# Patient Record
Sex: Male | Born: 1945
Health system: Southern US, Community
[De-identification: ages and names within clinical notes are randomized; demographics above are authoritative.]

## PROBLEM LIST (undated history)

## (undated) DIAGNOSIS — N529 Male erectile dysfunction, unspecified: Secondary | ICD-10-CM

## (undated) DIAGNOSIS — T8859XA Other complications of anesthesia, initial encounter: Secondary | ICD-10-CM

## (undated) DIAGNOSIS — E119 Type 2 diabetes mellitus without complications: Secondary | ICD-10-CM

## (undated) DIAGNOSIS — H548 Legal blindness, as defined in USA: Secondary | ICD-10-CM

## (undated) DIAGNOSIS — F32A Depression, unspecified: Secondary | ICD-10-CM

## (undated) DIAGNOSIS — K589 Irritable bowel syndrome without diarrhea: Secondary | ICD-10-CM

## (undated) DIAGNOSIS — T4145XA Adverse effect of unspecified anesthetic, initial encounter: Secondary | ICD-10-CM

## (undated) DIAGNOSIS — I10 Essential (primary) hypertension: Secondary | ICD-10-CM

## (undated) DIAGNOSIS — R3912 Poor urinary stream: Secondary | ICD-10-CM

## (undated) DIAGNOSIS — Z8549 Personal history of malignant neoplasm of other male genital organs: Secondary | ICD-10-CM

## (undated) DIAGNOSIS — N4289 Other specified disorders of prostate: Secondary | ICD-10-CM

## (undated) DIAGNOSIS — F329 Major depressive disorder, single episode, unspecified: Secondary | ICD-10-CM

## (undated) DIAGNOSIS — F319 Bipolar disorder, unspecified: Secondary | ICD-10-CM

## (undated) DIAGNOSIS — F419 Anxiety disorder, unspecified: Secondary | ICD-10-CM

## (undated) DIAGNOSIS — H9190 Unspecified hearing loss, unspecified ear: Secondary | ICD-10-CM

## (undated) DIAGNOSIS — N401 Enlarged prostate with lower urinary tract symptoms: Secondary | ICD-10-CM

## (undated) HISTORY — PX: TOTAL KNEE ARTHROPLASTY: SHX125

## (undated) HISTORY — PX: NASAL SEPTUM SURGERY: SHX37

## (undated) HISTORY — PX: CHOLECYSTECTOMY: SHX55

## (undated) HISTORY — PX: SHOULDER ARTHROSCOPY WITH ROTATOR CUFF REPAIR: SHX5685

## (undated) HISTORY — PX: CERVICAL FUSION: SHX112

---

## 2005-02-26 HISTORY — PX: CATARACT EXTRACTION W/ INTRAOCULAR LENS  IMPLANT, BILATERAL: SHX1307

## 2011-02-27 HISTORY — PX: OTHER SURGICAL HISTORY: SHX169

## 2014-08-10 ENCOUNTER — Encounter (HOSPITAL_COMMUNITY): Payer: Self-pay | Admitting: *Deleted

## 2014-08-10 ENCOUNTER — Emergency Department (HOSPITAL_COMMUNITY): Payer: Medicare PPO

## 2014-08-10 ENCOUNTER — Inpatient Hospital Stay (HOSPITAL_COMMUNITY)
Admission: EM | Admit: 2014-08-10 | Discharge: 2014-08-18 | DRG: 853 | Disposition: A | Discharge status: Home / Self Care | Payer: Medicare PPO | Attending: Surgery | Admitting: Surgery

## 2014-08-10 DIAGNOSIS — D62 Acute posthemorrhagic anemia: Secondary | ICD-10-CM | POA: Diagnosis not present

## 2014-08-10 DIAGNOSIS — F319 Bipolar disorder, unspecified: Secondary | ICD-10-CM | POA: Diagnosis present

## 2014-08-10 DIAGNOSIS — I471 Supraventricular tachycardia: Secondary | ICD-10-CM | POA: Diagnosis not present

## 2014-08-10 DIAGNOSIS — J869 Pyothorax without fistula: Secondary | ICD-10-CM | POA: Diagnosis present

## 2014-08-10 DIAGNOSIS — J90 Pleural effusion, not elsewhere classified: Secondary | ICD-10-CM | POA: Diagnosis present

## 2014-08-10 DIAGNOSIS — J189 Pneumonia, unspecified organism: Secondary | ICD-10-CM | POA: Diagnosis present

## 2014-08-10 DIAGNOSIS — Z91041 Radiographic dye allergy status: Secondary | ICD-10-CM

## 2014-08-10 DIAGNOSIS — G8929 Other chronic pain: Secondary | ICD-10-CM | POA: Diagnosis present

## 2014-08-10 DIAGNOSIS — Z9889 Other specified postprocedural states: Secondary | ICD-10-CM

## 2014-08-10 DIAGNOSIS — I10 Essential (primary) hypertension: Secondary | ICD-10-CM | POA: Diagnosis present

## 2014-08-10 DIAGNOSIS — E222 Syndrome of inappropriate secretion of antidiuretic hormone: Secondary | ICD-10-CM | POA: Diagnosis present

## 2014-08-10 DIAGNOSIS — Z888 Allergy status to other drugs, medicaments and biological substances status: Secondary | ICD-10-CM | POA: Diagnosis not present

## 2014-08-10 DIAGNOSIS — G9340 Encephalopathy, unspecified: Secondary | ICD-10-CM | POA: Diagnosis present

## 2014-08-10 DIAGNOSIS — A419 Sepsis, unspecified organism: Secondary | ICD-10-CM | POA: Diagnosis present

## 2014-08-10 DIAGNOSIS — E11649 Type 2 diabetes mellitus with hypoglycemia without coma: Secondary | ICD-10-CM | POA: Diagnosis not present

## 2014-08-10 DIAGNOSIS — R109 Unspecified abdominal pain: Secondary | ICD-10-CM

## 2014-08-10 DIAGNOSIS — M25512 Pain in left shoulder: Secondary | ICD-10-CM | POA: Diagnosis not present

## 2014-08-10 DIAGNOSIS — J948 Other specified pleural conditions: Secondary | ICD-10-CM | POA: Diagnosis not present

## 2014-08-10 DIAGNOSIS — Z87891 Personal history of nicotine dependence: Secondary | ICD-10-CM | POA: Diagnosis not present

## 2014-08-10 DIAGNOSIS — G629 Polyneuropathy, unspecified: Secondary | ICD-10-CM | POA: Diagnosis present

## 2014-08-10 DIAGNOSIS — E119 Type 2 diabetes mellitus without complications: Secondary | ICD-10-CM | POA: Diagnosis not present

## 2014-08-10 DIAGNOSIS — Z9689 Presence of other specified functional implants: Secondary | ICD-10-CM

## 2014-08-10 DIAGNOSIS — Z79899 Other long term (current) drug therapy: Secondary | ICD-10-CM | POA: Diagnosis not present

## 2014-08-10 DIAGNOSIS — E1165 Type 2 diabetes mellitus with hyperglycemia: Secondary | ICD-10-CM | POA: Diagnosis present

## 2014-08-10 DIAGNOSIS — I4891 Unspecified atrial fibrillation: Secondary | ICD-10-CM | POA: Diagnosis present

## 2014-08-10 HISTORY — DX: Essential (primary) hypertension: I10

## 2014-08-10 HISTORY — DX: Bipolar disorder, unspecified: F31.9

## 2014-08-10 LAB — HEPATIC FUNCTION PANEL
ALBUMIN: 2.8 g/dL — AB (ref 3.5–5.0)
ALT: 20 U/L (ref 17–63)
AST: 16 U/L (ref 15–41)
Alkaline Phosphatase: 109 U/L (ref 38–126)
BILIRUBIN TOTAL: 0.5 mg/dL (ref 0.3–1.2)
Bilirubin, Direct: 0.3 mg/dL (ref 0.1–0.5)
Indirect Bilirubin: 0.2 mg/dL — ABNORMAL LOW (ref 0.3–0.9)
TOTAL PROTEIN: 6.6 g/dL (ref 6.5–8.1)

## 2014-08-10 LAB — BASIC METABOLIC PANEL
Anion gap: 10 (ref 5–15)
BUN: 22 mg/dL — AB (ref 6–20)
CHLORIDE: 96 mmol/L — AB (ref 101–111)
CO2: 20 mmol/L — ABNORMAL LOW (ref 22–32)
Calcium: 9 mg/dL (ref 8.9–10.3)
Creatinine, Ser: 1.11 mg/dL (ref 0.61–1.24)
GFR calc non Af Amer: >60 mL/min (ref >60)
Glucose, Bld: 408 mg/dL — ABNORMAL HIGH (ref 65–99)
POTASSIUM: 4.6 mmol/L (ref 3.5–5.1)
SODIUM: 126 mmol/L — AB (ref 135–145)

## 2014-08-10 LAB — I-STAT TROPONIN, ED: Troponin i, poc: 0.01 ng/mL (ref 0.00–0.08)

## 2014-08-10 LAB — CBC
HCT: 30.7 % — ABNORMAL LOW (ref 39.0–52.0)
Hemoglobin: 10.7 g/dL — ABNORMAL LOW (ref 13.0–17.0)
MCH: 28.6 pg (ref 26.0–34.0)
MCHC: 34.9 g/dL (ref 30.0–36.0)
MCV: 82.1 fL (ref 78.0–100.0)
PLATELETS: 300 10*3/uL (ref 150–400)
RBC: 3.74 MIL/uL — ABNORMAL LOW (ref 4.22–5.81)
RDW: 12.6 % (ref 11.5–15.5)
WBC: 27.4 10*3/uL — AB (ref 4.0–10.5)

## 2014-08-10 LAB — STREP PNEUMONIAE URINARY ANTIGEN: Strep Pneumo Urinary Antigen: NEGATIVE

## 2014-08-10 LAB — LIPASE, BLOOD: Lipase: 14 U/L — ABNORMAL LOW (ref 22–51)

## 2014-08-10 LAB — BRAIN NATRIURETIC PEPTIDE: B Natriuretic Peptide: 198.1 pg/mL — ABNORMAL HIGH (ref 0.0–100.0)

## 2014-08-10 LAB — CBG MONITORING, ED
GLUCOSE-CAPILLARY: 350 mg/dL — AB (ref 65–99)
GLUCOSE-CAPILLARY: 384 mg/dL — AB (ref 65–99)

## 2014-08-10 LAB — LACTIC ACID, PLASMA: Lactic Acid, Venous: 1.2 mmol/L (ref 0.5–2.0)

## 2014-08-10 LAB — GLUCOSE, CAPILLARY: Glucose-Capillary: 375 mg/dL — ABNORMAL HIGH (ref 65–99)

## 2014-08-10 MED ORDER — ACETAMINOPHEN 325 MG PO TABS
650.0000 mg | ORAL_TABLET | Freq: Four times a day (QID) | ORAL | Status: DC | PRN
  Administered 2014-08-11 (×2): 650 mg via ORAL
  Filled 2014-08-10 (×2): qty 2

## 2014-08-10 MED ORDER — DEXTROSE 5 % IV SOLN
500.0000 mg | Freq: Once | INTRAVENOUS | Status: AC
  Administered 2014-08-10: 500 mg via INTRAVENOUS
  Filled 2014-08-10: qty 500

## 2014-08-10 MED ORDER — DEXTROSE 5 % IV SOLN
1.0000 g | Freq: Once | INTRAVENOUS | Status: AC
  Administered 2014-08-10: 1 g via INTRAVENOUS
  Filled 2014-08-10: qty 10

## 2014-08-10 MED ORDER — ONDANSETRON HCL 4 MG/2ML IJ SOLN: 4.0000 mg | Freq: Four times a day (QID) | INTRAMUSCULAR | Status: DC | PRN

## 2014-08-10 MED ORDER — GABAPENTIN 300 MG PO CAPS: 300.0000 mg | ORAL_CAPSULE | Freq: Two times a day (BID) | ORAL | Status: DC

## 2014-08-10 MED ORDER — ONDANSETRON HCL 4 MG PO TABS: 4.0000 mg | ORAL_TABLET | Freq: Four times a day (QID) | ORAL | Status: DC | PRN

## 2014-08-10 MED ORDER — ALBUTEROL SULFATE (2.5 MG/3ML) 0.083% IN NEBU
2.5000 mg | INHALATION_SOLUTION | Freq: Four times a day (QID) | RESPIRATORY_TRACT | Status: DC | PRN
  Administered 2014-08-12: 2.5 mg via RESPIRATORY_TRACT
  Filled 2014-08-10: qty 3

## 2014-08-10 MED ORDER — HYDROMORPHONE HCL 1 MG/ML IJ SOLN
1.0000 mg | Freq: Once | INTRAMUSCULAR | Status: AC
  Administered 2014-08-10: 1 mg via INTRAVENOUS
  Filled 2014-08-10: qty 1

## 2014-08-10 MED ORDER — TIZANIDINE HCL 2 MG PO TABS
2.0000 mg | ORAL_TABLET | Freq: Three times a day (TID) | ORAL | Status: DC | PRN
  Administered 2014-08-15: 2 mg via ORAL
  Filled 2014-08-10 (×4): qty 2

## 2014-08-10 MED ORDER — GABAPENTIN 300 MG PO CAPS
300.0000 mg | ORAL_CAPSULE | Freq: Every day | ORAL | Status: DC
  Administered 2014-08-11 – 2014-08-18 (×7): 300 mg via ORAL
  Filled 2014-08-10 (×7): qty 1

## 2014-08-10 MED ORDER — DEXTROSE 5 % IV SOLN: 500.0000 mg | INTRAVENOUS | Status: DC

## 2014-08-10 MED ORDER — INSULIN ASPART 100 UNIT/ML ~~LOC~~ SOLN
0.0000 [IU] | Freq: Three times a day (TID) | SUBCUTANEOUS | Status: DC
  Administered 2014-08-11 (×2): 5 [IU] via SUBCUTANEOUS

## 2014-08-10 MED ORDER — GABAPENTIN 300 MG PO CAPS
600.0000 mg | ORAL_CAPSULE | Freq: Every day | ORAL | Status: DC
  Administered 2014-08-10 – 2014-08-17 (×7): 600 mg via ORAL
  Filled 2014-08-10 (×9): qty 2

## 2014-08-10 MED ORDER — SODIUM CHLORIDE 0.9 % IV BOLUS (SEPSIS)
1000.0000 mL | Freq: Once | INTRAVENOUS | Status: AC
  Administered 2014-08-10: 1000 mL via INTRAVENOUS

## 2014-08-10 MED ORDER — MORPHINE SULFATE 2 MG/ML IJ SOLN: 2.0000 mg | INTRAMUSCULAR | Status: DC | PRN

## 2014-08-10 MED ORDER — ACETAMINOPHEN 650 MG RE SUPP: 650.0000 mg | Freq: Four times a day (QID) | RECTAL | Status: DC | PRN

## 2014-08-10 MED ORDER — VENLAFAXINE HCL ER 150 MG PO CP24
300.0000 mg | ORAL_CAPSULE | Freq: Every day | ORAL | Status: DC
  Administered 2014-08-10 – 2014-08-17 (×7): 300 mg via ORAL
  Filled 2014-08-10 (×10): qty 2

## 2014-08-10 MED ORDER — DEXTROSE 5 % IV SOLN: 1.0000 g | INTRAVENOUS | Status: DC

## 2014-08-10 MED ORDER — OXYCODONE HCL ER 20 MG PO T12A
60.0000 mg | EXTENDED_RELEASE_TABLET | Freq: Two times a day (BID) | ORAL | Status: DC
Start: 2014-08-10 — End: 2014-08-12
  Administered 2014-08-10 – 2014-08-11 (×3): 60 mg via ORAL
  Filled 2014-08-10 (×3): qty 6

## 2014-08-10 MED ORDER — ALBUTEROL SULFATE HFA 108 (90 BASE) MCG/ACT IN AERS: 2.0000 | INHALATION_SPRAY | Freq: Four times a day (QID) | RESPIRATORY_TRACT | Status: DC | PRN

## 2014-08-10 MED ORDER — ACETAMINOPHEN 325 MG PO TABS
650.0000 mg | ORAL_TABLET | Freq: Once | ORAL | Status: AC
  Administered 2014-08-10: 650 mg via ORAL
  Filled 2014-08-10: qty 2

## 2014-08-10 MED ORDER — LOSARTAN POTASSIUM 25 MG PO TABS
25.0000 mg | ORAL_TABLET | Freq: Every day | ORAL | Status: DC
  Administered 2014-08-10 – 2014-08-11 (×2): 25 mg via ORAL
  Filled 2014-08-10 (×4): qty 1

## 2014-08-10 MED ORDER — INSULIN ASPART 100 UNIT/ML ~~LOC~~ SOLN
0.0000 [IU] | Freq: Every day | SUBCUTANEOUS | Status: DC
  Administered 2014-08-10: 5 [IU] via SUBCUTANEOUS
  Administered 2014-08-11: 2 [IU] via SUBCUTANEOUS

## 2014-08-10 MED ORDER — LAMOTRIGINE 200 MG PO TABS
200.0000 mg | ORAL_TABLET | Freq: Every day | ORAL | Status: DC
  Administered 2014-08-10 – 2014-08-17 (×7): 200 mg via ORAL
  Filled 2014-08-10 (×8): qty 1
  Filled 2014-08-10 (×2): qty 2

## 2014-08-10 MED ORDER — DEXTROSE 5 % IV SOLN
500.0000 mg | INTRAVENOUS | Status: DC
  Filled 2014-08-10: qty 500

## 2014-08-10 MED ORDER — METOPROLOL TARTRATE 25 MG PO TABS
25.0000 mg | ORAL_TABLET | Freq: Every day | ORAL | Status: DC
  Administered 2014-08-10 – 2014-08-11 (×2): 25 mg via ORAL
  Filled 2014-08-10 (×3): qty 1

## 2014-08-10 MED ORDER — DEXTROSE 5 % IV SOLN
1.0000 g | INTRAVENOUS | Status: DC
  Filled 2014-08-10: qty 10

## 2014-08-10 NOTE — ED Notes (Signed)
Patient in xray I will collect labs when he returns.

## 2014-08-10 NOTE — ED Notes (Signed)
Per EMS pt coming from home with c/o chest discomfort and cough. Pt sts it's been going on for about 2 weeks, and it hurts to cough. Pt was given 4 aspirin en route, pt's cbg was 403, per EMS pt unsure if he is taking his medications as prescribed.

## 2014-08-10 NOTE — ED Notes (Signed)
Bed: YY48 Expected date:  Expected time:  Means of arrival:  Comments: EMS- chest discomfort

## 2014-08-10 NOTE — Progress Notes (Signed)
Pt confirmed with cm he has [employer] coverage and Dr Abigail Miyamoto is his pcp

## 2014-08-10 NOTE — H&P (Addendum)
[facility] History and Physical  Dwayne Vargas EAV:[id number] DOB: Nov 23, 1945 DOA: 08/10/2014  Referring physician: Emergency Department PCP: Irving Copas, MD  Specialists:   Chief Complaint: SOB  HPI: Dwayne Vargas is a 69 y.o. male  With a hx of DM, bipolar, HTN, who presents to ED with complaints of sob, L sided CP as well as fevers over the past week. In the ED, the patient was found to have evidence of L sided pneumonia with large effusion on imaging. The patient was started on empiric rocephin and azithromycin and hospitalist consulted for consideration for admission.  Review of Systems:   Review of Systems  Constitutional: Positive for fever. Negative for malaise/fatigue and diaphoresis.  HENT: Negative for ear pain and nosebleeds.   Eyes: Negative for double vision and discharge.  Respiratory: Positive for cough and shortness of breath.   Cardiovascular: Positive for chest pain. Negative for palpitations and claudication.  Gastrointestinal: Positive for abdominal pain. Negative for nausea and blood in stool.  Genitourinary: Negative for frequency and flank pain.  Musculoskeletal: Negative for falls and neck pain.  Neurological: Negative for tremors, seizures, loss of consciousness and headaches.  Psychiatric/Behavioral: Negative for hallucinations. The patient is not nervous/anxious.      Past Medical History  Diagnosis Date  . Diabetes mellitus without complication   . Bipolar 1 disorder   . Hypertension    Past Surgical History  Procedure Laterality Date  . Total knee arthroplasty    . Shoulder surgery    . Cholecystectomy    . Fracture surgery    . Joint replacement     Social History:  reports that he quit smoking about 3 months ago. His smoking use included Cigarettes. He has never used smokeless tobacco. He reports that he does not drink alcohol or use illicit drugs.  where does patient live--home, ALF, SNF? and with whom if at home?  Can patient  participate in ADLs?  Allergies  Allergen Reactions  . Iodine Hives and Swelling    History reviewed. No pertinent family history. Father with renal failure   Prior to Admission medications   Medication Sig Start Date End Date Taking? Authorizing Provider  gabapentin (NEURONTIN) 300 MG capsule Take 300-600 mg by mouth 2 (two) times daily. 300 mg every morning and 600 mg every night 07/28/14  Yes Historical Provider, MD  lamoTRIgine (LAMICTAL) 200 MG tablet Take 200 mg by mouth at bedtime. 07/29/14  Yes Historical Provider, MD  losartan (COZAAR) 25 MG tablet Take 25 mg by mouth daily. 08/10/14  Yes Historical Provider, MD  lovastatin (MEVACOR) 10 MG tablet Take 10 mg by mouth daily. 08/10/14  Yes Historical Provider, MD  metFORMIN (GLUCOPHAGE) 500 MG tablet Take 500 mg by mouth 2 (two) times daily. 08/10/14  Yes Historical Provider, MD  metoprolol tartrate (LOPRESSOR) 25 MG tablet Take 25 mg by mouth daily. 08/10/14  Yes Historical Provider, MD  OXYCONTIN 30 MG T12A Take 60 mg by mouth every 12 (twelve) hours as needed (pain).  07/20/14  Yes Historical Provider, MD  PROAIR HFA 108 (90 BASE) MCG/ACT inhaler Inhale 2 puffs into the lungs 4 (four) times daily as needed for wheezing or shortness of breath.  08/08/14  Yes Historical Provider, MD  tiZANidine (ZANAFLEX) 2 MG tablet Take 2-4 mg by mouth 3 (three) times daily as needed for muscle spasms.  07/28/14  Yes Historical Provider, MD  venlafaxine XR (EFFEXOR-XR) 150 MG 24 hr capsule Take 300 mg by mouth daily. 07/29/14  Yes Historical  Provider, MD   Physical Exam: Filed Vitals:   08/10/14 1330 08/10/14 1430 08/10/14 1432 08/10/14 1500  BP: 108/48 111/51  125/60  Pulse: 98 98  94  Temp:   99.9 F (37.7 C)   TempSrc:   Oral   Resp: SpO2: 92% 92%  93%     General:  Awake, in nad  Eyes: PERRL B  ENT: membranes moist, dentition fair  Neck: trachea midline, neck supple  Cardiovascular: regular, s1, s2  Respiratory: normal resp  effort, no wheezing  Abdomen: soft,nondistended  Skin: normal skin turgor, no abnormal skin lesions seen  Musculoskeletal: perfused, no clubbing  Psychiatric: mood/affect normal // no auditory/visual hallucinations   Neurologic: cn2-12 grossly intact, strength/sensation intact  Labs on Admission:  Basic Metabolic Panel:  Recent Labs Lab 08/10/14 1328  NA 126*  Vargas 4.6  CL 96*  CO2 20*  GLUCOSE 408*  BUN 22*  CREATININE 1.11  CALCIUM 9.0   Liver Function Tests:  Recent Labs Lab 08/10/14 1435  AST 16  ALT 20  ALKPHOS 109  BILITOT 0.5  PROT 6.6  ALBUMIN 2.8*    Recent Labs Lab 08/10/14 1435  LIPASE 14*   No results for input(s): AMMONIA in the last 168 hours. CBC:  Recent Labs Lab 08/10/14 1328  WBC 27.4*  HGB 10.7*  HCT 30.7*  MCV 82.1  PLT 300   Cardiac Enzymes: No results for input(s): CKTOTAL, CKMB, CKMBINDEX, TROPONINI in the last 168 hours.  BNP (last 3 results)  Recent Labs  08/10/14 1320  BNP 198.1*    ProBNP (last 3 results) No results for input(s): PROBNP in the last 8760 hours.  CBG: No results for input(s): GLUCAP in the last 168 hours.  Radiological Exams on Admission: Ct Abdomen Pelvis Wo Contrast  08/10/2014   CLINICAL DATA:  69 year old male with chest pain cough and left upper quadrant pain. Initial encounter.  EXAM: CT ABDOMEN AND PELVIS WITHOUT CONTRAST  TECHNIQUE: Multidetector CT imaging of the abdomen and pelvis was performed following the standard protocol without IV contrast.  COMPARISON:  Chest radiographs today reported separately.  FINDINGS: Partially visible left lower lobe and lingula consolidation with air bronchograms and superimposed pleural effusion. The effusion extends laterally around the compressed lung parenchyma. No pericardial effusion. No definite cavitary changes.  Mild curvilinear opacity in the right medial lung base, azygoesophageal recess. No right pleural effusion.  No acute or suspicious osseous  lesion identified.  No pelvic free fluid. Mildly distended bladder. Negative distal colon.  Redundant sigmoid colon mostly containing gas. Retained stool in the left colon and transverse colon. Retained stool at the hepatic flexure and ascending colon. Negative appendix and terminal ileum. No dilated small bowel. Oral contrast has not yet reached the distal small bowel. Negative stomach and duodenum.  Surgically absent gallbladder. Non contrast liver, spleen, pancreas and adrenal glands are within normal limits. No abdominal free fluid. No hydronephrosis or hydroureter. Punctate left lower pole renal calculus. Mild nonspecific perinephric stranding. Right lower pole exophytic cyst with simple fluid densitometry. Aortoiliac calcified atherosclerosis noted. No lymphadenopathy in the abdomen or pelvis.  IMPRESSION: 1. Abnormal visualized left lower lobe and lingula with a combination of multilobar consolidation, and also compression of the left lung by a complex or loculated pleural effusion. This might all be related to pneumonia, but obstructing left hilar lung mass is also a consideration. Chest CT with IV contrast is recommended when possible to further characterize. 2. No acute or  inflammatory findings in the abdomen or pelvis.   Electronically Signed   By: Odessa Fleming M.D.   On: 08/10/2014 16:07   Dg Chest 2 View  08/10/2014   CLINICAL DATA:  Chest discomfort and cough for approximately 2 weeks. Elevated white blood cell count.  EXAM: CHEST  2 VIEW  COMPARISON:  None.  FINDINGS: The patient has a moderate to moderately large left pleural effusion with associated left basilar airspace disease. The right lung appears clear. No pneumothorax is identified. Heart size is normal.  IMPRESSION: Moderate to moderately large left pleural effusion with left basilar airspace disease worrisome for pneumonia given elevated white blood cell count. Follow-up to clearing is recommended.   Electronically Signed   By: Drusilla Kanner M.D.   On: 08/10/2014 13:51   Assessment/Plan Principal Problem:   Community acquired pneumonia Active Problems:   Diabetes mellitus without complication   Pleural effusion   HTN (hypertension)  1. CAP with largepleural effusion with sepsos 1. Elevated WBC of 27k with fevers to 101F in the setting of pneumonia 2. Lactate normal 3. Cont on empiric azithromycin and rocephin 4. Cont O2 as needed 5. F/u CT chest official results, however per my own read, L sided large effusion seen 6. Will request IR guided diagnostic and therapeutic thoracentesis 7. Will consult Pulmonary on arrival to [facility] 8. Admit to stepdown at [facility] 2. Large L sided pleural effusion 1. Pt with long hx of tobacco abuse, recently quit 3 mos ago 2. Denies unintentional wt loss 3. Presenting sx more suggestive of CAP 3. DM 1. Cont on SSI coverage 2. Poorly controlled with random glucose of 400 on presentation 3. Will check a1c 4. On metformin prior to admit 5. If no improvement in glucose, consider starting basal insulin 4. HTN 1. BP stable 2. Cont to monitor 5. Hx tobacco abuse 1. Pt quit tobacco 30mos ago, previously smoked "all my life" 2. Denies nicotine patch 6. Ane 7. DVT prophylaxis 1. SCD's  Code Status: Full  Family Communication: Pt in room  Disposition Plan: Admit to stepdown, [facility] Dwayne Vargas [facility] Pager [phone]  If 7PM-7AM, please contact night-coverage [email or web address] Password [facility] 08/10/2014, 4:36 PM

## 2014-08-10 NOTE — ED Provider Notes (Signed)
CSN: [id number]     Arrival date & time 08/10/14  1224 History   First MD Initiated Contact with Patient 08/10/14 1236     Chief Complaint  Patient presents with  . chest pain   . Cough  . Hyperglycemia      HPI Patient presents emergency department complaining of chest discomfort and cough as well as some left-sided abdominal pain over the past 7-10 days.  He reports some discomfort and pain when he coughs.  No fevers or chills but was found to have fever 101.4 on the emergency department arrival.  Reports nausea without vomiting.  Denies diarrhea.  Denies back pain.   Past Medical History  Diagnosis Date  . Diabetes mellitus without complication   . Bipolar 1 disorder   . Hypertension    Past Surgical History  Procedure Laterality Date  . Total knee arthroplasty    . Shoulder surgery    . Cholecystectomy    . Fracture surgery    . Joint replacement     No family history on file. History  Substance Use Topics  . Smoking status: Former Smoker    Types: Cigarettes    Quit date: 04/27/2014  . Smokeless tobacco: Not on file  . Alcohol Use: No    Review of Systems  All other systems reviewed and are negative.     Allergies  Iodine  Home Medications   Prior to Admission medications   Medication Sig Start Date End Date Taking? Authorizing Provider  gabapentin (NEURONTIN) 300 MG capsule Take 300-600 mg by mouth 2 (two) times daily. 300 mg every morning and 600 mg every night 07/28/14  Yes Historical Provider, MD  lamoTRIgine (LAMICTAL) 200 MG tablet Take 200 mg by mouth at bedtime. 07/29/14  Yes Historical Provider, MD  losartan (COZAAR) 25 MG tablet Take 25 mg by mouth daily. 08/10/14  Yes Historical Provider, MD  lovastatin (MEVACOR) 10 MG tablet Take 10 mg by mouth daily. 08/10/14  Yes Historical Provider, MD  metFORMIN (GLUCOPHAGE) 500 MG tablet Take 500 mg by mouth 2 (two) times daily. 08/10/14  Yes Historical Provider, MD  metoprolol tartrate (LOPRESSOR) 25 MG tablet  Take 25 mg by mouth daily. 08/10/14  Yes Historical Provider, MD  OXYCONTIN 30 MG T12A Take 60 mg by mouth every 12 (twelve) hours as needed (pain).  07/20/14  Yes Historical Provider, MD  PROAIR HFA 108 (90 BASE) MCG/ACT inhaler Inhale 2 puffs into the lungs 4 (four) times daily as needed for wheezing or shortness of breath.  08/08/14  Yes Historical Provider, MD  tiZANidine (ZANAFLEX) 2 MG tablet Take 2-4 mg by mouth 3 (three) times daily as needed for muscle spasms.  07/28/14  Yes Historical Provider, MD  venlafaxine XR (EFFEXOR-XR) 150 MG 24 hr capsule Take 300 mg by mouth daily. 07/29/14  Yes Historical Provider, MD   BP 92/58 mmHg  Pulse 97  Temp(Src) 101.4 F (38.6 C) (Oral)  Resp 19  SpO2 93% Physical Exam  Constitutional: He is oriented to person, place, and time. He appears well-developed and well-nourished.  HENT:  Head: Normocephalic and atraumatic.  Eyes: EOM are normal.  Neck: Normal range of motion.  Cardiovascular: Normal rate, regular rhythm, normal heart sounds and intact distal pulses.   Pulmonary/Chest: Effort normal and breath sounds normal. No respiratory distress.  Abdominal: Soft. He exhibits no distension.  Left-sided abdominal tenderness without guarding or rebound.  Musculoskeletal: Normal range of motion.  Neurological: He is alert and oriented to person,  place, and time.  Skin: Skin is warm and dry.  Psychiatric: He has a normal mood and affect. Judgment normal.  Nursing note and vitals reviewed.   ED Course  Procedures (including critical care time) Labs Review Labs Reviewed  CBC - Abnormal; Notable for the following:    WBC 27.4 (*)    RBC 3.74 (*)    Hemoglobin 10.7 (*)    HCT 30.7 (*)    All other components within normal limits  BASIC METABOLIC PANEL - Abnormal; Notable for the following:    Sodium 126 (*)    Chloride 96 (*)    CO2 20 (*)    Glucose, Bld 408 (*)    BUN 22 (*)    All other components within normal limits  BRAIN NATRIURETIC  PEPTIDE - Abnormal; Notable for the following:    B Natriuretic Peptide 198.1 (*)    All other components within normal limits  CULTURE, BLOOD (ROUTINE X 2)  CULTURE, BLOOD (ROUTINE X 2)  LACTIC ACID, PLASMA  LIPASE, BLOOD  HEPATIC FUNCTION PANEL  I-STAT TROPOININ, ED    Imaging Review Ct Abdomen Pelvis Wo Contrast  08/10/2014   CLINICAL DATA:  69 year old male with chest pain cough and left upper quadrant pain. Initial encounter.  EXAM: CT ABDOMEN AND PELVIS WITHOUT CONTRAST  TECHNIQUE: Multidetector CT imaging of the abdomen and pelvis was performed following the standard protocol without IV contrast.  COMPARISON:  Chest radiographs today reported separately.  FINDINGS: Partially visible left lower lobe and lingula consolidation with air bronchograms and superimposed pleural effusion. The effusion extends laterally around the compressed lung parenchyma. No pericardial effusion. No definite cavitary changes.  Mild curvilinear opacity in the right medial lung base, azygoesophageal recess. No right pleural effusion.  No acute or suspicious osseous lesion identified.  No pelvic free fluid. Mildly distended bladder. Negative distal colon.  Redundant sigmoid colon mostly containing gas. Retained stool in the left colon and transverse colon. Retained stool at the hepatic flexure and ascending colon. Negative appendix and terminal ileum. No dilated small bowel. Oral contrast has not yet reached the distal small bowel. Negative stomach and duodenum.  Surgically absent gallbladder. Non contrast liver, spleen, pancreas and adrenal glands are within normal limits. No abdominal free fluid. No hydronephrosis or hydroureter. Punctate left lower pole renal calculus. Mild nonspecific perinephric stranding. Right lower pole exophytic cyst with simple fluid densitometry. Aortoiliac calcified atherosclerosis noted. No lymphadenopathy in the abdomen or pelvis.  IMPRESSION: 1. Abnormal visualized left lower lobe and  lingula with a combination of multilobar consolidation, and also compression of the left lung by a complex or loculated pleural effusion. This might all be related to pneumonia, but obstructing left hilar lung mass is also a consideration. Chest CT with IV contrast is recommended when possible to further characterize. 2. No acute or inflammatory findings in the abdomen or pelvis.   Electronically Signed   By: Odessa Fleming M.D.   On: 08/10/2014 16:07   Dg Chest 2 View  08/10/2014   CLINICAL DATA:  Chest discomfort and cough for approximately 2 weeks. Elevated white blood cell count.  EXAM: CHEST  2 VIEW  COMPARISON:  None.  FINDINGS: The patient has a moderate to moderately large left pleural effusion with associated left basilar airspace disease. The right lung appears clear. No pneumothorax is identified. Heart size is normal.  IMPRESSION: Moderate to moderately large left pleural effusion with left basilar airspace disease worrisome for pneumonia given elevated white blood cell count.  Follow-up to clearing is recommended.   Electronically Signed   By: Drusilla Kanner M.D.   On: 08/10/2014 13:51  I personally reviewed the imaging tests through PACS system I reviewed available ER/hospitalization records through the EMR    EKG Interpretation None      MDM   Final diagnoses:  CAP (community acquired pneumonia)    Patient with large effusion and suspected pneumonia of the left lung.  Given his fever the patient be admitted the hospital for IV antibiotics.  CT scan chest is pending at this time.  CT scan of his abdomen pelvis is without abdominal pathology.  He has an IV dye allergy therefore no IV dye will be given.    Azalia Bilis, MD 08/10/14 1620

## 2014-08-11 ENCOUNTER — Inpatient Hospital Stay (HOSPITAL_COMMUNITY): Payer: Medicare PPO

## 2014-08-11 ENCOUNTER — Other Ambulatory Visit: Payer: Self-pay

## 2014-08-11 ENCOUNTER — Encounter (HOSPITAL_COMMUNITY): Payer: Self-pay | Admitting: Pulmonary Disease

## 2014-08-11 DIAGNOSIS — J869 Pyothorax without fistula: Secondary | ICD-10-CM

## 2014-08-11 DIAGNOSIS — E1165 Type 2 diabetes mellitus with hyperglycemia: Secondary | ICD-10-CM

## 2014-08-11 DIAGNOSIS — J948 Other specified pleural conditions: Secondary | ICD-10-CM

## 2014-08-11 LAB — PROTEIN, BODY FLUID: TOTAL PROTEIN, FLUID: 4.4 g/dL

## 2014-08-11 LAB — LEGIONELLA ANTIGEN, URINE

## 2014-08-11 LAB — LACTATE DEHYDROGENASE, PLEURAL OR PERITONEAL FLUID: LD, Fluid: 946 U/L — ABNORMAL HIGH (ref 3–23)

## 2014-08-11 LAB — GRAM STAIN

## 2014-08-11 LAB — URINALYSIS, ROUTINE W REFLEX MICROSCOPIC
Glucose, UA: 250 mg/dL — AB
KETONES UR: NEGATIVE mg/dL
Leukocytes, UA: NEGATIVE
NITRITE: NEGATIVE
PH: 5.5 (ref 5.0–8.0)
PROTEIN: 30 mg/dL — AB
Specific Gravity, Urine: 1.019 (ref 1.005–1.030)
Urobilinogen, UA: 1 mg/dL (ref 0.0–1.0)

## 2014-08-11 LAB — CBC
HCT: 31.6 % — ABNORMAL LOW (ref 39.0–52.0)
HEMOGLOBIN: 10.8 g/dL — AB (ref 13.0–17.0)
MCH: 28.5 pg (ref 26.0–34.0)
MCHC: 34.2 g/dL (ref 30.0–36.0)
MCV: 83.4 fL (ref 78.0–100.0)
Platelets: 328 10*3/uL (ref 150–400)
RBC: 3.79 MIL/uL — AB (ref 4.22–5.81)
RDW: 13 % (ref 11.5–15.5)
WBC: 36.4 10*3/uL — AB (ref 4.0–10.5)

## 2014-08-11 LAB — PROCALCITONIN: PROCALCITONIN: 3.55 ng/mL

## 2014-08-11 LAB — BASIC METABOLIC PANEL
ANION GAP: 8 (ref 5–15)
BUN: 14 mg/dL (ref 6–20)
CHLORIDE: 98 mmol/L — AB (ref 101–111)
CO2: 23 mmol/L (ref 22–32)
Calcium: 9.1 mg/dL (ref 8.9–10.3)
Creatinine, Ser: 1.03 mg/dL (ref 0.61–1.24)
GFR calc Af Amer: >60 mL/min (ref >60)
GFR calc non Af Amer: >60 mL/min (ref >60)
Glucose, Bld: 274 mg/dL — ABNORMAL HIGH (ref 65–99)
POTASSIUM: 4.5 mmol/L (ref 3.5–5.1)
Sodium: 129 mmol/L — ABNORMAL LOW (ref 135–145)

## 2014-08-11 LAB — BODY FLUID CELL COUNT WITH DIFFERENTIAL
Lymphs, Fluid: 7 %
Monocyte-Macrophage-Serous Fluid: 2 % — ABNORMAL LOW (ref 50–90)
Neutrophil Count, Fluid: 91 % — ABNORMAL HIGH (ref 0–25)
Total Nucleated Cell Count, Fluid: 13805 cu mm — ABNORMAL HIGH (ref 0–1000)

## 2014-08-11 LAB — GLUCOSE, CAPILLARY
GLUCOSE-CAPILLARY: 248 mg/dL — AB (ref 65–99)
Glucose-Capillary: 230 mg/dL — ABNORMAL HIGH (ref 65–99)
Glucose-Capillary: 227 mg/dL — ABNORMAL HIGH (ref 65–99)
Glucose-Capillary: 227 mg/dL — ABNORMAL HIGH (ref 65–99)

## 2014-08-11 LAB — URINE MICROSCOPIC-ADD ON

## 2014-08-11 LAB — LACTATE DEHYDROGENASE: LDH: 105 U/L (ref 98–192)

## 2014-08-11 LAB — MRSA PCR SCREENING: MRSA by PCR: NEGATIVE

## 2014-08-11 LAB — HIV ANTIBODY (ROUTINE TESTING W REFLEX): HIV Screen 4th Generation wRfx: NONREACTIVE

## 2014-08-11 MED ORDER — INSULIN ASPART 100 UNIT/ML ~~LOC~~ SOLN: 0.0000 [IU] | Freq: Every day | SUBCUTANEOUS | Status: DC

## 2014-08-11 MED ORDER — VANCOMYCIN HCL IN DEXTROSE 750-5 MG/150ML-% IV SOLN
750.0000 mg | Freq: Two times a day (BID) | INTRAVENOUS | Status: DC
  Administered 2014-08-12 – 2014-08-15 (×6): 750 mg via INTRAVENOUS
  Filled 2014-08-11 (×8): qty 150

## 2014-08-11 MED ORDER — INSULIN GLARGINE 100 UNIT/ML ~~LOC~~ SOLN
10.0000 [IU] | Freq: Every day | SUBCUTANEOUS | Status: DC
  Administered 2014-08-11: 10 [IU] via SUBCUTANEOUS
  Filled 2014-08-11 (×2): qty 0.1

## 2014-08-11 MED ORDER — INSULIN ASPART 100 UNIT/ML ~~LOC~~ SOLN
0.0000 [IU] | Freq: Three times a day (TID) | SUBCUTANEOUS | Status: DC
  Administered 2014-08-11: 5 [IU] via SUBCUTANEOUS

## 2014-08-11 MED ORDER — LIDOCAINE HCL (PF) 1 % IJ SOLN
INTRAMUSCULAR | Status: AC
  Filled 2014-08-11: qty 10

## 2014-08-11 MED ORDER — SODIUM CHLORIDE 0.9 % IV SOLN
2000.0000 mg | Freq: Once | INTRAVENOUS | Status: AC
  Administered 2014-08-11: 2000 mg via INTRAVENOUS
  Filled 2014-08-11 (×2): qty 2000

## 2014-08-11 MED ORDER — PIPERACILLIN-TAZOBACTAM 3.375 G IVPB
3.3750 g | Freq: Three times a day (TID) | INTRAVENOUS | Status: DC
  Administered 2014-08-11 – 2014-08-17 (×18): 3.375 g via INTRAVENOUS
  Filled 2014-08-11 (×24): qty 50

## 2014-08-11 NOTE — Progress Notes (Signed)
Inpatient Diabetes Program Recommendations  AACE/ADA: New Consensus Statement on Inpatient Glycemic Control (2013)  Target Ranges:  Prepandial:   less than 140 mg/dL      Peak postprandial:   less than 180 mg/dL (1-2 hours)      Critically ill patients:  140 - 180 mg/dL    Results for JORON, CARLOUGH (MRN [id number]) as of 08/11/2014 11:21  Ref. Range 08/10/2014 18:57 08/10/2014 21:10 08/10/2014 22:01 08/11/2014 07:23  Glucose-Capillary Latest Ref Range: 65-99 mg/dL 662 (H) 947 (H) 654 (H) 230 (H)   Reason for Admission: Left sided PNA  Diabetes history: DM 2 Outpatient Diabetes medications: Metformin 500 mg BID Current orders for Inpatient glycemic control: Novolog Moderate and HS scale  Inpatient Diabetes Program Recommendations Insulin - Basal: Fasting glucose 230 mg/dl. Please consider Lantus 12 units Q24hrs (this is 0.1 units/kg). HgbA1C: A1c not pending. Please consider ordering an A1c to evaluate glucose trends over the past 2-3 months.  Thanks,  Christena Deem RN, MSN, [facility] Inpatient Diabetes Coordinator Team Pager [phone]

## 2014-08-11 NOTE — Progress Notes (Signed)
ANTIBIOTIC CONSULT NOTE - INITIAL  Pharmacy Consult for Zosyn, Vancomycin  Indication: pneumonia  Allergies  Allergen Reactions  . Iodine Hives and Swelling  . Morphine And Related     Tremors     Patient Measurements: Height: 5\' 11"  (180.3 cm) Weight: 235 lb 12.8 oz (106.958 kg) IBW/kg (Calculated) : 75.3  Vital Signs: Temp: 98.4 F (36.9 C) (06/15 0824) Temp Source: Oral (06/15 0824) BP: 103/55 mmHg (06/15 1144) Pulse Rate: 97 (06/15 0824) Intake/Output from previous day: 06/14 0701 - 06/15 0700 In: -  Out: 600 [Urine:600] Intake/Output from this shift: Total I/O In: 240 [P.O.:240] Out: 400 [Stool:400]  Labs:  Recent Labs  08/10/14 1328  WBC 27.4*  HGB 10.7*  PLT 300  CREATININE 1.11   Estimated Creatinine Clearance: 78.2 mL/min (by C-G formula based on Cr of 1.11). No results for input(s): VANCOTROUGH, VANCOPEAK, VANCORANDOM, GENTTROUGH, GENTPEAK, GENTRANDOM, TOBRATROUGH, TOBRAPEAK, TOBRARND, AMIKACINPEAK, AMIKACINTROU, AMIKACIN in the last 72 hours.   Microbiology: Recent Results (from the past 720 hour(s))  Blood culture (routine x 2)     Status: None (Preliminary result)   Collection Time: 08/10/14  2:04 PM  Result Value Ref Range Status   Specimen Description BLOOD RIGHT HAND  Final   Special Requests BOTTLES DRAWN AEROBIC AND ANAEROBIC  5 CC EA  Final   Culture   Final           BLOOD CULTURE RECEIVED NO GROWTH TO DATE CULTURE WILL BE HELD FOR 5 DAYS BEFORE ISSUING A FINAL NEGATIVE REPORT Performed at [facility]    Report Status PENDING  Incomplete  Blood culture (routine x 2)     Status: None (Preliminary result)   Collection Time: 08/10/14  2:05 PM  Result Value Ref Range Status   Specimen Description BLOOD LEFT ANTECUBITAL  Final   Special Requests BOTTLES DRAWN AEROBIC AND ANAEROBIC 5 CC EA  Final   Culture   Final           BLOOD CULTURE RECEIVED NO GROWTH TO DATE CULTURE WILL BE HELD FOR 5 DAYS BEFORE ISSUING A FINAL NEGATIVE  REPORT Performed at [facility]    Report Status PENDING  Incomplete  MRSA PCR Screening     Status: None   Collection Time: 08/10/14 10:09 PM  Result Value Ref Range Status   MRSA by PCR NEGATIVE NEGATIVE Final    Comment:        The GeneXpert MRSA Assay (FDA approved for NASAL specimens only), is one component of a comprehensive MRSA colonization surveillance program. It is not intended to diagnose MRSA infection nor to guide or monitor treatment for MRSA infections.   Body fluid culture     Status: None (Preliminary result)   Collection Time: 08/11/14 11:25 AM  Result Value Ref Range Status   Specimen Description FLUID LEFT PLEURAL  Final   Special Requests US THORACENTESIS LEFT PLEURAL EFFUSION  Final   Gram Stain PENDING  Incomplete   Culture PENDING  Incomplete   Report Status PENDING  Incomplete    Medical History: Past Medical History  Diagnosis Date  . Diabetes mellitus without complication   . Bipolar 1 disorder   . Hypertension     Medications:  Prescriptions prior to admission  Medication Sig Dispense Refill Last Dose  . gabapentin (NEURONTIN) 300 MG capsule Take 300-600 mg by mouth 2 (two) times daily. 300 mg every morning and 600 mg every night  5 Past Week at Unknown time  .  lamoTRIgine (LAMICTAL) 200 MG tablet Take 200 mg by mouth at bedtime.  3 Past Week at Unknown time  . losartan (COZAAR) 25 MG tablet Take 25 mg by mouth daily.   Past Week at Unknown time  . lovastatin (MEVACOR) 10 MG tablet Take 10 mg by mouth daily.   Past Week at Unknown time  . metFORMIN (GLUCOPHAGE) 500 MG tablet Take 500 mg by mouth 2 (two) times daily.   Past Week at Unknown time  . metoprolol tartrate (LOPRESSOR) 25 MG tablet Take 25 mg by mouth daily.   Past Week at Unknown time  . OXYCONTIN 30 MG T12A Take 60 mg by mouth every 12 (twelve) hours as needed (pain).   0 Past Week at Unknown time  . PROAIR HFA 108 (90 BASE) MCG/ACT inhaler Inhale 2 puffs into the lungs 4  (four) times daily as needed for wheezing or shortness of breath.   0 Past Week at Unknown time  . tiZANidine (ZANAFLEX) 2 MG tablet Take 2-4 mg by mouth 3 (three) times daily as needed for muscle spasms.   1 Past Week at Unknown time  . venlafaxine XR (EFFEXOR-XR) 150 MG 24 hr capsule Take 300 mg by mouth daily.  3 Past Week at Unknown time   Assessment: 30 YOM who presented to the ED with complains of SOB, L sided CP as well as fevers over the past week. Noted L sided pneumonia with effusion on x-ray. Switched from azith/ceftriaxone to Zosyn/Vanc for HCAP due to recent hospitalization. WBC elevated at 27.4, Tmax 101.35F   6/15 Body fluid>> 6/15 BCx x2>>   Goal of Therapy:  Vancomycin trough level 15-20 mcg/ml  Plan:  -Vancomycin 2 gm IV once followed by 750 mg IV Q 12 hours -Zosyn 3.375 gm IV Q 8 hours -Monitor CBC, renal fx, cultures and clinical progress -VT at [facility]   Vinnie Level, PharmD., BCPS Clinical Pharmacist Pager [phone]

## 2014-08-11 NOTE — Consult Note (Signed)
[address]       [place] [address]             [phone]      Cardiothoracic Surgery Consultation   Reason for Consult: left empyema Referring Physician: Dr. Roselie Awkward  Dwayne Vargas is an 69 y.o. male.  HPI:  The patient is a 69 year old diabetic with a long history of smoking until a few months ago who had neck surgery at [facility] in May 2016. He was at home doing well until last Thursday when he began having left sided chest pain, cough, shortness of breath and fever. He felt bad over the weekend and came to the ER yesterday where he had a temp of 101.4 and WBC ct of 27K. CT scan of the chest showed a large loculated left pleural effusion and left lung pneumonia. He had a thoracentesis by IR and only 100 cc of turbid fluid could be removed. WBC ct in the pleural fluid was 13805, 91% neutrophils, with no organisms seen on gram stain. His peripheral WBC ct today was further elevated to 36.4.  Past Medical History  Diagnosis Date  . Diabetes mellitus without complication   . Bipolar 1 disorder   . Hypertension     Past Surgical History  Procedure Laterality Date  . Total knee arthroplasty    . Shoulder surgery    . Cholecystectomy    . Fracture surgery    . Joint replacement      History reviewed. No pertinent family history.  Social History:  reports that he quit smoking about 3 months ago. His smoking use included Cigarettes. He has a 53 pack-year smoking history. He has never used smokeless tobacco. He reports that he does not drink alcohol or use illicit drugs.  Allergies:  Allergies  Allergen Reactions  . Iodine Hives and Swelling  . Morphine And Related     Tremors     Medications:  I have reviewed the patient's current medications. Prior to Admission:  Prescriptions prior to admission  Medication Sig Dispense Refill Last Dose  . gabapentin (NEURONTIN) 300 MG capsule Take 300-600 mg by mouth 2 (two) times daily. 300 mg every morning  and 600 mg every night  5 Past Week at Unknown time  . lamoTRIgine (LAMICTAL) 200 MG tablet Take 200 mg by mouth at bedtime.  3 Past Week at Unknown time  . losartan (COZAAR) 25 MG tablet Take 25 mg by mouth daily.   Past Week at Unknown time  . lovastatin (MEVACOR) 10 MG tablet Take 10 mg by mouth daily.   Past Week at Unknown time  . metFORMIN (GLUCOPHAGE) 500 MG tablet Take 500 mg by mouth 2 (two) times daily.   Past Week at Unknown time  . metoprolol tartrate (LOPRESSOR) 25 MG tablet Take 25 mg by mouth daily.   Past Week at Unknown time  . OXYCONTIN 30 MG T12A Take 60 mg by mouth every 12 (twelve) hours as needed (pain).   0 Past Week at Unknown time  . PROAIR HFA 108 (90 BASE) MCG/ACT inhaler Inhale 2 puffs into the lungs 4 (four) times daily as needed for wheezing or shortness of breath.   0 Past Week at Unknown time  . tiZANidine (ZANAFLEX) 2 MG tablet Take 2-4 mg by mouth 3 (three) times daily as needed for muscle spasms.   1 Past Week at Unknown time  . venlafaxine XR (EFFEXOR-XR) 150 MG 24 hr capsule  Take 300 mg by mouth daily.  3 Past Week at Unknown time   Scheduled: . gabapentin  300 mg Oral Daily  . gabapentin  600 mg Oral QHS  . insulin aspart  0-15 Units Subcutaneous TID WC  . insulin aspart  0-5 Units Subcutaneous QHS  . insulin aspart  0-5 Units Subcutaneous QHS  . insulin glargine  10 Units Subcutaneous QHS  . lamoTRIgine  200 mg Oral QHS  . lidocaine (PF)      . losartan  25 mg Oral Daily  . metoprolol tartrate  25 mg Oral Daily  . OxyCODONE  60 mg Oral Q12H  . piperacillin-tazobactam (ZOSYN)  IV  3.375 g Intravenous Q8H  . [START ON 08/12/2014] vancomycin  750 mg Intravenous Q12H  . venlafaxine XR  300 mg Oral Daily   Continuous:  HTD:[id number] **OR** acetaminophen, albuterol, ondansetron **OR** ondansetron (ZOFRAN) IV, tiZANidine Anti-infectives    Start     Dose/Rate Route Frequency Ordered Stop   08/12/14 0100  vancomycin (VANCOCIN) IVPB 750 mg/150 ml premix      750 mg 150 mL/hr over 60 Minutes Intravenous Every 12 hours 08/11/14 1219     08/11/14 1800  cefTRIAXone (ROCEPHIN) 1 g in dextrose 5 % 50 mL IVPB  Status:  Discontinued     1 g 100 mL/hr over 30 Minutes Intravenous Every 24 hours 08/10/14 1718 08/11/14 1143   08/11/14 1700  azithromycin (ZITHROMAX) 500 mg in dextrose 5 % 250 mL IVPB  Status:  Discontinued     500 mg 250 mL/hr over 60 Minutes Intravenous Every 24 hours 08/10/14 1717 08/11/14 1143   08/11/14 1300  piperacillin-tazobactam (ZOSYN) IVPB 3.375 g     3.375 g 12.5 mL/hr over 240 Minutes Intravenous Every 8 hours 08/11/14 1219     08/11/14 1230  vancomycin (VANCOCIN) 2,000 mg in sodium chloride 0.9 % 500 mL IVPB     2,000 mg 250 mL/hr over 120 Minutes Intravenous  Once 08/11/14 1219 08/11/14 1539   08/10/14 1715  cefTRIAXone (ROCEPHIN) 1 g in dextrose 5 % 50 mL IVPB  Status:  Discontinued     1 g 100 mL/hr over 30 Minutes Intravenous Every 24 hours 08/10/14 1708 08/10/14 1718   08/10/14 1715  azithromycin (ZITHROMAX) 500 mg in dextrose 5 % 250 mL IVPB  Status:  Discontinued     500 mg 250 mL/hr over 60 Minutes Intravenous Every 24 hours 08/10/14 1708 08/10/14 1717   08/10/14 1615  cefTRIAXone (ROCEPHIN) 1 g in dextrose 5 % 50 mL IVPB     1 g 100 mL/hr over 30 Minutes Intravenous  Once 08/10/14 1613 08/10/14 1957   08/10/14 1615  azithromycin (ZITHROMAX) 500 mg in dextrose 5 % 250 mL IVPB     500 mg 250 mL/hr over 60 Minutes Intravenous  Once 08/10/14 1613 08/10/14 1957      Results for orders placed or performed during the hospital encounter of 08/10/14 (from the past 48 hour(s))  BNP (order ONLY if patient complains of dyspnea/SOB AND you have documented it for THIS visit)     Status: Abnormal   Collection Time: 08/10/14  1:20 PM  Result Value Ref Range   B Natriuretic Peptide 198.1 (H) 0.0 - 100.0 pg/mL  CBC     Status: Abnormal   Collection Time: 08/10/14  1:28 PM  Result Value Ref Range   WBC 27.4 (H) 4.0 - 10.5  K/uL   RBC 3.74 (L) 4.22 - 5.81 MIL/uL   Hemoglobin  10.7 (L) 13.0 - 17.0 g/dL   HCT 30.7 (L) 39.0 - 52.0 %   MCV 82.1 78.0 - 100.0 fL   MCH 28.6 26.0 - 34.0 pg   MCHC 34.9 30.0 - 36.0 g/dL   RDW 12.6 11.5 - 15.5 %   Platelets 300 150 - 400 K/uL  Basic metabolic panel     Status: Abnormal   Collection Time: 08/10/14  1:28 PM  Result Value Ref Range   Sodium 126 (L) 135 - 145 mmol/L   Potassium 4.6 3.5 - 5.1 mmol/L   Chloride 96 (L) 101 - 111 mmol/L   CO2 20 (L) 22 - 32 mmol/L   Glucose, Bld 408 (H) 65 - 99 mg/dL   BUN 22 (H) 6 - 20 mg/dL   Creatinine, Ser 1.11 0.61 - 1.24 mg/dL   Calcium 9.0 8.9 - 10.3 mg/dL   GFR calc non Af Amer >60 >60 mL/min   GFR calc Af Amer >60 >60 mL/min    Comment: (NOTE) The eGFR has been calculated using the CKD EPI equation. This calculation has not been validated in all clinical situations. eGFR's persistently <60 mL/min signify possible Chronic Kidney Disease.    Anion gap 10 5 - 15  I-stat troponin, ED  (not at [facility], [facility])     Status: None   Collection Time: 08/10/14  1:32 PM  Result Value Ref Range   Troponin i, poc 0.01 0.00 - 0.08 ng/mL   Comment 3            Comment: Due to the release kinetics of cTnI, a negative result within the first hours of the onset of symptoms does not rule out myocardial infarction with certainty. If myocardial infarction is still suspected, repeat the test at appropriate intervals.   Lactic acid, plasma     Status: None   Collection Time: 08/10/14  2:04 PM  Result Value Ref Range   Lactic Acid, Venous 1.2 0.5 - 2.0 mmol/L  Blood culture (routine x 2)     Status: None (Preliminary result)   Collection Time: 08/10/14  2:04 PM  Result Value Ref Range   Specimen Description BLOOD RIGHT HAND    Special Requests BOTTLES DRAWN AEROBIC AND ANAEROBIC  5 CC EA    Culture             BLOOD CULTURE RECEIVED NO GROWTH TO DATE CULTURE WILL BE HELD FOR 5 DAYS BEFORE ISSUING A FINAL NEGATIVE REPORT Performed at [facility]    Report Status PENDING   Blood culture (routine x 2)     Status: None (Preliminary result)   Collection Time: 08/10/14  2:05 PM  Result Value Ref Range   Specimen Description BLOOD LEFT ANTECUBITAL    Special Requests BOTTLES DRAWN AEROBIC AND ANAEROBIC 5 CC EA    Culture             BLOOD CULTURE RECEIVED NO GROWTH TO DATE CULTURE WILL BE HELD FOR 5 DAYS BEFORE ISSUING A FINAL NEGATIVE REPORT Performed at [facility]    Report Status PENDING   Lipase, blood     Status: Abnormal   Collection Time: 08/10/14  2:35 PM  Result Value Ref Range   Lipase 14 (L) 22 - 51 U/L  Hepatic function panel     Status: Abnormal   Collection Time: 08/10/14  2:35 PM  Result Value Ref Range   Total Protein 6.6 6.5 - 8.1 g/dL   Albumin 2.8 (L) 3.5 -  5.0 g/dL   AST 16 15 - 41 U/L   ALT 20 17 - 63 U/L   Alkaline Phosphatase 109 38 - 126 U/L   Total Bilirubin 0.5 0.3 - 1.2 mg/dL   Bilirubin, Direct 0.3 0.1 - 0.5 mg/dL   Indirect Bilirubin 0.2 (L) 0.3 - 0.9 mg/dL  Legionella antigen, urine     Status: None   Collection Time: 08/10/14  5:09 PM  Result Value Ref Range   Specimen Description URINE, CLEAN CATCH    Special Requests NONE    Legionella Antigen, Urine      Negative for Legionella pneumophila serogroup 1                                                              Legionella pneumophila serogroup 1 antigen can be detected in urine within 2 to 3 days of infection and may persist even after treatment. This  assay does not detect other Legionella species or serogroups. Performed at [facility]    Report Status 08/11/2014 FINAL   Strep pneumoniae urinary antigen     Status: None   Collection Time: 08/10/14  5:09 PM  Result Value Ref Range   Strep Pneumo Urinary Antigen NEGATIVE NEGATIVE    Comment: Performed at [facility]        Infection due to S. pneumoniae cannot be absolutely ruled out since the antigen present may be below the detection  limit of the test. Performed at [facility]   HIV antibody     Status: None   Collection Time: 08/10/14  5:28 PM  Result Value Ref Range   HIV Screen 4th Generation wRfx Non Reactive Non Reactive    Comment: (NOTE) Performed At: [facility] [place], [place] [id number] Lindon Romp MD VX:[id number]   CBG monitoring, ED     Status: Abnormal   Collection Time: 08/10/14  6:57 PM  Result Value Ref Range   Glucose-Capillary 350 (H) 65 - 99 mg/dL  CBG monitoring, ED     Status: Abnormal   Collection Time: 08/10/14  9:10 PM  Result Value Ref Range   Glucose-Capillary 384 (H) 65 - 99 mg/dL  Glucose, capillary     Status: Abnormal   Collection Time: 08/10/14 10:01 PM  Result Value Ref Range   Glucose-Capillary 375 (H) 65 - 99 mg/dL  MRSA PCR Screening     Status: None   Collection Time: 08/10/14 10:09 PM  Result Value Ref Range   MRSA by PCR NEGATIVE NEGATIVE    Comment:        The GeneXpert MRSA Assay (FDA approved for NASAL specimens only), is one component of a comprehensive MRSA colonization surveillance program. It is not intended to diagnose MRSA infection nor to guide or monitor treatment for MRSA infections.   Glucose, capillary     Status: Abnormal   Collection Time: 08/11/14  7:23 AM  Result Value Ref Range   Glucose-Capillary 230 (H) 65 - 99 mg/dL  Lactate dehydrogenase     Status: None   Collection Time: 08/11/14  8:42 AM  Result Value Ref Range   LDH 105 98 - 192 U/L  Procalcitonin - Baseline     Status: None   Collection Time: 08/11/14  8:42 AM  Result Value Ref Range   Procalcitonin 3.55 ng/mL    Comment:        Interpretation: PCT > 2 ng/mL: Systemic infection (sepsis) is likely, unless other causes are known. (NOTE)         ICU PCT Algorithm               Non ICU PCT Algorithm    ----------------------------     ------------------------------         PCT < 0.25 ng/mL                 PCT < 0.1 ng/mL     Stopping of  antibiotics            Stopping of antibiotics       strongly encouraged.               strongly encouraged.    ----------------------------     ------------------------------       PCT level decrease by               PCT < 0.25 ng/mL       >= 80% from peak PCT       OR PCT 0.25 - 0.5 ng/mL          Stopping of antibiotics                                             encouraged.     Stopping of antibiotics           encouraged.    ----------------------------     ------------------------------       PCT level decrease by              PCT >= 0.25 ng/mL       < 80% from peak PCT        AND PCT >= 0.5 ng/mL            Continuing antibiotics                                               encouraged.       Continuing antibiotics            encouraged.    ----------------------------     ------------------------------     PCT level increase compared          PCT > 0.5 ng/mL         with peak PCT AND          PCT >= 0.5 ng/mL             Escalation of antibiotics                                          strongly encouraged.      Escalation of antibiotics        strongly encouraged.   Lactate dehydrogenase (CSF, pleural or peritoneal fluid)     Status: Abnormal   Collection Time: 08/11/14 11:25 AM  Result Value Ref Range   LD, Fluid 946 (H) 3 - 23 U/L    Comment: (NOTE) Results should  be evaluated in conjunction with serum values    Fluid Type-FLDH Pleural, L   Protein, pleural or peritoneal fluid     Status: None   Collection Time: 08/11/14 11:25 AM  Result Value Ref Range   Total protein, fluid 4.4 g/dL    Comment: (NOTE) No normal range established for this test Results should be evaluated in conjunction with serum values    Fluid Type-FTP Pleural, L   Body fluid cell count with differential     Status: Abnormal   Collection Time: 08/11/14 11:25 AM  Result Value Ref Range   Fluid Type-FCT Pleural, L    Color, Fluid AMBER (A) YELLOW   Appearance, Fluid CLOUDY (A) CLEAR   WBC,  Fluid 13805 (H) 0 - 1000 cu mm   Neutrophil Count, Fluid 91 (H) 0 - 25 %   Lymphs, Fluid 7 %   Monocyte-Macrophage-Serous Fluid 2 (L) 50 - 90 %  Gram stain     Status: None   Collection Time: 08/11/14 11:25 AM  Result Value Ref Range   Specimen Description FLUID LEFT PLEURAL    Special Requests NONE    Gram Stain      ABUNDANT WBC PRESENT, PREDOMINANTLY PMN NO ORGANISMS SEEN    Report Status 08/11/2014 FINAL   Urinalysis, Routine w reflex microscopic (not at [facility])     Status: Abnormal   Collection Time: 08/11/14 12:16 PM  Result Value Ref Range   Color, Urine AMBER (A) YELLOW    Comment: BIOCHEMICALS MAY BE AFFECTED BY COLOR   APPearance CLEAR CLEAR   Specific Gravity, Urine 1.019 1.005 - 1.030   pH 5.5 5.0 - 8.0   Glucose, UA 250 (A) NEGATIVE mg/dL   Hgb urine dipstick TRACE (A) NEGATIVE   Bilirubin Urine SMALL (A) NEGATIVE   Ketones, ur NEGATIVE NEGATIVE mg/dL   Protein, ur 30 (A) NEGATIVE mg/dL   Urobilinogen, UA 1.0 0.0 - 1.0 mg/dL   Nitrite NEGATIVE NEGATIVE   Leukocytes, UA NEGATIVE NEGATIVE  Urine microscopic-add on     Status: Abnormal   Collection Time: 08/11/14 12:16 PM  Result Value Ref Range   Squamous Epithelial / LPF RARE RARE   WBC, UA 0-2 <3 WBC/hpf   RBC / HPF 3-6 <3 RBC/hpf   Bacteria, UA RARE RARE   Casts GRANULAR CAST (A) NEGATIVE  Glucose, capillary     Status: Abnormal   Collection Time: 08/11/14 12:32 PM  Result Value Ref Range   Glucose-Capillary 227 (H) 65 - 99 mg/dL  CBC     Status: Abnormal   Collection Time: 08/11/14  3:05 PM  Result Value Ref Range   WBC 36.4 (H) 4.0 - 10.5 K/uL    Comment: REPEATED TO VERIFY   RBC 3.79 (L) 4.22 - 5.81 MIL/uL   Hemoglobin 10.8 (L) 13.0 - 17.0 g/dL    Comment: REPEATED TO VERIFY   HCT 31.6 (L) 39.0 - 52.0 %   MCV 83.4 78.0 - 100.0 fL   MCH 28.5 26.0 - 34.0 pg   MCHC 34.2 30.0 - 36.0 g/dL   RDW 13.0 11.5 - 15.5 %   Platelets 328 150 - 400 K/uL  Basic metabolic panel     Status: Abnormal   Collection  Time: 08/11/14  3:05 PM  Result Value Ref Range   Sodium 129 (L) 135 - 145 mmol/L   Potassium 4.5 3.5 - 5.1 mmol/L   Chloride 98 (L) 101 - 111 mmol/L   CO2 23 22 - 32 mmol/L  Glucose, Bld 274 (H) 65 - 99 mg/dL   BUN 14 6 - 20 mg/dL   Creatinine, Ser 1.03 0.61 - 1.24 mg/dL   Calcium 9.1 8.9 - 10.3 mg/dL   GFR calc non Af Amer >60 >60 mL/min   GFR calc Af Amer >60 >60 mL/min    Comment: (NOTE) The eGFR has been calculated using the CKD EPI equation. This calculation has not been validated in all clinical situations. eGFR's persistently <60 mL/min signify possible Chronic Kidney Disease.    Anion gap 8 5 - 15  Glucose, capillary     Status: Abnormal   Collection Time: 08/11/14  4:43 PM  Result Value Ref Range   Glucose-Capillary 248 (H) 65 - 99 mg/dL    Ct Abdomen Pelvis Wo Contrast  08/10/2014   CLINICAL DATA:  69 year old male with chest pain cough and left upper quadrant pain. Initial encounter.  EXAM: CT ABDOMEN AND PELVIS WITHOUT CONTRAST  TECHNIQUE: Multidetector CT imaging of the abdomen and pelvis was performed following the standard protocol without IV contrast.  COMPARISON:  Chest radiographs today reported separately.  FINDINGS: Partially visible left lower lobe and lingula consolidation with air bronchograms and superimposed pleural effusion. The effusion extends laterally around the compressed lung parenchyma. No pericardial effusion. No definite cavitary changes.  Mild curvilinear opacity in the right medial lung base, azygoesophageal recess. No right pleural effusion.  No acute or suspicious osseous lesion identified.  No pelvic free fluid. Mildly distended bladder. Negative distal colon.  Redundant sigmoid colon mostly containing gas. Retained stool in the left colon and transverse colon. Retained stool at the hepatic flexure and ascending colon. Negative appendix and terminal ileum. No dilated small bowel. Oral contrast has not yet reached the distal small bowel. Negative  stomach and duodenum.  Surgically absent gallbladder. Non contrast liver, spleen, pancreas and adrenal glands are within normal limits. No abdominal free fluid. No hydronephrosis or hydroureter. Punctate left lower pole renal calculus. Mild nonspecific perinephric stranding. Right lower pole exophytic cyst with simple fluid densitometry. Aortoiliac calcified atherosclerosis noted. No lymphadenopathy in the abdomen or pelvis.  IMPRESSION: 1. Abnormal visualized left lower lobe and lingula with a combination of multilobar consolidation, and also compression of the left lung by a complex or loculated pleural effusion. This might all be related to pneumonia, but obstructing left hilar lung mass is also a consideration. Chest CT with IV contrast is recommended when possible to further characterize. 2. No acute or inflammatory findings in the abdomen or pelvis.   Electronically Signed   By: Genevie Ann M.D.   On: 08/10/2014 16:07   Dg Chest 1 View  08/11/2014   CLINICAL DATA:  Community acquired pneumonia. Left pleural effusion. Status post thoracentesis.  EXAM: CHEST  1 VIEW  COMPARISON:  None.  FINDINGS: A large left pleural effusion is seen with atelectasis or consolidation in the left mid and lower lung field. No pneumothorax visualized.  Right lung remains grossly clear. Question tiny right basilar pleural effusion versus pleural thickening. Heart size remains within normal limits.  IMPRESSION: Large left pleural effusion with left mid and lower lung atelectasis versus consolidation. No pneumothorax visualized.   Electronically Signed   By: Earle Gell M.D.   On: 08/11/2014 12:04   Dg Chest 2 View  08/10/2014   CLINICAL DATA:  Chest discomfort and cough for approximately 2 weeks. Elevated white blood cell count.  EXAM: CHEST  2 VIEW  COMPARISON:  None.  FINDINGS: The patient has a moderate  to moderately large left pleural effusion with associated left basilar airspace disease. The right lung appears clear. No  pneumothorax is identified. Heart size is normal.  IMPRESSION: Moderate to moderately large left pleural effusion with left basilar airspace disease worrisome for pneumonia given elevated white blood cell count. Follow-up to clearing is recommended.   Electronically Signed   By: Inge Rise M.D.   On: 08/10/2014 13:51   Ct Chest Wo Contrast  08/10/2014   CLINICAL DATA:  Cough for 2 weeks. Abnormal chest x-ray findings. Leukocytosis  EXAM: CT CHEST WITHOUT CONTRAST  TECHNIQUE: Multidetector CT imaging of the chest was performed following the standard protocol without IV contrast.  COMPARISON:  08/10/2014 abdominal CT  FINDINGS: THORACIC INLET/BODY WALL:  No acute abnormality.  MEDIASTINUM:  Normal heart size. No pericardial effusion. Atherosclerosis, including the coronary arteries. Mild enlargement of mediastinal lymph nodes, presumably reactive. No acute vascular findings.  LUNG WINDOWS:  There is extensive opacification and volume loss in the left lower lobe and lingula. Large left pleural effusion with scalloped margins. Although nodular in appearance, thickening along the upper mediastinum pleural surface appears low-density and is likely loculated fluid. Interlobular septal thickening in the left upper lobe which has a smooth appearance and is likely from compression. Patent central airways.  Subpleural density along the medial right lower lobe is along osteophytes and most consistent with scarring. Calcified granuloma in the right lower lobe.  UPPER ABDOMEN:  No acute findings.  OSSEOUS:  No acute fracture.  No suspicious lytic or blastic lesions.  IMPRESSION: Left chest disease is consistent with pneumonia and large complex effusion/empyema. An underlying lung lesion could easily be obscured on this noncontrast examination, and pleural fluid sampling and imaging follow-up is recommended.   Electronically Signed   By: Monte Fantasia M.D.   On: 08/10/2014 16:47   US Thoracentesis Asp Pleural Space  W/img Guide  08/11/2014   INDICATION: Symptomatic L sided pleural effusion  EXAM: US THORACENTESIS ASP PLEURAL SPACE W/IMG GUIDE  COMPARISON:  None.  MEDICATIONS: 10 cc 1% lidocaine  COMPLICATIONS: None immediate  TECHNIQUE: Informed written consent was obtained from the patient after a discussion of the risks, benefits and alternatives to treatment. A timeout was performed prior to the initiation of the procedure.  Initial ultrasound scanning demonstrates a left pleural effusion. The lower chest was prepped and draped in the usual sterile fashion. 1% lidocaine was used for local anesthesia.  Under direct ultrasound guidance, a 19 gauge, 7-cm, Yueh catheter was introduced. An ultrasound image was saved for documentation purposes. The thoracentesis was performed. The catheter was removed and a dressing was applied. The patient tolerated the procedure well without immediate post procedural complication. The patient was escorted to have an upright chest radiograph.  FINDINGS: A total of approximately 100 cc of brown, cloudy fluid was removed. Requested samples were sent to the laboratory.  IMPRESSION: Successful ultrasound-guided L sided thoracentesis yielding 100 cc of pleural fluid.  Read by:  Lavonia Drafts [facility]   Electronically Signed   By: Lucrezia Europe M.D.   On: 08/11/2014 11:53    Review of Systems  Constitutional: Positive for fever and malaise/fatigue. Negative for chills and weight loss.  HENT: Negative.   Eyes: Negative.   Respiratory: Positive for cough and shortness of breath. Negative for hemoptysis and sputum production.        Left chest pain  Cardiovascular: Negative.   Gastrointestinal: Negative.   Genitourinary: Negative.   Musculoskeletal: Negative.   Skin:  Negative.   Neurological: Negative.   Endo/Heme/Allergies: Negative.   Psychiatric/Behavioral:       Bipolar   Blood pressure 140/61, pulse 107, temperature 100.4 F (38 C), temperature source Oral, resp. rate 20, height _0   (1.803 m), weight 106.958 kg (235 lb 12.8 oz), SpO2 92 %. Physical Exam  Constitutional: He is oriented to person, place, and time. He appears well-developed and well-nourished. No distress.  HENT:  Head: Normocephalic and atraumatic.  Mouth/Throat: Oropharynx is clear and moist.  Eyes: EOM are normal. Pupils are equal, round, and reactive to light.  Neck: Neck supple. No JVD present. No thyromegaly present.  Decreased range of motion from recent spine surgery  Cardiovascular: Normal rate, regular rhythm, normal heart sounds and intact distal pulses.   No murmur heard. Respiratory: Effort normal. No respiratory distress. He has no wheezes. He has no rales. He exhibits no tenderness.  Decreased breath sounds on the left  GI: Soft. Bowel sounds are normal. He exhibits no distension and no mass. There is no tenderness.  Musculoskeletal: Normal range of motion. He exhibits no edema.  Lymphadenopathy:    He has no cervical adenopathy.  Neurological: He is alert and oriented to person, place, and time. He has normal strength. No cranial nerve deficit or sensory deficit.  Skin: Skin is warm and dry.  Psychiatric: He has a normal mood and affect.   CLINICAL DATA: Cough for 2 weeks. Abnormal chest x-ray findings. Leukocytosis  EXAM: CT CHEST WITHOUT CONTRAST  TECHNIQUE: Multidetector CT imaging of the chest was performed following the standard protocol without IV contrast.  COMPARISON: 08/10/2014 abdominal CT  FINDINGS: THORACIC INLET/BODY WALL:  No acute abnormality.  MEDIASTINUM:  Normal heart size. No pericardial effusion. Atherosclerosis, including the coronary arteries. Mild enlargement of mediastinal lymph nodes, presumably reactive. No acute vascular findings.  LUNG WINDOWS:  There is extensive opacification and volume loss in the left lower lobe and lingula. Large left pleural effusion with scalloped margins. Although nodular in appearance, thickening along  the upper mediastinum pleural surface appears low-density and is likely loculated fluid. Interlobular septal thickening in the left upper lobe which has a smooth appearance and is likely from compression. Patent central airways.  Subpleural density along the medial right lower lobe is along osteophytes and most consistent with scarring. Calcified granuloma in the right lower lobe.  UPPER ABDOMEN:  No acute findings.  OSSEOUS:  No acute fracture. No suspicious lytic or blastic lesions.  IMPRESSION: Left chest disease is consistent with pneumonia and large complex effusion/empyema. An underlying lung lesion could easily be obscured on this noncontrast examination, and pleural fluid sampling and imaging follow-up is recommended.   Electronically Signed  By: Monte Fantasia M.D.  On: 08/10/2014 16:47   Assessment/Plan:  He has a large loculated left pleural effusion and left lung consolidation with fever and leukocytosis consistent with pneumonia and empyema. This will require left thoracoscopy and probably thoracotomy for complete drainage and decortication of the left lung. I discussed the surgery with the patient and his daughter including alternatives, benefits and risks including but not limited to bleeding, infection, prolonged air leak, and respiratory failure. He understands and agrees to proceed. I will do tomorrow morning.  Gaye Pollack 08/11/2014, 5:28 PM

## 2014-08-11 NOTE — Procedures (Signed)
  US guided L thora 100 cc murky fluid Sent for labs per MD  Pt tolerated well  cxr pending

## 2014-08-11 NOTE — Progress Notes (Signed)
pts sats dropped to low 80s.  O2 turned up to 4L. Sating in low 90s Pt mentation returning to baseline. Will continue to monitor. MD made aware

## 2014-08-11 NOTE — Progress Notes (Signed)
[facility] PROGRESS NOTE  Dwayne Vargas ZOX:[id number] DOB: 10/25/1945 DOA: 08/10/2014 PCP: Irving Copas, MD  Brief narrative 69 year old male with history of diabetes mellitus, bipolar disorder, hypertension presented with shortness of breath with left-sided chest discomfort and fever for the past 1 week. Patient found to have left-sided pneumonia with large parapneumonic effusion/ complex empyema.   Assessment/Plan: Sepsis secondary to Community acquired pneumonia with parapneumonic effusion Started on empiric Rocephin and azithromycin. Patient still febrile and switched antibiotics to cover broadly for healthcare associated pneumonia. (Vancomycin and Zosyn) Patient underwent ultrasound-guided thoracentesis with only about 100 cc turbid fluid drained.. Labs suggestive exudate. Follow Gram stain, cultures and cytology. Appreciate the CCM evaluation and recommend VATS drainage.  consulted CTVS. Supportive care with Tylenol and pain medications.   Type 2 diabetes mellitus, uncontrolled Monitor on sliding scale insulin. Continue Neurontin for peripheral neuropathy. Check A1c  Bipolar disorder Continue Lamictal  Essential hypertension Continue metoprolol and losartan.  Hyponatremia Possibly SIADH secondary to pneumonia. Repeat BMET  Diet: Diabetic DVT prophylaxis: SCDs  CODE STATUS: Full code   Family Communication: None at bedside  Disposition Plan: Continue inpatient monitoring. Home once stable   Consultants:  Pulmonary  Cardiothoracic surgery  Procedures:  CT chest with contrast  Ultrasound-guided thoracentesis  Antibiotics:  IV Rocephin and azithromycin 6/14-6/15  IV vancomycin and Zosyn: 6/15--  HPI/Subjective: Patient seen and examined this morning after returning from thoracentesis. Reports his breathing to be better and denies chest pain today. Still having temperature spikes. Discussed possibility of VATS  Objective: Filed Vitals:    08/11/14 1144  BP: 103/55  Pulse:   Temp:   Resp:     Intake/Output Summary (Last 24 hours) at 08/11/14 1336 Last data filed at 08/11/14 0824  Gross per 24 hour  Intake    240 ml  Output   1000 ml  Net   -760 ml   Filed Weights   08/10/14 2158  Weight: 106.958 kg (235 lb 12.8 oz)    Exam:   General:  Elderly male in no acute distress  HEENT: No pallor, moist oral mucosa  Chest: Diminished left sided breath sounds, no crackles or rhonchi  CVS: Normal S1 and S2, no murmurs rub or gallop is on GI: Soft, nondistended, nontender, bowel sounds present  Musculoskeletal: Warm, no edema  CNS: Alert and oriented     Data Reviewed: Basic Metabolic Panel:  Recent Labs Lab 08/10/14 1328  NA 126*  K 4.6  CL 96*  CO2 20*  GLUCOSE 408*  BUN 22*  CREATININE 1.11  CALCIUM 9.0   Liver Function Tests:  Recent Labs Lab 08/10/14 1435  AST 16  ALT 20  ALKPHOS 109  BILITOT 0.5  PROT 6.6  ALBUMIN 2.8*    Recent Labs Lab 08/10/14 1435  LIPASE 14*   No results for input(s): AMMONIA in the last 168 hours. CBC:  Recent Labs Lab 08/10/14 1328  WBC 27.4*  HGB 10.7*  HCT 30.7*  MCV 82.1  PLT 300   Cardiac Enzymes: No results for input(s): CKTOTAL, CKMB, CKMBINDEX, TROPONINI in the last 168 hours. BNP (last 3 results)  Recent Labs  08/10/14 1320  BNP 198.1*    ProBNP (last 3 results) No results for input(s): PROBNP in the last 8760 hours.  CBG:  Recent Labs Lab 08/10/14 1857 08/10/14 2110 08/10/14 2201 08/11/14 0723 08/11/14 1232  GLUCAP 350* 384* 375* 230* 227*    Recent Results (from the past 240 hour(s))  Blood culture (routine x 2)  Status: None (Preliminary result)   Collection Time: 08/10/14  2:04 PM  Result Value Ref Range Status   Specimen Description BLOOD RIGHT HAND  Final   Special Requests BOTTLES DRAWN AEROBIC AND ANAEROBIC  5 CC EA  Final   Culture   Final           BLOOD CULTURE RECEIVED NO GROWTH TO DATE CULTURE WILL  BE HELD FOR 5 DAYS BEFORE ISSUING A FINAL NEGATIVE REPORT Performed at [facility]    Report Status PENDING  Incomplete  Blood culture (routine x 2)     Status: None (Preliminary result)   Collection Time: 08/10/14  2:05 PM  Result Value Ref Range Status   Specimen Description BLOOD LEFT ANTECUBITAL  Final   Special Requests BOTTLES DRAWN AEROBIC AND ANAEROBIC 5 CC EA  Final   Culture   Final           BLOOD CULTURE RECEIVED NO GROWTH TO DATE CULTURE WILL BE HELD FOR 5 DAYS BEFORE ISSUING A FINAL NEGATIVE REPORT Performed at [facility]    Report Status PENDING  Incomplete  MRSA PCR Screening     Status: None   Collection Time: 08/10/14 10:09 PM  Result Value Ref Range Status   MRSA by PCR NEGATIVE NEGATIVE Final    Comment:        The GeneXpert MRSA Assay (FDA approved for NASAL specimens only), is one component of a comprehensive MRSA colonization surveillance program. It is not intended to diagnose MRSA infection nor to guide or monitor treatment for MRSA infections.      Studies: Ct Abdomen Pelvis Wo Contrast  08/10/2014   CLINICAL DATA:  69 year old male with chest pain cough and left upper quadrant pain. Initial encounter.  EXAM: CT ABDOMEN AND PELVIS WITHOUT CONTRAST  TECHNIQUE: Multidetector CT imaging of the abdomen and pelvis was performed following the standard protocol without IV contrast.  COMPARISON:  Chest radiographs today reported separately.  FINDINGS: Partially visible left lower lobe and lingula consolidation with air bronchograms and superimposed pleural effusion. The effusion extends laterally around the compressed lung parenchyma. No pericardial effusion. No definite cavitary changes.  Mild curvilinear opacity in the right medial lung base, azygoesophageal recess. No right pleural effusion.  No acute or suspicious osseous lesion identified.  No pelvic free fluid. Mildly distended bladder. Negative distal colon.  Redundant sigmoid colon mostly  containing gas. Retained stool in the left colon and transverse colon. Retained stool at the hepatic flexure and ascending colon. Negative appendix and terminal ileum. No dilated small bowel. Oral contrast has not yet reached the distal small bowel. Negative stomach and duodenum.  Surgically absent gallbladder. Non contrast liver, spleen, pancreas and adrenal glands are within normal limits. No abdominal free fluid. No hydronephrosis or hydroureter. Punctate left lower pole renal calculus. Mild nonspecific perinephric stranding. Right lower pole exophytic cyst with simple fluid densitometry. Aortoiliac calcified atherosclerosis noted. No lymphadenopathy in the abdomen or pelvis.  IMPRESSION: 1. Abnormal visualized left lower lobe and lingula with a combination of multilobar consolidation, and also compression of the left lung by a complex or loculated pleural effusion. This might all be related to pneumonia, but obstructing left hilar lung mass is also a consideration. Chest CT with IV contrast is recommended when possible to further characterize. 2. No acute or inflammatory findings in the abdomen or pelvis.   Electronically Signed   By: Odessa Fleming M.D.   On: 08/10/2014 16:07   Dg Chest 1 View  08/11/2014   CLINICAL DATA:  Community acquired pneumonia. Left pleural effusion. Status post thoracentesis.  EXAM: CHEST  1 VIEW  COMPARISON:  None.  FINDINGS: A large left pleural effusion is seen with atelectasis or consolidation in the left mid and lower lung field. No pneumothorax visualized.  Right lung remains grossly clear. Question tiny right basilar pleural effusion versus pleural thickening. Heart size remains within normal limits.  IMPRESSION: Large left pleural effusion with left mid and lower lung atelectasis versus consolidation. No pneumothorax visualized.   Electronically Signed   By: Myles Rosenthal M.D.   On: 08/11/2014 12:04   Dg Chest 2 View  08/10/2014   CLINICAL DATA:  Chest discomfort and cough for  approximately 2 weeks. Elevated white blood cell count.  EXAM: CHEST  2 VIEW  COMPARISON:  None.  FINDINGS: The patient has a moderate to moderately large left pleural effusion with associated left basilar airspace disease. The right lung appears clear. No pneumothorax is identified. Heart size is normal.  IMPRESSION: Moderate to moderately large left pleural effusion with left basilar airspace disease worrisome for pneumonia given elevated white blood cell count. Follow-up to clearing is recommended.   Electronically Signed   By: Drusilla Kanner M.D.   On: 08/10/2014 13:51   Ct Chest Wo Contrast  08/10/2014   CLINICAL DATA:  Cough for 2 weeks. Abnormal chest x-ray findings. Leukocytosis  EXAM: CT CHEST WITHOUT CONTRAST  TECHNIQUE: Multidetector CT imaging of the chest was performed following the standard protocol without IV contrast.  COMPARISON:  08/10/2014 abdominal CT  FINDINGS: THORACIC INLET/BODY WALL:  No acute abnormality.  MEDIASTINUM:  Normal heart size. No pericardial effusion. Atherosclerosis, including the coronary arteries. Mild enlargement of mediastinal lymph nodes, presumably reactive. No acute vascular findings.  LUNG WINDOWS:  There is extensive opacification and volume loss in the left lower lobe and lingula. Large left pleural effusion with scalloped margins. Although nodular in appearance, thickening along the upper mediastinum pleural surface appears low-density and is likely loculated fluid. Interlobular septal thickening in the left upper lobe which has a smooth appearance and is likely from compression. Patent central airways.  Subpleural density along the medial right lower lobe is along osteophytes and most consistent with scarring. Calcified granuloma in the right lower lobe.  UPPER ABDOMEN:  No acute findings.  OSSEOUS:  No acute fracture.  No suspicious lytic or blastic lesions.  IMPRESSION: Left chest disease is consistent with pneumonia and large complex effusion/empyema. An  underlying lung lesion could easily be obscured on this noncontrast examination, and pleural fluid sampling and imaging follow-up is recommended.   Electronically Signed   By: Marnee Spring M.D.   On: 08/10/2014 16:47   US Thoracentesis Asp Pleural Space W/img Guide  08/11/2014   INDICATION: Symptomatic L sided pleural effusion  EXAM: US THORACENTESIS ASP PLEURAL SPACE W/IMG GUIDE  COMPARISON:  None.  MEDICATIONS: 10 cc 1% lidocaine  COMPLICATIONS: None immediate  TECHNIQUE: Informed written consent was obtained from the patient after a discussion of the risks, benefits and alternatives to treatment. A timeout was performed prior to the initiation of the procedure.  Initial ultrasound scanning demonstrates a left pleural effusion. The lower chest was prepped and draped in the usual sterile fashion. 1% lidocaine was used for local anesthesia.  Under direct ultrasound guidance, a 19 gauge, 7-cm, Yueh catheter was introduced. An ultrasound image was saved for documentation purposes. The thoracentesis was performed. The catheter was removed and a dressing was applied. The patient  tolerated the procedure well without immediate post procedural complication. The patient was escorted to have an upright chest radiograph.  FINDINGS: A total of approximately 100 cc of brown, cloudy fluid was removed. Requested samples were sent to the laboratory.  IMPRESSION: Successful ultrasound-guided L sided thoracentesis yielding 100 cc of pleural fluid.  Read by:  Robet Leu [facility]   Electronically Signed   By: Corlis Leak M.D.   On: 08/11/2014 11:53    Scheduled Meds: . gabapentin  300 mg Oral Daily  . gabapentin  600 mg Oral QHS  . insulin aspart  0-15 Units Subcutaneous TID WC  . insulin aspart  0-5 Units Subcutaneous QHS  . lamoTRIgine  200 mg Oral QHS  . lidocaine (PF)      . losartan  25 mg Oral Daily  . metoprolol tartrate  25 mg Oral Daily  . OxyCODONE  60 mg Oral Q12H  . piperacillin-tazobactam (ZOSYN)  IV   3.375 g Intravenous Q8H  . vancomycin  2,000 mg Intravenous Once  . [START ON 08/12/2014] vancomycin  750 mg Intravenous Q12H  . venlafaxine XR  300 mg Oral Daily   Continuous Infusions:     Time spent: 25 minutes    Eddie North  [facility] Pager [phone] If 7PM-7AM, please contact night-coverage at [email or web address], password [email or web address] 08/11/2014, 1:36 PM  LOS: 1 day

## 2014-08-11 NOTE — Progress Notes (Signed)
Pt temp 100.4.tylenol given. MD notified

## 2014-08-11 NOTE — Progress Notes (Signed)
Pt disoriented to place and situation. MD notified. VSS.  Will continue to monitor.
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
## 2014-08-11 NOTE — Progress Notes (Signed)
Urine dark amber and malodorous. MD notified will gather UA/culture

## 2014-08-12 ENCOUNTER — Inpatient Hospital Stay (HOSPITAL_COMMUNITY): Payer: Medicare PPO | Admitting: Anesthesiology

## 2014-08-12 ENCOUNTER — Inpatient Hospital Stay (HOSPITAL_COMMUNITY): Payer: Medicare PPO

## 2014-08-12 ENCOUNTER — Encounter (HOSPITAL_COMMUNITY)
Admission: EM | Disposition: A | Discharge status: Home / Self Care | Payer: Self-pay | Source: Home / Self Care | Attending: Surgery

## 2014-08-12 DIAGNOSIS — J869 Pyothorax without fistula: Secondary | ICD-10-CM | POA: Diagnosis present

## 2014-08-12 DIAGNOSIS — A419 Sepsis, unspecified organism: Principal | ICD-10-CM

## 2014-08-12 DIAGNOSIS — G9340 Encephalopathy, unspecified: Secondary | ICD-10-CM

## 2014-08-12 DIAGNOSIS — J189 Pneumonia, unspecified organism: Secondary | ICD-10-CM

## 2014-08-12 HISTORY — PX: VIDEO ASSISTED THORACOSCOPY (VATS)/THOROCOTOMY: SHX6173

## 2014-08-12 LAB — POCT I-STAT 7, (LYTES, BLD GAS, ICA,H+H)
Acid-base deficit: 6 mmol/L — ABNORMAL HIGH (ref 0.0–2.0)
Bicarbonate: 22.8 mEq/L (ref 20.0–24.0)
CALCIUM ION: 1.29 mmol/L (ref 1.13–1.30)
HEMATOCRIT: 28 % — AB (ref 39.0–52.0)
HEMOGLOBIN: 9.5 g/dL — AB (ref 13.0–17.0)
O2 Saturation: 85 %
PCO2 ART: 59.7 mmHg — AB (ref 35.0–45.0)
Potassium: 4.8 mmol/L (ref 3.5–5.1)
SODIUM: 131 mmol/L — AB (ref 135–145)
TCO2: 25 mmol/L (ref 0–100)
pH, Arterial: 7.189 — CL (ref 7.350–7.450)
pO2, Arterial: 63 mmHg — ABNORMAL LOW (ref 80.0–100.0)

## 2014-08-12 LAB — URINE CULTURE
Culture: NO GROWTH
SPECIAL REQUESTS: NORMAL

## 2014-08-12 LAB — GLUCOSE, CAPILLARY
GLUCOSE-CAPILLARY: 196 mg/dL — AB (ref 65–99)
GLUCOSE-CAPILLARY: 203 mg/dL — AB (ref 65–99)
GLUCOSE-CAPILLARY: 356 mg/dL — AB (ref 65–99)
Glucose-Capillary: 234 mg/dL — ABNORMAL HIGH (ref 65–99)

## 2014-08-12 LAB — HEMOGLOBIN A1C
Hgb A1c MFr Bld: 9.5 % — ABNORMAL HIGH (ref 4.8–5.6)
Mean Plasma Glucose: 226 mg/dL

## 2014-08-12 LAB — POCT I-STAT 3, ART BLOOD GAS (G3+)
Acid-base deficit: 4 mmol/L — ABNORMAL HIGH (ref 0.0–2.0)
BICARBONATE: 21.8 meq/L (ref 20.0–24.0)
O2 SAT: 91 %
PH ART: 7.341 — AB (ref 7.350–7.450)
TCO2: 23 mmol/L (ref 0–100)
pCO2 arterial: 40.4 mmHg (ref 35.0–45.0)
pO2, Arterial: 64 mmHg — ABNORMAL LOW (ref 80.0–100.0)

## 2014-08-12 LAB — PATHOLOGIST SMEAR REVIEW

## 2014-08-12 LAB — GRAM STAIN

## 2014-08-12 LAB — AMYLASE, PLEURAL FLUID: AMYLASE, PLEURAL FLUID: 11 U/L

## 2014-08-12 LAB — PREPARE RBC (CROSSMATCH)

## 2014-08-12 LAB — ABO/RH: ABO/RH(D): A NEG

## 2014-08-12 SURGERY — VIDEO ASSISTED THORACOSCOPY (VATS)/THOROCOTOMY
Anesthesia: General | Site: Chest | Laterality: Left

## 2014-08-12 MED ORDER — LIDOCAINE HCL (CARDIAC) 20 MG/ML IV SOLN
INTRAVENOUS | Status: DC | PRN
  Administered 2014-08-12: 100 mg via INTRAVENOUS

## 2014-08-12 MED ORDER — SODIUM CHLORIDE 0.9 % IV SOLN
10.0000 mL/h | Freq: Once | INTRAVENOUS | Status: DC
Start: 2014-08-12 — End: 2014-08-12

## 2014-08-12 MED ORDER — POTASSIUM CHLORIDE 10 MEQ/50ML IV SOLN: 10.0000 meq | Freq: Every day | INTRAVENOUS | Status: DC | PRN

## 2014-08-12 MED ORDER — INSULIN ASPART 100 UNIT/ML ~~LOC~~ SOLN
0.0000 [IU] | SUBCUTANEOUS | Status: DC
  Administered 2014-08-12: 8 [IU] via SUBCUTANEOUS
  Administered 2014-08-12: 20 [IU] via SUBCUTANEOUS
  Administered 2014-08-13: 4 [IU] via SUBCUTANEOUS
  Administered 2014-08-13: 2 [IU] via SUBCUTANEOUS
  Administered 2014-08-13 (×2): 4 [IU] via SUBCUTANEOUS

## 2014-08-12 MED ORDER — OXYCODONE HCL 5 MG PO TABS: 5.0000 mg | ORAL_TABLET | Freq: Once | ORAL | Status: DC | PRN

## 2014-08-12 MED ORDER — ACETAMINOPHEN 160 MG/5ML PO SOLN
1000.0000 mg | Freq: Four times a day (QID) | ORAL | Status: AC
  Filled 2014-08-12: qty 40

## 2014-08-12 MED ORDER — OXYCODONE HCL 5 MG PO TABS
5.0000 mg | ORAL_TABLET | ORAL | Status: DC | PRN
  Administered 2014-08-12: 10 mg via ORAL
  Administered 2014-08-12: 5 mg via ORAL
  Administered 2014-08-13 – 2014-08-15 (×8): 10 mg via ORAL
  Filled 2014-08-12: qty 1
  Filled 2014-08-12 (×9): qty 2

## 2014-08-12 MED ORDER — SENNOSIDES-DOCUSATE SODIUM 8.6-50 MG PO TABS
1.0000 | ORAL_TABLET | Freq: Every day | ORAL | Status: DC
  Administered 2014-08-13: 1 via ORAL
  Filled 2014-08-12 (×4): qty 1

## 2014-08-12 MED ORDER — INSULIN DETEMIR 100 UNIT/ML ~~LOC~~ SOLN
20.0000 [IU] | Freq: Two times a day (BID) | SUBCUTANEOUS | Status: DC
  Administered 2014-08-12: 20 [IU] via SUBCUTANEOUS
  Filled 2014-08-12 (×2): qty 0.2

## 2014-08-12 MED ORDER — ROCURONIUM BROMIDE 100 MG/10ML IV SOLN
INTRAVENOUS | Status: DC | PRN
  Administered 2014-08-12: 50 mg via INTRAVENOUS

## 2014-08-12 MED ORDER — LEVALBUTEROL HCL 0.63 MG/3ML IN NEBU
0.6300 mg | INHALATION_SOLUTION | Freq: Four times a day (QID) | RESPIRATORY_TRACT | Status: DC
  Administered 2014-08-12 – 2014-08-15 (×7): 0.63 mg via RESPIRATORY_TRACT
  Filled 2014-08-12 (×18): qty 3

## 2014-08-12 MED ORDER — ACETAMINOPHEN 500 MG PO TABS
1000.0000 mg | ORAL_TABLET | Freq: Four times a day (QID) | ORAL | Status: AC
  Administered 2014-08-12 – 2014-08-17 (×18): 1000 mg via ORAL
  Filled 2014-08-12 (×18): qty 2

## 2014-08-12 MED ORDER — METOCLOPRAMIDE HCL 5 MG/ML IJ SOLN
10.0000 mg | Freq: Four times a day (QID) | INTRAMUSCULAR | Status: DC
  Administered 2014-08-12 – 2014-08-15 (×11): 10 mg via INTRAVENOUS
  Filled 2014-08-12 (×15): qty 2

## 2014-08-12 MED ORDER — HYDROMORPHONE HCL 1 MG/ML IJ SOLN
0.2500 mg | INTRAMUSCULAR | Status: DC | PRN
  Administered 2014-08-12: 0.25 mg via INTRAVENOUS
  Administered 2014-08-12 (×3): 0.5 mg via INTRAVENOUS
  Administered 2014-08-12: 0.25 mg via INTRAVENOUS

## 2014-08-12 MED ORDER — BISACODYL 5 MG PO TBEC
10.0000 mg | DELAYED_RELEASE_TABLET | Freq: Every day | ORAL | Status: DC
  Filled 2014-08-12: qty 2

## 2014-08-12 MED ORDER — HYDROMORPHONE HCL 1 MG/ML IJ SOLN
INTRAMUSCULAR | Status: AC
  Filled 2014-08-12: qty 1

## 2014-08-12 MED ORDER — NEOSTIGMINE METHYLSULFATE 10 MG/10ML IV SOLN
INTRAVENOUS | Status: AC
  Filled 2014-08-12: qty 2

## 2014-08-12 MED ORDER — ONDANSETRON HCL 4 MG/2ML IJ SOLN
4.0000 mg | Freq: Four times a day (QID) | INTRAMUSCULAR | Status: DC | PRN
  Administered 2014-08-14 (×2): 4 mg via INTRAVENOUS
  Filled 2014-08-12 (×2): qty 2

## 2014-08-12 MED ORDER — FENTANYL CITRATE (PF) 250 MCG/5ML IJ SOLN
INTRAMUSCULAR | Status: DC | PRN
  Administered 2014-08-12: 150 ug via INTRAVENOUS
  Administered 2014-08-12: 100 ug via INTRAVENOUS

## 2014-08-12 MED ORDER — ONDANSETRON HCL 4 MG/2ML IJ SOLN
INTRAMUSCULAR | Status: DC | PRN
  Administered 2014-08-12: 4 mg via INTRAVENOUS

## 2014-08-12 MED ORDER — FENTANYL CITRATE (PF) 100 MCG/2ML IJ SOLN
25.0000 ug | INTRAMUSCULAR | Status: DC | PRN
  Administered 2014-08-12 – 2014-08-14 (×8): 50 ug via INTRAVENOUS
  Filled 2014-08-12 (×8): qty 2

## 2014-08-12 MED ORDER — PHENYLEPHRINE HCL 10 MG/ML IJ SOLN
INTRAMUSCULAR | Status: DC | PRN
  Administered 2014-08-12: 120 ug via INTRAVENOUS

## 2014-08-12 MED ORDER — OXYCODONE HCL 5 MG/5ML PO SOLN: 5.0000 mg | Freq: Once | ORAL | Status: DC | PRN

## 2014-08-12 MED ORDER — LACTATED RINGERS IV SOLN
INTRAVENOUS | Status: DC | PRN
  Administered 2014-08-12 (×2): via INTRAVENOUS

## 2014-08-12 MED ORDER — DEXTROSE 5 % IV SOLN
10.0000 mg | INTRAVENOUS | Status: DC | PRN
  Administered 2014-08-12: 25 ug/min via INTRAVENOUS

## 2014-08-12 MED ORDER — 0.9 % SODIUM CHLORIDE (POUR BTL) OPTIME
TOPICAL | Status: DC | PRN
  Administered 2014-08-12: 2000 mL

## 2014-08-12 MED ORDER — FENTANYL CITRATE (PF) 250 MCG/5ML IJ SOLN
INTRAMUSCULAR | Status: AC
  Filled 2014-08-12: qty 5

## 2014-08-12 MED ORDER — GLYCOPYRROLATE 0.2 MG/ML IJ SOLN
INTRAMUSCULAR | Status: DC | PRN
Start: 2014-08-12 — End: 2014-08-12
  Administered 2014-08-12: .4 mg via INTRAVENOUS

## 2014-08-12 MED ORDER — NEOSTIGMINE METHYLSULFATE 10 MG/10ML IV SOLN
INTRAVENOUS | Status: DC | PRN
  Administered 2014-08-12: 3 mg via INTRAVENOUS

## 2014-08-12 MED ORDER — ALBUMIN HUMAN 5 % IV SOLN
INTRAVENOUS | Status: DC | PRN
  Administered 2014-08-12 (×2): via INTRAVENOUS

## 2014-08-12 MED ORDER — ONDANSETRON HCL 4 MG/2ML IJ SOLN: 4.0000 mg | Freq: Four times a day (QID) | INTRAMUSCULAR | Status: DC | PRN

## 2014-08-12 MED ORDER — PROPOFOL 10 MG/ML IV BOLUS
INTRAVENOUS | Status: DC | PRN
  Administered 2014-08-12: 110 mg via INTRAVENOUS

## 2014-08-12 SURGICAL SUPPLY — 72 items
APPLICATOR TIP COSEAL (VASCULAR PRODUCTS) IMPLANT
APPLICATOR TIP EXT COSEAL (VASCULAR PRODUCTS) IMPLANT
CANISTER SUCTION 2500CC (MISCELLANEOUS) ×3 IMPLANT
CATH KIT ON Q 5IN SLV (PAIN MANAGEMENT) IMPLANT
CATH THORACIC 28FR (CATHETERS) IMPLANT
CATH THORACIC 36FR (CATHETERS) IMPLANT
CATH THORACIC 36FR RT ANG (CATHETERS) IMPLANT
CLEANER TIP ELECTROSURG 2X2 (MISCELLANEOUS) IMPLANT
CLIP TI MEDIUM 6 (CLIP) IMPLANT
CONT SPEC 4OZ CLIKSEAL STRL BL (MISCELLANEOUS) ×6 IMPLANT
DERMABOND ADVANCED (GAUZE/BANDAGES/DRESSINGS)
DERMABOND ADVANCED .7 DNX12 (GAUZE/BANDAGES/DRESSINGS) IMPLANT
DRAIN CHANNEL 32F RND 10.7 FF (WOUND CARE) ×6 IMPLANT
DRAPE LAPAROSCOPIC ABDOMINAL (DRAPES) ×3 IMPLANT
DRAPE SLUSH/WARMER DISC (DRAPES) ×3 IMPLANT
DRAPE WARM FLUID 44X44 (DRAPE) IMPLANT
ELECT BLADE 6.5 EXT (BLADE) ×3 IMPLANT
ELECT CAUTERY BLADE 6.4 (BLADE) ×3 IMPLANT
ELECT PENCIL ROCKER SW 15FT (MISCELLANEOUS) ×3 IMPLANT
ELECT REM PT RETURN 9FT ADLT (ELECTROSURGICAL) ×3
ELECTRODE REM PT RTRN 9FT ADLT (ELECTROSURGICAL) ×1 IMPLANT
GAUZE SPONGE 4X4 12PLY STRL (GAUZE/BANDAGES/DRESSINGS) ×3 IMPLANT
GLOVE BIO SURGEON STRL SZ 6.5 (GLOVE) ×2 IMPLANT
GLOVE BIO SURGEONS STRL SZ 6.5 (GLOVE) ×1
GLOVE BIOGEL PI IND STRL 6.5 (GLOVE) ×2 IMPLANT
GLOVE BIOGEL PI IND STRL 7.0 (GLOVE) ×3 IMPLANT
GLOVE BIOGEL PI INDICATOR 6.5 (GLOVE) ×4
GLOVE BIOGEL PI INDICATOR 7.0 (GLOVE) ×6
GLOVE EUDERMIC 7 POWDERFREE (GLOVE) ×6 IMPLANT
GOWN STRL REUS W/ TWL LRG LVL3 (GOWN DISPOSABLE) ×4 IMPLANT
GOWN STRL REUS W/ TWL XL LVL3 (GOWN DISPOSABLE) ×1 IMPLANT
GOWN STRL REUS W/TWL LRG LVL3 (GOWN DISPOSABLE) ×8
GOWN STRL REUS W/TWL XL LVL3 (GOWN DISPOSABLE) ×2
KIT BASIN OR (CUSTOM PROCEDURE TRAY) ×3 IMPLANT
KIT ROOM TURNOVER OR (KITS) ×3 IMPLANT
KIT SUCTION CATH 14FR (SUCTIONS) ×3 IMPLANT
NS IRRIG 1000ML POUR BTL (IV SOLUTION) ×6 IMPLANT
PACK CHEST (CUSTOM PROCEDURE TRAY) ×3 IMPLANT
PAD ARMBOARD 7.5X6 YLW CONV (MISCELLANEOUS) ×6 IMPLANT
PASSER SUT SWANSON 36MM LOOP (INSTRUMENTS) ×3 IMPLANT
SEALANT SURG COSEAL 4ML (VASCULAR PRODUCTS) IMPLANT
SEALANT SURG COSEAL 8ML (VASCULAR PRODUCTS) IMPLANT
SOLUTION ANTI FOG 6CC (MISCELLANEOUS) ×3 IMPLANT
SPONGE GAUZE 4X4 12PLY STER LF (GAUZE/BANDAGES/DRESSINGS) ×3 IMPLANT
SUT PROLENE 3 0 SH DA (SUTURE) IMPLANT
SUT PROLENE 4 0 RB 1 (SUTURE)
SUT PROLENE 4-0 RB1 .5 CRCL 36 (SUTURE) IMPLANT
SUT SILK  1 MH (SUTURE) ×6
SUT SILK 1 MH (SUTURE) ×3 IMPLANT
SUT SILK 1 TIES 10X30 (SUTURE) IMPLANT
SUT SILK 2 0SH CR/8 30 (SUTURE) IMPLANT
SUT SILK 3 0SH CR/8 30 (SUTURE) IMPLANT
SUT VIC AB 1 CTX 18 (SUTURE) IMPLANT
SUT VIC AB 1 CTX 36 (SUTURE) ×2
SUT VIC AB 1 CTX36XBRD ANBCTR (SUTURE) ×1 IMPLANT
SUT VIC AB 2-0 CTX 36 (SUTURE) ×3 IMPLANT
SUT VIC AB 2-0 UR6 27 (SUTURE) IMPLANT
SUT VIC AB 3-0 MH 27 (SUTURE) IMPLANT
SUT VIC AB 3-0 X1 27 (SUTURE) ×3 IMPLANT
SUT VICRYL 2 TP 1 (SUTURE) ×3 IMPLANT
SWAB COLLECTION DEVICE MRSA (MISCELLANEOUS) IMPLANT
SYSTEM SAHARA CHEST DRAIN ATS (WOUND CARE) ×3 IMPLANT
TAPE CLOTH 4X10 WHT NS (GAUZE/BANDAGES/DRESSINGS) ×3 IMPLANT
TAPE CLOTH SURG 4X10 WHT LF (GAUZE/BANDAGES/DRESSINGS) ×3 IMPLANT
TIP APPLICATOR SPRAY EXTEND 16 (VASCULAR PRODUCTS) IMPLANT
TOWEL OR 17X24 6PK STRL BLUE (TOWEL DISPOSABLE) ×3 IMPLANT
TOWEL OR 17X26 10 PK STRL BLUE (TOWEL DISPOSABLE) ×6 IMPLANT
TRAP SPECIMEN MUCOUS 40CC (MISCELLANEOUS) ×3 IMPLANT
TRAY FOLEY CATH 14FRSI W/METER (CATHETERS) IMPLANT
TRAY FOLEY CATH 16FRSI W/METER (SET/KITS/TRAYS/PACK) ×3 IMPLANT
TUBE ANAEROBIC SPECIMEN COL (MISCELLANEOUS) IMPLANT
WATER STERILE IRR 1000ML POUR (IV SOLUTION) ×6 IMPLANT

## 2014-08-12 NOTE — Brief Op Note (Signed)
08/10/2014 - 08/12/2014  12:57 PM  PATIENT:  Dwayne Vargas  69 y.o. male  PRE-OPERATIVE DIAGNOSIS:  left empyema  POST-OPERATIVE DIAGNOSIS:  left empyema  PROCEDURE:  Procedure(s): VIDEO ASSISTED THORACOSCOPY (VATS)/THOROCOTOMY, DRAINAGE OF EMPYEMA (Left) Decortication of left lung  SURGEON:  Surgeon(s) and Role:    * Alleen Borne, MD - Primary  PHYSICIAN ASSISTANT: Doree Fudge PA-C    ANESTHESIA:   general  EBL:  Total I/O In: 2335 [I.V.:1500; Blood:335; IV Piggyback:500] Out: 1200 [Urine:200; Blood:1000]  BLOOD ADMINISTERED:1 unit  PRBC  DRAINS: (1 32 F ) Blake drain(s) in the left pleural space   LOCAL MEDICATIONS USED:  NONE  SPECIMEN:  Source of Specimen:  culture of left pleural fluid and pleural peel  DISPOSITION OF SPECIMEN:  micro  COUNTS:  YES  TOURNIQUET:  * No tourniquets in log *  DICTATION: .Note written in EPIC  PLAN OF CARE: Admit to inpatient   PATIENT DISPOSITION:  PACU - hemodynamically stable.   Delay start of Pharmacological VTE agent (>24hrs) due to surgical blood loss or risk of bleeding: yes

## 2014-08-12 NOTE — Brief Op Note (Signed)
08/10/2014 - 08/12/2014  12:58 PM  PATIENT:  Dwayne Vargas  69 y.o. male  PRE-OPERATIVE DIAGNOSIS: 1.  Large, loculated left pleural effusion 2. Left empyema 3. CAP  POST-OPERATIVE DIAGNOSIS: 1.  Large, loculated left pleural effusion 2. Left empyema 3. CAP  PROCEDURE:  LEFT VIDEO ASSISTED THORACOSCOPY (VATS), LEFT MUSCLE SPARING MINI THOROCOTOMY, DRAINAGE OF EMPYEMA and PLEURAL EFFUSION, and DECORTICATION  SURGEON:  Surgeon(s) and Role:    * Alleen Borne, MD - Primary  PHYSICIAN ASSISTANT: Doree Fudge PA-C  ANESTHESIA:   general  EBL:  Total I/O In: 2335 [I.V.:1500; Blood:335; IV Piggyback:500] Out: 1200 [Urine:200; Blood:1000]  BLOOD ADMINISTERED:One CC PRBC  DRAINS: (2) Blake drain(s) in the left pleural space   SPECIMEN:  Source of Specimen:  Left pleural fluid and left pleural peel  DISPOSITION OF SPECIMEN:  Culture  COUNTS CORRECT:  YES  DICTATION: .Dragon Dictation  PLAN OF CARE: Admit to inpatient   PATIENT DISPOSITION:  PACU - stable   Delay start of Pharmacological VTE agent (>24hrs) due to surgical blood loss or risk of bleeding: yes

## 2014-08-12 NOTE — Transfer of Care (Signed)
Immediate Anesthesia Transfer of Care Note  Patient: Dwayne Vargas  Procedure(s) Performed: Procedure(s): VIDEO ASSISTED THORACOSCOPY (VATS)/THOROCOTOMY, DRAINAGE OF EMPYEMA (Left)  Patient Location: PACU  Anesthesia Type:General  Level of Consciousness: awake and lethargic  Airway & Oxygen Therapy: Patient connected to face mask oxygen  Post-op Assessment: Report given to RN, Post -op Vital signs reviewed and stable and Patient moving all extremities X 4  Post vital signs: Reviewed and stable  Last Vitals:  Filed Vitals:   08/12/14 1315  BP:   Pulse:   Temp: 37 C  Resp:     Complications: No apparent anesthesia complications

## 2014-08-12 NOTE — Anesthesia Procedure Notes (Signed)
Procedure Name: Intubation Date/Time: 08/12/2014 10:33 AM Performed by: Yvonne Kendall S Patient Re-evaluated:Patient Re-evaluated prior to inductionOxygen Delivery Method: Circle system utilized Preoxygenation: Pre-oxygenation with 100% oxygen Intubation Type: IV induction Ventilation: Mask ventilation without difficulty and Oral airway inserted - appropriate to patient size Laryngoscope Size: Mac and 4 Grade View: Grade II Tube type: Oral Endobronchial tube: Double lumen EBT, Left, EBT position confirmed by fiberoptic bronchoscope and EBT position confirmed by auscultation and 41 Fr Number of attempts: 1 Placement Confirmation: ETT inserted through vocal cords under direct vision,  breath sounds checked- equal and bilateral and positive ETCO2 Tube secured with: Tape Dental Injury: Teeth and Oropharynx as per pre-operative assessment

## 2014-08-12 NOTE — Anesthesia Postprocedure Evaluation (Signed)
Anesthesia Post Note  Patient: Dwayne Vargas  Procedure(s) Performed: Procedure(s) (LRB): VIDEO ASSISTED THORACOSCOPY (VATS)/THOROCOTOMY, DRAINAGE OF EMPYEMA (Left)  Anesthesia type: General  Patient location: PACU  Post pain: Pain level controlled and Adequate analgesia  Post assessment: Post-op Vital signs reviewed, Patient's Cardiovascular Status Stable, Respiratory Function Stable, Patent Airway and Pain level controlled  Last Vitals:  Filed Vitals:   08/12/14 1500  BP: 111/53  Pulse: 104  Temp: 37.3 C  Resp:     Post vital signs: Reviewed and stable  Level of consciousness: awake, alert  and oriented  Complications: No apparent anesthesia complications

## 2014-08-12 NOTE — Progress Notes (Signed)
      [address]       [name] [address]             [phone]      S/p decortication  Sleeping at present  BP 111/53 mmHg  Pulse 104  Temp(Src) 99.1 F (37.3 C) (Axillary)  Resp 12  Ht 5\' 11"  (1.803 m)  Wt 232 lb 5.8 oz (105.4 kg)  BMI 32.42 kg/m2  SpO2 100%   Intake/Output Summary (Last 24 hours) at 08/12/14 1941 Last data filed at 08/12/14 1900  Gross per 24 hour  Intake   2695 ml  Output   2510 ml  Net    185 ml    Stable early postop  Viviann Spare C. Dorris Fetch, MD [facility] [phone]

## 2014-08-12 NOTE — Op Note (Signed)
08/12/2014 Dwayne Vargas [id number]  Surgeon: Alleen Borne, MD   First Assistant: Doree Fudge, PA-C  Preoperative Diagnosis: Left empyema   Postoperative Diagnosis: Left empyema   Procedure:  1. Left thoracotomy  2. Drainage of empyema  3. Decortication of the left lung   Anesthesia: General Endotracheal   Clinical History/Surgical Indication:   The patient is a 69 year old diabetic with a long history of smoking until a few months ago who had neck surgery at [facility] in May 2016. He was at home doing well until last Thursday when he began having left sided chest pain, cough, shortness of breath and fever. He felt bad over the weekend and came to the ER yesterday where he had a temp of 101.4 and WBC ct of 27K. CT scan of the chest showed a large loculated left pleural effusion and left lung pneumonia. He had a thoracentesis by IR and only 100 cc of turbid fluid could be removed. WBC ct in the pleural fluid was 59563, 91% neutrophils, with no organisms seen on gram stain. His peripheral WBC ct yesterday was further elevated to 36.4.  He has a large loculated left pleural effusion and left lung consolidation with fever and leukocytosis consistent with pneumonia and empyema. This will require left thoracoscopy and probably thoracotomy for complete drainage and decortication of the left lung. I discussed the surgery with the patient and his daughter including alternatives, benefits and risks including but not limited to bleeding, infection, prolonged air leak, and respiratory failure. He understands and agrees to proceed.  Preparation:  The patient was seen in the preoperative holding area and the correct patient, correct operation, correct operative sidewere confirmed with the patient after reviewing the medical record and CT scan. The consent was signed by me. Preoperative antibiotics were given. The left side of the chest was signed by me. The patient was taken back to the operating room  and positioned supine on the operating room table. After being placed under general endotracheal anesthesia by the anesthesia team using a double lumen tube a foley catheter was placed. The patient was turned into the right lateral decubitus position. The chest was prepped with betadine soap and solution. A surgical time-out was taken and the correct patient,operative side, and operative procedure were confirmed with the nursing and anesthesia staff.   Operative Procedure:  A 1 cm incision was made in the mid-axillary line at about the 8th intercostal space and an 8 mm trocar was inserted into the pleural space. It was immediately apparent that the pleural space was obliterated by the multi-loculated empyema and that this could not be done effectively by the VATS approach. Therefore a short lateral muscle-sparing thoracotomy incision was made and the chest entered through the 5th ICS. The pleural space was filled with a yellowish green multiloculated empyema that was sero-purulent with a thick fibrinous peel over the lung. The empyema was completely drained and the left lung decorticated. The pleural space was very inflamed and hemorrhagic with diffuse oozing of blood from the chest wall. His Hgb yesterday afternoon was 10.8. Therefore the patient was given a unit of PRBC's during the procedure. There was complete expansion of the lung. The chest was irrigated with warm saline. Hemostasis was complete. Two 48 F Bard drains were placed through separate stab incisions and were positioned posteriorly and anteriorly in the pleural space. The ribs were reapproximated with # 2 vicryl pericostal sutures and the muscles closed with continuous 0 vicryl suture. The subcutaneous tissue  was closed with 2-0 vicryl continuous suture. The skin was closed with 3-0 vicryl subcuticular suture. All sponge, needle, and instrument counts were reported correct at the end of the case. Dry sterile dressings were placed over the  incisions and around the chest tubes which were connected to pleurevac suction. The patient was turned supine, extubated,then transported to the PACU in satisfactory and stable condition.

## 2014-08-12 NOTE — Anesthesia Preprocedure Evaluation (Addendum)
Anesthesia Evaluation  Patient identified by MRN, date of birth, ID band Patient awake    Reviewed: Allergy & Precautions, NPO status , Patient's Chart, lab work & pertinent test results  Airway Mallampati: II   Neck ROM: full    Dental   Pulmonary pneumonia -, former smoker,  empyema   + decreased breath sounds      Cardiovascular hypertension, Rhythm:regular Rate:Normal     Neuro/Psych PSYCHIATRIC DISORDERS Bipolar Disorder    GI/Hepatic   Endo/Other  diabetes, Type 2obese  Renal/GU      Musculoskeletal   Abdominal   Peds  Hematology   Anesthesia Other Findings   Reproductive/Obstetrics                            Anesthesia Physical Anesthesia Plan  ASA: III  Anesthesia Plan: General   Post-op Pain Management:    Induction: Intravenous  Airway Management Planned: Double Lumen EBT  Additional Equipment: Arterial line, CVP and Ultrasound Guidance Line Placement  Intra-op Plan:   Post-operative Plan: Extubation in OR and Possible Post-op intubation/ventilation  Informed Consent: I have reviewed the patients History and Physical, chart, labs and discussed the procedure including the risks, benefits and alternatives for the proposed anesthesia with the patient or authorized representative who has indicated his/her understanding and acceptance.     Plan Discussed with: CRNA, Anesthesiologist and Surgeon  Anesthesia Plan Comments:         Anesthesia Quick Evaluation

## 2014-08-12 NOTE — Progress Notes (Signed)
Pt has altered mentation to situation. Grips strong and equal, pupils equal bilaterally.  Dr. Gonzella Lex notified stat CT, CBC and lactic acids ordered. Dr. Laneta Simmers notified wants to proceed with surgery.

## 2014-08-12 NOTE — Progress Notes (Signed)
[facility] PROGRESS NOTE  Dwayne Vargas ZOX:[id number] DOB: 1945/04/09 DOA: 08/10/2014 PCP: Irving Copas, MD  Brief narrative 69 year old male with history of diabetes mellitus, bipolar disorder, hypertension presented with shortness of breath with left-sided chest discomfort and fever for the past 1 week. Patient found to have left-sided pneumonia with large parapneumonic effusion/ complex empyema.   Assessment/Plan: Sepsis secondary to Community acquired pneumonia with parapneumonic effusion - switched antibiotics to cover broadly for healthcare associated pneumonia. (Vancomycin and Zosyn). Worsened leukocytosis. Patient underwent ultrasound-guided thoracentesis with only about 100 cc turbid exudative fluid drained.  fluid wbc of 13k, Culture so far negative. Follow Gram stain, final cultures and cytology. Appreciate  pulmonary evaluation .  VATS drainage per [facility]VS today. Supportive care with Tylenol and pain medications.   Type 2 diabetes mellitus, uncontrolled Monitor on sliding scale insulin. Added bedtime Lantus . Continue Neurontin for peripheral neuropathy. Follow A1c  Bipolar disorder Continue Lamictal  Essential hypertension Continue metoprolol and losartan.  Hyponatremia Possibly SIADH secondary to pneumonia. Monitor closely.  Acute encephalopathy Patient confused since yesterday, unclear why he is in the hospital. Likely triggered by underlying sepsis. Will check head CT.  Diet: Diabetic DVT prophylaxis: SCDs  CODE STATUS: Full code   Family Communication: Daughter Olegario Messier at bedside  Disposition Plan: for OR today. May need ICU level of care postop.   Consultants:  Pulmonary  Cardiothoracic surgery  Procedures:  CT chest with contrast  Ultrasound-guided thoracentesis  Antibiotics:  IV Rocephin and azithromycin 6/14-6/15  IV vancomycin and Zosyn: 6/15--  HPI/Subjective: Patient seen and examined . Reportedly has been more confused  since yesterday evening disoriented to place. Still having temperature spikes. Seen by thoracic surgery with plan on OR today  Objective: Filed Vitals:   08/12/14 0815  BP: 107/58  Pulse:   Temp:   Resp:     Intake/Output Summary (Last 24 hours) at 08/12/14 0920 Last data filed at 08/12/14 0200  Gross per 24 hour  Intake     50 ml  Output    500 ml  Net   -450 ml   Filed Weights   08/10/14 2158 08/12/14 0512  Weight: 106.958 kg (235 lb 12.8 oz) 105.4 kg (232 lb 5.8 oz)    Exam:   General:  Elderly male in no acute distress  HEENT: No pallor, moist oral mucosa  Chest: Diminished left sided breath sounds with basal crackles  CVS: Normal S1 and S2, no murmurs rub or gallop   GI: Soft, nondistended, nontender, bowel sounds present  Musculoskeletal: Warm, no edema  CNS: Alert and orientedX2     Data Reviewed: Basic Metabolic Panel:  Recent Labs Lab 08/10/14 1328 08/11/14 1505  NA 126* 129*  K 4.6 4.5  CL 96* 98*  CO2 20* 23  GLUCOSE 408* 274*  BUN 22* 14  CREATININE 1.11 1.03  CALCIUM 9.0 9.1   Liver Function Tests:  Recent Labs Lab 08/10/14 1435  AST 16  ALT 20  ALKPHOS 109  BILITOT 0.5  PROT 6.6  ALBUMIN 2.8*    Recent Labs Lab 08/10/14 1435  LIPASE 14*   No results for input(s): AMMONIA in the last 168 hours. CBC:  Recent Labs Lab 08/10/14 1328 08/11/14 1505  WBC 27.4* 36.4*  HGB 10.7* 10.8*  HCT 30.7* 31.6*  MCV 82.1 83.4  PLT 300 328   Cardiac Enzymes: No results for input(s): CKTOTAL, CKMB, CKMBINDEX, TROPONINI in the last 168 hours. BNP (last 3 results)  Recent Labs  08/10/14 1320  BNP 198.1*    ProBNP (last 3 results) No results for input(s): PROBNP in the last 8760 hours.  CBG:  Recent Labs Lab 08/11/14 0723 08/11/14 1232 08/11/14 1643 08/11/14 2123 08/12/14 0749  GLUCAP 230* 227* 248* 227* 196*    Recent Results (from the past 240 hour(s))  Blood culture (routine x 2)     Status: None (Preliminary  result)   Collection Time: 08/10/14  2:04 PM  Result Value Ref Range Status   Specimen Description BLOOD RIGHT HAND  Final   Special Requests BOTTLES DRAWN AEROBIC AND ANAEROBIC  5 CC EA  Final   Culture   Final           BLOOD CULTURE RECEIVED NO GROWTH TO DATE CULTURE WILL BE HELD FOR 5 DAYS BEFORE ISSUING A FINAL NEGATIVE REPORT Performed at [facility]    Report Status PENDING  Incomplete  Blood culture (routine x 2)     Status: None (Preliminary result)   Collection Time: 08/10/14  2:05 PM  Result Value Ref Range Status   Specimen Description BLOOD LEFT ANTECUBITAL  Final   Special Requests BOTTLES DRAWN AEROBIC AND ANAEROBIC 5 CC EA  Final   Culture   Final           BLOOD CULTURE RECEIVED NO GROWTH TO DATE CULTURE WILL BE HELD FOR 5 DAYS BEFORE ISSUING A FINAL NEGATIVE REPORT Performed at [facility]    Report Status PENDING  Incomplete  MRSA PCR Screening     Status: None   Collection Time: 08/10/14 10:09 PM  Result Value Ref Range Status   MRSA by PCR NEGATIVE NEGATIVE Final    Comment:        The GeneXpert MRSA Assay (FDA approved for NASAL specimens only), is one component of a comprehensive MRSA colonization surveillance program. It is not intended to diagnose MRSA infection nor to guide or monitor treatment for MRSA infections.   Gram stain     Status: None   Collection Time: 08/11/14 11:25 AM  Result Value Ref Range Status   Specimen Description FLUID LEFT PLEURAL  Final   Special Requests NONE  Final   Gram Stain   Final    ABUNDANT WBC PRESENT, PREDOMINANTLY PMN NO ORGANISMS SEEN    Report Status 08/11/2014 FINAL  Final     Studies: Ct Abdomen Pelvis Wo Contrast  08/10/2014   CLINICAL DATA:  69 year old male with chest pain cough and left upper quadrant pain. Initial encounter.  EXAM: CT ABDOMEN AND PELVIS WITHOUT CONTRAST  TECHNIQUE: Multidetector CT imaging of the abdomen and pelvis was performed following the standard protocol  without IV contrast.  COMPARISON:  Chest radiographs today reported separately.  FINDINGS: Partially visible left lower lobe and lingula consolidation with air bronchograms and superimposed pleural effusion. The effusion extends laterally around the compressed lung parenchyma. No pericardial effusion. No definite cavitary changes.  Mild curvilinear opacity in the right medial lung base, azygoesophageal recess. No right pleural effusion.  No acute or suspicious osseous lesion identified.  No pelvic free fluid. Mildly distended bladder. Negative distal colon.  Redundant sigmoid colon mostly containing gas. Retained stool in the left colon and transverse colon. Retained stool at the hepatic flexure and ascending colon. Negative appendix and terminal ileum. No dilated small bowel. Oral contrast has not yet reached the distal small bowel. Negative stomach and duodenum.  Surgically absent gallbladder. Non contrast liver, spleen, pancreas and adrenal glands are within normal limits. No abdominal free  fluid. No hydronephrosis or hydroureter. Punctate left lower pole renal calculus. Mild nonspecific perinephric stranding. Right lower pole exophytic cyst with simple fluid densitometry. Aortoiliac calcified atherosclerosis noted. No lymphadenopathy in the abdomen or pelvis.  IMPRESSION: 1. Abnormal visualized left lower lobe and lingula with a combination of multilobar consolidation, and also compression of the left lung by a complex or loculated pleural effusion. This might all be related to pneumonia, but obstructing left hilar lung mass is also a consideration. Chest CT with IV contrast is recommended when possible to further characterize. 2. No acute or inflammatory findings in the abdomen or pelvis.   Electronically Signed   By: Odessa Fleming M.D.   On: 08/10/2014 16:07   Dg Chest 1 View  08/11/2014   CLINICAL DATA:  Community acquired pneumonia. Left pleural effusion. Status post thoracentesis.  EXAM: CHEST  1 VIEW   COMPARISON:  None.  FINDINGS: A large left pleural effusion is seen with atelectasis or consolidation in the left mid and lower lung field. No pneumothorax visualized.  Right lung remains grossly clear. Question tiny right basilar pleural effusion versus pleural thickening. Heart size remains within normal limits.  IMPRESSION: Large left pleural effusion with left mid and lower lung atelectasis versus consolidation. No pneumothorax visualized.   Electronically Signed   By: Myles Rosenthal M.D.   On: 08/11/2014 12:04   Dg Chest 2 View  08/10/2014   CLINICAL DATA:  Chest discomfort and cough for approximately 2 weeks. Elevated white blood cell count.  EXAM: CHEST  2 VIEW  COMPARISON:  None.  FINDINGS: The patient has a moderate to moderately large left pleural effusion with associated left basilar airspace disease. The right lung appears clear. No pneumothorax is identified. Heart size is normal.  IMPRESSION: Moderate to moderately large left pleural effusion with left basilar airspace disease worrisome for pneumonia given elevated white blood cell count. Follow-up to clearing is recommended.   Electronically Signed   By: Drusilla Kanner M.D.   On: 08/10/2014 13:51   Ct Head Wo Contrast  08/12/2014   CLINICAL DATA:  Altered mental status for 1 day  EXAM: CT HEAD WITHOUT CONTRAST  TECHNIQUE: Contiguous axial images were obtained from the base of the skull through the vertex without intravenous contrast.  COMPARISON:  None.  FINDINGS: There is age related volume loss. There is no intracranial mass hemorrhage, extra-axial fluid collection, or midline shift. There is slight small vessel disease in the centra semiovale bilaterally. Elsewhere gray-white compartments appear normal. There is no demonstrable acute infarct. The bony calvarium appears intact. The mastoid air cells are clear. There is rightward deviation of the nasal septum.  IMPRESSION: Age related volume loss with rather minimal periventricular small vessel  disease. No intracranial mass, hemorrhage, or evidence suggesting acute infarct.   Electronically Signed   By: Bretta Bang III M.D.   On: 08/12/2014 09:13   Ct Chest Wo Contrast  08/10/2014   CLINICAL DATA:  Cough for 2 weeks. Abnormal chest x-ray findings. Leukocytosis  EXAM: CT CHEST WITHOUT CONTRAST  TECHNIQUE: Multidetector CT imaging of the chest was performed following the standard protocol without IV contrast.  COMPARISON:  08/10/2014 abdominal CT  FINDINGS: THORACIC INLET/BODY WALL:  No acute abnormality.  MEDIASTINUM:  Normal heart size. No pericardial effusion. Atherosclerosis, including the coronary arteries. Mild enlargement of mediastinal lymph nodes, presumably reactive. No acute vascular findings.  LUNG WINDOWS:  There is extensive opacification and volume loss in the left lower lobe and lingula. Large left  pleural effusion with scalloped margins. Although nodular in appearance, thickening along the upper mediastinum pleural surface appears low-density and is likely loculated fluid. Interlobular septal thickening in the left upper lobe which has a smooth appearance and is likely from compression. Patent central airways.  Subpleural density along the medial right lower lobe is along osteophytes and most consistent with scarring. Calcified granuloma in the right lower lobe.  UPPER ABDOMEN:  No acute findings.  OSSEOUS:  No acute fracture.  No suspicious lytic or blastic lesions.  IMPRESSION: Left chest disease is consistent with pneumonia and large complex effusion/empyema. An underlying lung lesion could easily be obscured on this noncontrast examination, and pleural fluid sampling and imaging follow-up is recommended.   Electronically Signed   By: Marnee Spring M.D.   On: 08/10/2014 16:47   US Thoracentesis Asp Pleural Space W/img Guide  08/11/2014   INDICATION: Symptomatic L sided pleural effusion  EXAM: US THORACENTESIS ASP PLEURAL SPACE W/IMG GUIDE  COMPARISON:  None.  MEDICATIONS: 10  cc 1% lidocaine  COMPLICATIONS: None immediate  TECHNIQUE: Informed written consent was obtained from the patient after a discussion of the risks, benefits and alternatives to treatment. A timeout was performed prior to the initiation of the procedure.  Initial ultrasound scanning demonstrates a left pleural effusion. The lower chest was prepped and draped in the usual sterile fashion. 1% lidocaine was used for local anesthesia.  Under direct ultrasound guidance, a 19 gauge, 7-cm, Yueh catheter was introduced. An ultrasound image was saved for documentation purposes. The thoracentesis was performed. The catheter was removed and a dressing was applied. The patient tolerated the procedure well without immediate post procedural complication. The patient was escorted to have an upright chest radiograph.  FINDINGS: A total of approximately 100 cc of brown, cloudy fluid was removed. Requested samples were sent to the laboratory.  IMPRESSION: Successful ultrasound-guided L sided thoracentesis yielding 100 cc of pleural fluid.  Read by:  Robet Leu [facility]   Electronically Signed   By: Corlis Leak M.D.   On: 08/11/2014 11:53    Scheduled Meds: . gabapentin  300 mg Oral Daily  . gabapentin  600 mg Oral QHS  . insulin aspart  0-15 Units Subcutaneous TID WC  . insulin aspart  0-5 Units Subcutaneous QHS  . insulin aspart  0-5 Units Subcutaneous QHS  . insulin glargine  10 Units Subcutaneous QHS  . lamoTRIgine  200 mg Oral QHS  . losartan  25 mg Oral Daily  . metoprolol tartrate  25 mg Oral Daily  . OxyCODONE  60 mg Oral Q12H  . piperacillin-tazobactam (ZOSYN)  IV  3.375 g Intravenous Q8H  . vancomycin  750 mg Intravenous Q12H  . venlafaxine XR  300 mg Oral Daily   Continuous Infusions:     Time spent: 25 minutes    Eddie North  [facility] Pager [phone] If 7PM-7AM, please contact night-coverage at [email or web address], password [facility] 08/12/2014, 9:20 AM  LOS: 2 days

## 2014-08-12 NOTE — Progress Notes (Signed)
Name: Dwayne Vargas MRN: [id number] DOB: 12/03/1945    ADMISSION DATE:  08/10/2014 CONSULTATION DATE:  08/11/14  REFERRING MD :  Dr. Gonzella Lex / [facility]   CHIEF COMPLAINT:  Night sweats, SOB, Cough & Chest Pain   BRIEF PATIENT DESCRIPTION: 69 y/o M, former, smoker, admitted 6/14 with a 2 week + history of night sweats, SOB, cough and chest pain.  Work up consistent with a loculated L pleural effusion.  PCCM consulted for evaluation.   SIGNIFICANT EVENTS  5/03  ACDF at [facility] 6/14  Admit with 2 week + history of night sweats, SOB, cough with productive green sputum and left sided chest pain.   6/16 to OR>>L thoracotomy with decortication and drainage of empyema    STUDIES:  6/15  Chest CT >> left compressive atx, LLL PNA, large L complex pleural effusion  SUBJECTIVE:  Confused post op.  Sats good on 4L [place].    VITAL SIGNS: Temp:  [98.5 F (36.9 C)-100.4 F (38 C)] 98.6 F (37 C) (06/16 1430) Pulse Rate:  [88-107] 105 (06/16 1430) Resp:  [17-29] 29 (06/16 1430) BP: (96-150)/(55-102) 113/71 mmHg (06/16 1430) SpO2:  [92 %-96 %] 94 % (06/16 1430) Arterial Line BP: (144-170)/(47-53) 156/52 mmHg (06/16 1430) Weight:  [232 lb 5.8 oz (105.4 kg)] 232 lb 5.8 oz (105.4 kg) (06/16 0512)  PHYSICAL EXAMINATION: General:  wdwn adult male in NAD post VATS Neuro:  Confused post op, thinks he is on a baot.  C/o "salt water" in his eyes.  Follows commands.  MAE, non focal  HEENT:  MM pink/moist, no jvd Cardiovascular:  s1s2 rrr, no m/r/g Lungs:  resp's even/non-labored on 4L , few scattered rhonchi L>R, L chest tube with sanguinous drainage Abdomen:  Obese/soft, bsx4 active Musculoskeletal:  No acute deformities  Skin:  Warm/dry, no edema    Recent Labs Lab 08/10/14 1328 08/11/14 1505 08/12/14 1221  NA 126* 129* 131*  K 4.6 4.5 4.8  CL 96* 98*  --   CO2 20* 23  --   BUN 22* 14  --   CREATININE 1.11 1.03  --   GLUCOSE 408* 274*  --     Recent Labs Lab 08/10/14 1328 08/11/14 1505  08/12/14 1221  HGB 10.7* 10.8* 9.5*  HCT 30.7* 31.6* 28.0*  WBC 27.4* 36.4*  --   PLT 300 328  --    Ct Abdomen Pelvis Wo Contrast  08/10/2014   CLINICAL DATA:  69 year old male with chest pain cough and left upper quadrant pain. Initial encounter.  EXAM: CT ABDOMEN AND PELVIS WITHOUT CONTRAST  TECHNIQUE: Multidetector CT imaging of the abdomen and pelvis was performed following the standard protocol without IV contrast.  COMPARISON:  Chest radiographs today reported separately.  FINDINGS: Partially visible left lower lobe and lingula consolidation with air bronchograms and superimposed pleural effusion. The effusion extends laterally around the compressed lung parenchyma. No pericardial effusion. No definite cavitary changes.  Mild curvilinear opacity in the right medial lung base, azygoesophageal recess. No right pleural effusion.  No acute or suspicious osseous lesion identified.  No pelvic free fluid. Mildly distended bladder. Negative distal colon.  Redundant sigmoid colon mostly containing gas. Retained stool in the left colon and transverse colon. Retained stool at the hepatic flexure and ascending colon. Negative appendix and terminal ileum. No dilated small bowel. Oral contrast has not yet reached the distal small bowel. Negative stomach and duodenum.  Surgically absent gallbladder. Non contrast liver, spleen, pancreas and adrenal glands are within normal limits.  No abdominal free fluid. No hydronephrosis or hydroureter. Punctate left lower pole renal calculus. Mild nonspecific perinephric stranding. Right lower pole exophytic cyst with simple fluid densitometry. Aortoiliac calcified atherosclerosis noted. No lymphadenopathy in the abdomen or pelvis.  IMPRESSION: 1. Abnormal visualized left lower lobe and lingula with a combination of multilobar consolidation, and also compression of the left lung by a complex or loculated pleural effusion. This might all be related to pneumonia, but obstructing  left hilar lung mass is also a consideration. Chest CT with IV contrast is recommended when possible to further characterize. 2. No acute or inflammatory findings in the abdomen or pelvis.   Electronically Signed   By: Odessa Fleming M.D.   On: 08/10/2014 16:07   Dg Chest 1 View  08/11/2014   CLINICAL DATA:  Community acquired pneumonia. Left pleural effusion. Status post thoracentesis.  EXAM: CHEST  1 VIEW  COMPARISON:  None.  FINDINGS: A large left pleural effusion is seen with atelectasis or consolidation in the left mid and lower lung field. No pneumothorax visualized.  Right lung remains grossly clear. Question tiny right basilar pleural effusion versus pleural thickening. Heart size remains within normal limits.  IMPRESSION: Large left pleural effusion with left mid and lower lung atelectasis versus consolidation. No pneumothorax visualized.   Electronically Signed   By: Myles Rosenthal M.D.   On: 08/11/2014 12:04   Ct Head Wo Contrast  08/12/2014   CLINICAL DATA:  Altered mental status for 1 day  EXAM: CT HEAD WITHOUT CONTRAST  TECHNIQUE: Contiguous axial images were obtained from the base of the skull through the vertex without intravenous contrast.  COMPARISON:  None.  FINDINGS: There is age related volume loss. There is no intracranial mass hemorrhage, extra-axial fluid collection, or midline shift. There is slight small vessel disease in the centra semiovale bilaterally. Elsewhere gray-white compartments appear normal. There is no demonstrable acute infarct. The bony calvarium appears intact. The mastoid air cells are clear. There is rightward deviation of the nasal septum.  IMPRESSION: Age related volume loss with rather minimal periventricular small vessel disease. No intracranial mass, hemorrhage, or evidence suggesting acute infarct.   Electronically Signed   By: Bretta Bang III M.D.   On: 08/12/2014 09:13   Ct Chest Wo Contrast  08/10/2014   CLINICAL DATA:  Cough for 2 weeks. Abnormal chest x-ray  findings. Leukocytosis  EXAM: CT CHEST WITHOUT CONTRAST  TECHNIQUE: Multidetector CT imaging of the chest was performed following the standard protocol without IV contrast.  COMPARISON:  08/10/2014 abdominal CT  FINDINGS: THORACIC INLET/BODY WALL:  No acute abnormality.  MEDIASTINUM:  Normal heart size. No pericardial effusion. Atherosclerosis, including the coronary arteries. Mild enlargement of mediastinal lymph nodes, presumably reactive. No acute vascular findings.  LUNG WINDOWS:  There is extensive opacification and volume loss in the left lower lobe and lingula. Large left pleural effusion with scalloped margins. Although nodular in appearance, thickening along the upper mediastinum pleural surface appears low-density and is likely loculated fluid. Interlobular septal thickening in the left upper lobe which has a smooth appearance and is likely from compression. Patent central airways.  Subpleural density along the medial right lower lobe is along osteophytes and most consistent with scarring. Calcified granuloma in the right lower lobe.  UPPER ABDOMEN:  No acute findings.  OSSEOUS:  No acute fracture.  No suspicious lytic or blastic lesions.  IMPRESSION: Left chest disease is consistent with pneumonia and large complex effusion/empyema. An underlying lung lesion could easily be obscured  on this noncontrast examination, and pleural fluid sampling and imaging follow-up is recommended.   Electronically Signed   By: Marnee Spring M.D.   On: 08/10/2014 16:47   Dg Chest Port 1 View  08/12/2014   CLINICAL DATA:  Empyema drainage postop  EXAM: PORTABLE CHEST - 1 VIEW  COMPARISON:  08/11/2014  FINDINGS: Interval placement of 2 chest tubes on the left with partial drainage of left loculated pleural effusion. No pneumothorax. Increase in left upper lobe airspace disease. Partial re-expansion left lower lobe.  Right jugular central venous catheter tip in the SVC. No pneumothorax. Right lung clear.  IMPRESSION:  Interval placement of 2 chest tubes on the left with partial drainage of left loculated pleural effusion. No pneumothorax.   Electronically Signed   By: Marlan Palau M.D.   On: 08/12/2014 13:46   US Thoracentesis Asp Pleural Space W/img Guide  08/11/2014   INDICATION: Symptomatic L sided pleural effusion  EXAM: US THORACENTESIS ASP PLEURAL SPACE W/IMG GUIDE  COMPARISON:  None.  MEDICATIONS: 10 cc 1% lidocaine  COMPLICATIONS: None immediate  TECHNIQUE: Informed written consent was obtained from the patient after a discussion of the risks, benefits and alternatives to treatment. A timeout was performed prior to the initiation of the procedure.  Initial ultrasound scanning demonstrates a left pleural effusion. The lower chest was prepped and draped in the usual sterile fashion. 1% lidocaine was used for local anesthesia.  Under direct ultrasound guidance, a 19 gauge, 7-cm, Yueh catheter was introduced. An ultrasound image was saved for documentation purposes. The thoracentesis was performed. The catheter was removed and a dressing was applied. The patient tolerated the procedure well without immediate post procedural complication. The patient was escorted to have an upright chest radiograph.  FINDINGS: A total of approximately 100 cc of brown, cloudy fluid was removed. Requested samples were sent to the laboratory.  IMPRESSION: Successful ultrasound-guided L sided thoracentesis yielding 100 cc of pleural fluid.  Read by:  Robet Leu [facility]   Electronically Signed   By: Corlis Leak M.D.   On: 08/11/2014 11:53    ASSESSMENT / PLAN:   CAP Left Empyema - s/p thoracotomy / decortication 6/16 PLAN -  Post op/chest tube mgmt per CVTS  BD Aggressive pulmonary hygiene - will need adequate pain control  Continue vanc, zosyn per pharmacy  F/u CBC  F/u ABG  F/u CXR  Follow cultures    Hyponatremia  PLAN -  F/u chem   DM  PLAN -  SSI    Dirk Dress, NP 08/12/2014  3:46 PM Pager: [phone]  or [phone]   Attending:  I have seen and examined the patient with nurse practitioner/resident and agree with the note above.   Left lung sounds better after surgery, he is breathing comfortably, pain controlled.  Post op chest xray shows chest tubes in satisfactory position.    Continue current antibiotics as above until cultures from VATS back; consider stopping Vanc in 48 hours  Heber [place], MD [place] [facility] Pager: [phone] Cell: [phone] After 3pm or if no response, call [phone]

## 2014-08-13 ENCOUNTER — Encounter (HOSPITAL_COMMUNITY): Payer: Self-pay | Admitting: Surgery

## 2014-08-13 ENCOUNTER — Inpatient Hospital Stay (HOSPITAL_COMMUNITY): Payer: Medicare PPO

## 2014-08-13 DIAGNOSIS — E119 Type 2 diabetes mellitus without complications: Secondary | ICD-10-CM

## 2014-08-13 LAB — POCT I-STAT 3, ART BLOOD GAS (G3+)
Bicarbonate: 25.1 mEq/L — ABNORMAL HIGH (ref 20.0–24.0)
O2 SAT: 95 %
PCO2 ART: 39.5 mmHg (ref 35.0–45.0)
PO2 ART: 73 mmHg — AB (ref 80.0–100.0)
Patient temperature: 98.3
TCO2: 26 mmol/L (ref 0–100)
pH, Arterial: 7.41 (ref 7.350–7.450)

## 2014-08-13 LAB — CBC
HCT: 25.6 % — ABNORMAL LOW (ref 39.0–52.0)
Hemoglobin: 8.7 g/dL — ABNORMAL LOW (ref 13.0–17.0)
MCH: 28.6 pg (ref 26.0–34.0)
MCHC: 34 g/dL (ref 30.0–36.0)
MCV: 84.2 fL (ref 78.0–100.0)
Platelets: 339 10*3/uL (ref 150–400)
RBC: 3.04 MIL/uL — AB (ref 4.22–5.81)
RDW: 13.5 % (ref 11.5–15.5)
WBC: 22.2 10*3/uL — AB (ref 4.0–10.5)

## 2014-08-13 LAB — GLUCOSE, CAPILLARY
GLUCOSE-CAPILLARY: 100 mg/dL — AB (ref 65–99)
GLUCOSE-CAPILLARY: 110 mg/dL — AB (ref 65–99)
GLUCOSE-CAPILLARY: 141 mg/dL — AB (ref 65–99)
GLUCOSE-CAPILLARY: 162 mg/dL — AB (ref 65–99)
GLUCOSE-CAPILLARY: 173 mg/dL — AB (ref 65–99)
Glucose-Capillary: 110 mg/dL — ABNORMAL HIGH (ref 65–99)
Glucose-Capillary: 173 mg/dL — ABNORMAL HIGH (ref 65–99)

## 2014-08-13 LAB — BASIC METABOLIC PANEL
Anion gap: 7 (ref 5–15)
BUN: 15 mg/dL (ref 6–20)
CALCIUM: 8.4 mg/dL — AB (ref 8.9–10.3)
CHLORIDE: 102 mmol/L (ref 101–111)
CO2: 26 mmol/L (ref 22–32)
CREATININE: 0.83 mg/dL (ref 0.61–1.24)
GFR calc non Af Amer: >60 mL/min (ref >60)
Glucose, Bld: 99 mg/dL (ref 65–99)
Potassium: 4 mmol/L (ref 3.5–5.1)
Sodium: 135 mmol/L (ref 135–145)

## 2014-08-13 MED ORDER — INSULIN DETEMIR 100 UNIT/ML ~~LOC~~ SOLN
20.0000 [IU] | Freq: Two times a day (BID) | SUBCUTANEOUS | Status: DC
  Administered 2014-08-13: 20 [IU] via SUBCUTANEOUS
  Administered 2014-08-13: 10 [IU] via SUBCUTANEOUS
  Administered 2014-08-14 – 2014-08-15 (×3): 20 [IU] via SUBCUTANEOUS
  Filled 2014-08-13 (×8): qty 0.2

## 2014-08-13 MED ORDER — METOPROLOL TARTRATE 1 MG/ML IV SOLN
INTRAVENOUS | Status: AC
  Administered 2014-08-13: 5 mg via INTRAVENOUS
  Filled 2014-08-13: qty 5

## 2014-08-13 MED ORDER — DEXTROSE 5 % IV SOLN
5.0000 mg/h | INTRAVENOUS | Status: DC
  Administered 2014-08-13 (×2): 5 mg/h via INTRAVENOUS
  Filled 2014-08-13: qty 100

## 2014-08-13 MED ORDER — DILTIAZEM LOAD VIA INFUSION
10.0000 mg | Freq: Once | INTRAVENOUS | Status: AC
  Administered 2014-08-13: 10 mg via INTRAVENOUS
  Filled 2014-08-13: qty 10

## 2014-08-13 MED ORDER — KETOROLAC TROMETHAMINE 15 MG/ML IJ SOLN
15.0000 mg | Freq: Four times a day (QID) | INTRAMUSCULAR | Status: AC | PRN
  Administered 2014-08-13 – 2014-08-15 (×5): 15 mg via INTRAVENOUS
  Filled 2014-08-13 (×5): qty 1

## 2014-08-13 MED ORDER — INSULIN DETEMIR 100 UNIT/ML ~~LOC~~ SOLN
10.0000 [IU] | Freq: Two times a day (BID) | SUBCUTANEOUS | Status: DC
  Filled 2014-08-13 (×2): qty 0.1

## 2014-08-13 MED ORDER — METOPROLOL TARTRATE 1 MG/ML IV SOLN
5.0000 mg | Freq: Once | INTRAVENOUS | Status: AC
  Administered 2014-08-13: 5 mg via INTRAVENOUS

## 2014-08-13 MED ORDER — AMIODARONE HCL IN DEXTROSE 360-4.14 MG/200ML-% IV SOLN
INTRAVENOUS | Status: AC
  Filled 2014-08-13: qty 200

## 2014-08-13 NOTE — Progress Notes (Signed)
CT surgery p.m. Rounds  Patient had drainage of empyema-decortication yesterday Breathing comfortably No significant air leak from chest tubes with expected drainage Continue broad-spectrum anabiotic's pending results of intraoperative cultures

## 2014-08-13 NOTE — Progress Notes (Signed)
Name: Dwayne Vargas MRN: [id number] DOB: [birth year]    ADMISSION DATE:  08/10/2014 CONSULTATION DATE:  08/11/14  REFERRING MD :  Dr. Gonzella Lex / [facility]   CHIEF COMPLAINT:  Night sweats, SOB, Cough & Chest Pain   BRIEF PATIENT DESCRIPTION: 69 y/o M, former, smoker, admitted 6/14 with a 2 week + history of night sweats, SOB, cough and chest pain.  Work up consistent with a loculated L pleural effusion.  PCCM consulted for evaluation.   SIGNIFICANT EVENTS  5/03  ACDF at [facility] 6/14  Admit with 2 week + history of night sweats, SOB, cough with productive green sputum and left sided chest pain.   6/16 to OR>>L thoracotomy with decortication and drainage of empyema    STUDIES:  6/15  Chest CT >> left compressive atx, LLL PNA, large L complex pleural effusion  SUBJECTIVE:  Feeling OK, hypoglycemia spell this morning AFib with RVR overnight  VITAL SIGNS: Temp:  [98.2 F (36.8 C)-99.1 F (37.3 C)] 98.2 F (36.8 C) (06/17 0752) Pulse Rate:  [83-105] 92 (06/17 0700) Resp:  [12-29] 17 (06/17 0700) BP: (105-166)/(48-102) 160/71 mmHg (06/17 0700) SpO2:  [94 %-100 %] 100 % (06/17 0700) Arterial Line BP: (132-204)/(47-63) 196/58 mmHg (06/17 0700)  PHYSICAL EXAMINATION: General:  No distress Neuro:  Awake, alert, no distress, still mildly disoriented but conversant, calm HEENT:  NCAT, MMM, EOMi Cardiovascular:  RRR Lungs:  Normal respiratory effort, diminished left side Abdomen:  Soft, nontender, BS+ Musculoskeletal:  No acute deformities  Skin:  Warm/dry, no edema    Recent Labs Lab 08/10/14 1328 08/11/14 1505 08/12/14 1221 08/13/14 0400  NA 126* 129* 131* 135  K 4.6 4.5 4.8 4.0  CL 96* 98*  --  102  CO2 20* 23  --  26  BUN 22* 14  --  15  CREATININE 1.11 1.03  --  0.83  GLUCOSE 408* 274*  --  99    Recent Labs Lab 08/10/14 1328 08/11/14 1505 08/12/14 1221 08/13/14 0400  HGB 10.7* 10.8* 9.5* 8.7*  HCT 30.7* 31.6* 28.0* 25.6*  WBC 27.4* 36.4*  --  22.2*  PLT 300 328  --   339   Dg Chest 1 View  08/11/2014   CLINICAL DATA:  Community acquired pneumonia. Left pleural effusion. Status post thoracentesis.  EXAM: CHEST  1 VIEW  COMPARISON:  None.  FINDINGS: A large left pleural effusion is seen with atelectasis or consolidation in the left mid and lower lung field. No pneumothorax visualized.  Right lung remains grossly clear. Question tiny right basilar pleural effusion versus pleural thickening. Heart size remains within normal limits.  IMPRESSION: Large left pleural effusion with left mid and lower lung atelectasis versus consolidation. No pneumothorax visualized.   Electronically Signed   By: Myles Rosenthal M.D.   On: 08/11/2014 12:04   Ct Head Wo Contrast  08/12/2014   CLINICAL DATA:  Altered mental status for 1 day  EXAM: CT HEAD WITHOUT CONTRAST  TECHNIQUE: Contiguous axial images were obtained from the base of the skull through the vertex without intravenous contrast.  COMPARISON:  None.  FINDINGS: There is age related volume loss. There is no intracranial mass hemorrhage, extra-axial fluid collection, or midline shift. There is slight small vessel disease in the centra semiovale bilaterally. Elsewhere gray-white compartments appear normal. There is no demonstrable acute infarct. The bony calvarium appears intact. The mastoid air cells are clear. There is rightward deviation of the nasal septum.  IMPRESSION: Age related volume loss with rather  minimal periventricular small vessel disease. No intracranial mass, hemorrhage, or evidence suggesting acute infarct.   Electronically Signed   By: Bretta Bang III M.D.   On: 08/12/2014 09:13   Dg Chest Port 1 View  08/13/2014   CLINICAL DATA:  Empyema drainage.  EXAM: PORTABLE CHEST - 1 VIEW  COMPARISON:  08/12/2014.  CT 08/10/2014  FINDINGS: Right IJ line and 2 left chest tubes in stable position. Interval drainage of left pleural fluid collection. Mild residual pleural thickening. Diffuse left lung infiltrate with left lower  lobe atelectasis. Right lung clear. Stable cardiomegaly.  IMPRESSION: 1. Line and tubes in stable position. Two chest tubes noted over the left chest. No pneumothorax. Interim drainage of loculated left pleural fluid collection with mild residual pleural thickening. 2. Persistent left lung infiltrate and left lower lobe atelectasis.   Electronically Signed   By: Maisie Fus  Register   On: 08/13/2014 08:17   Dg Chest Port 1 View  08/12/2014   CLINICAL DATA:  Empyema drainage postop  EXAM: PORTABLE CHEST - 1 VIEW  COMPARISON:  08/11/2014  FINDINGS: Interval placement of 2 chest tubes on the left with partial drainage of left loculated pleural effusion. No pneumothorax. Increase in left upper lobe airspace disease. Partial re-expansion left lower lobe.  Right jugular central venous catheter tip in the SVC. No pneumothorax. Right lung clear.  IMPRESSION: Interval placement of 2 chest tubes on the left with partial drainage of left loculated pleural effusion. No pneumothorax.   Electronically Signed   By: Marlan Palau M.D.   On: 08/12/2014 13:46   US Thoracentesis Asp Pleural Space W/img Guide  08/11/2014   INDICATION: Symptomatic L sided pleural effusion  EXAM: US THORACENTESIS ASP PLEURAL SPACE W/IMG GUIDE  COMPARISON:  None.  MEDICATIONS: 10 cc 1% lidocaine  COMPLICATIONS: None immediate  TECHNIQUE: Informed written consent was obtained from the patient after a discussion of the risks, benefits and alternatives to treatment. A timeout was performed prior to the initiation of the procedure.  Initial ultrasound scanning demonstrates a left pleural effusion. The lower chest was prepped and draped in the usual sterile fashion. 1% lidocaine was used for local anesthesia.  Under direct ultrasound guidance, a 19 gauge, 7-cm, Yueh catheter was introduced. An ultrasound image was saved for documentation purposes. The thoracentesis was performed. The catheter was removed and a dressing was applied. The patient tolerated the  procedure well without immediate post procedural complication. The patient was escorted to have an upright chest radiograph.  FINDINGS: A total of approximately 100 cc of brown, cloudy fluid was removed. Requested samples were sent to the laboratory.  IMPRESSION: Successful ultrasound-guided L sided thoracentesis yielding 100 cc of pleural fluid.  Read by:  Robet Leu [facility]   Electronically Signed   By: Corlis Leak M.D.   On: 08/11/2014 11:53    ASSESSMENT / PLAN:   CAP Left Empyema - s/p thoracotomy / decortication 6/16 > improved PLAN -  Post op/chest tube mgmt per CVTS  Continue bronchodilators Out of bed, incentive spirometry Vanc 6/15 > consider stopping over weekend if culture negative and WBC improved Zosyn 6/17 >   Culture data reviewed  Daily CXR   Follow cultures    Hyponatremia  >SIADH related to pulmonary process, resolved PLAN -  Monitor BMET and UOP Replace electrolytes as needed   DM : erratic overnight,  PLAN -  Detemir 10 q12h Resistant scale SSI   Will be available prn over weekend  Heber Onton, MD   PCCM Pager: [phone] Cell: [phone] After 3pm or if no response, call [phone]

## 2014-08-13 NOTE — Progress Notes (Signed)
Patient HR into 150-170. Dr. Dorris Fetch on unit and notified and new orders received to give 5mg  IV Lopressor. Will continue to monitor. Franki Cabot, RN

## 2014-08-13 NOTE — Progress Notes (Signed)
ANTIBIOTIC CONSULT NOTE - FOLLOW UP  Pharmacy Consult for Vancomycin/Zosyn Indication: pneumonia/Empyema  Labs:  Recent Labs  08/10/14 1328 08/11/14 1505 08/12/14 1221 08/13/14 0400  WBC 27.4* 36.4*  --  22.2*  HGB 10.7* 10.8* 9.5* 8.7*  PLT 300 328  --  339  CREATININE 1.11 1.03  --  0.83   Microbiology: Recent Results (from the past 720 hour(s))  Blood culture (routine x 2)     Status: None (Preliminary result)   Collection Time: 08/10/14  2:04 PM  Result Value Ref Range Status   Specimen Description BLOOD RIGHT HAND  Final   Special Requests BOTTLES DRAWN AEROBIC AND ANAEROBIC  5 CC EA  Final   Culture   Final           BLOOD CULTURE RECEIVED NO GROWTH TO DATE CULTURE WILL BE HELD FOR 5 DAYS BEFORE ISSUING A FINAL NEGATIVE REPORT Performed at [facility]    Report Status PENDING  Incomplete  Blood culture (routine x 2)     Status: None (Preliminary result)   Collection Time: 08/10/14  2:05 PM  Result Value Ref Range Status   Specimen Description BLOOD LEFT ANTECUBITAL  Final   Special Requests BOTTLES DRAWN AEROBIC AND ANAEROBIC 5 CC EA  Final   Culture   Final           BLOOD CULTURE RECEIVED NO GROWTH TO DATE CULTURE WILL BE HELD FOR 5 DAYS BEFORE ISSUING A FINAL NEGATIVE REPORT Performed at [facility]    Report Status PENDING  Incomplete  MRSA PCR Screening     Status: None   Collection Time: 08/10/14 10:09 PM  Result Value Ref Range Status   MRSA by PCR NEGATIVE NEGATIVE Final    Comment:        The GeneXpert MRSA Assay (FDA approved for NASAL specimens only), is one component of a comprehensive MRSA colonization surveillance program. It is not intended to diagnose MRSA infection nor to guide or monitor treatment for MRSA infections.   Culture, body fluid-bottle     Status: None (Preliminary result)   Collection Time: 08/11/14 11:25 AM  Result Value Ref Range Status   Specimen Description FLUID LEFT PLEURAL  Final   Special Requests  NONE  Final   Culture NO GROWTH 1 DAY  Final   Report Status PENDING  Incomplete  Gram stain     Status: None   Collection Time: 08/11/14 11:25 AM  Result Value Ref Range Status   Specimen Description FLUID LEFT PLEURAL  Final   Special Requests NONE  Final   Gram Stain   Final    ABUNDANT WBC PRESENT, PREDOMINANTLY PMN NO ORGANISMS SEEN    Report Status 08/11/2014 FINAL  Final  Culture, Urine     Status: None   Collection Time: 08/11/14 12:15 PM  Result Value Ref Range Status   Specimen Description URINE, RANDOM  Final   Special Requests Normal  Final   Culture NO GROWTH 1 DAY  Final   Report Status 08/12/2014 FINAL  Final  Gram stain     Status: None   Collection Time: 08/12/14 11:12 AM  Result Value Ref Range Status   Specimen Description FLUID LEFT PLEURAL  Final   Special Requests NONE  Final   Gram Stain   Final    CYTOSPIN SMEAR WBC PRESENT, PREDOMINANTLY PMN NO ORGANISMS SEEN    Report Status 08/12/2014 FINAL  Final  Tissue culture     Status: None (  Preliminary result)   Collection Time: 08/12/14 11:25 AM  Result Value Ref Range Status   Specimen Description TISSUE  Final   Special Requests LEFT PLEURAL PEEL SPEC B  Final   Gram Stain   Final    FEW WBC PRESENT,BOTH PMN AND MONONUCLEAR NO ORGANISMS SEEN Performed at [facility]    Culture NO GROWTH Performed at [facility]   Final   Report Status PENDING  Incomplete    Anti-infectives    Start     Dose/Rate Route Frequency Ordered Stop   08/12/14 0100  vancomycin (VANCOCIN) IVPB 750 mg/150 ml premix     750 mg 150 mL/hr over 60 Minutes Intravenous Every 12 hours 08/11/14 1219     08/11/14 1800  cefTRIAXone (ROCEPHIN) 1 g in dextrose 5 % 50 mL IVPB  Status:  Discontinued     1 g 100 mL/hr over 30 Minutes Intravenous Every 24 hours 08/10/14 1718 08/11/14 1143   08/11/14 1700  azithromycin (ZITHROMAX) 500 mg in dextrose 5 % 250 mL IVPB  Status:  Discontinued     500 mg 250 mL/hr over 60  Minutes Intravenous Every 24 hours 08/10/14 1717 08/11/14 1143   08/11/14 1300  piperacillin-tazobactam (ZOSYN) IVPB 3.375 g     3.375 g 12.5 mL/hr over 240 Minutes Intravenous Every 8 hours 08/11/14 1219     08/11/14 1230  vancomycin (VANCOCIN) 2,000 mg in sodium chloride 0.9 % 500 mL IVPB     2,000 mg 250 mL/hr over 120 Minutes Intravenous  Once 08/11/14 1219 08/11/14 1539   08/10/14 1715  cefTRIAXone (ROCEPHIN) 1 g in dextrose 5 % 50 mL IVPB  Status:  Discontinued     1 g 100 mL/hr over 30 Minutes Intravenous Every 24 hours 08/10/14 1708 08/10/14 1718   08/10/14 1715  azithromycin (ZITHROMAX) 500 mg in dextrose 5 % 250 mL IVPB  Status:  Discontinued     500 mg 250 mL/hr over 60 Minutes Intravenous Every 24 hours 08/10/14 1708 08/10/14 1717   08/10/14 1615  cefTRIAXone (ROCEPHIN) 1 g in dextrose 5 % 50 mL IVPB     1 g 100 mL/hr over 30 Minutes Intravenous  Once 08/10/14 1613 08/10/14 1957   08/10/14 1615  azithromycin (ZITHROMAX) 500 mg in dextrose 5 % 250 mL IVPB     500 mg 250 mL/hr over 60 Minutes Intravenous  Once 08/10/14 1613 08/10/14 1957      Assessment: 68 YOM admitted 08/10/2014  to the ED with complains of SOB, L sided CP as well as fevers over the past week.   Infectious Disease: L sided pneumonia with effusion on x-ray. Switched from azith/ceftriaxone to Zosyn/Vanc for HCAP due to recent hospitalization. VATS 6/16 WBC elevated but trend significantly down post VATS, Afeb, per CC to possibly DC vanc over weekend.  Trough due Sunday if not stopped.    6/15 Body fluid>> 6/15 BCx x2>>  6/15 Vanc> 6/17 zosyn >>  Goal of Therapy:  Vancomycin trough level 15-20 mcg/ml  Plan:  Zosyn 3.375g IV q8h infuse over 4h Vancomycin 750 mg IV q24h Follow up SCr, UOP, cultures, clinical course and adjust as clinically indicated. Thank you for allowing pharmacy to be a part of this patients care team.  Lovenia Kim Pharm.D., BCPS, AQ-Cardiology Clinical Pharmacist 08/13/2014 11:08  AM Pager: [phone] Phone: [phone]

## 2014-08-13 NOTE — Progress Notes (Signed)
Utilization review completed.  

## 2014-08-13 NOTE — Progress Notes (Signed)
1 Day Post-Op Procedure(s) (LRB): VIDEO ASSISTED THORACOSCOPY (VATS)/THOROCOTOMY, DRAINAGE OF EMPYEMA (Left) Subjective:  Feels stressed. Uncomfortable  He went into a-fib/SVT this am with rate 160's. 5 mg lopressor IV did not do much. He is allergic to iodine so will avoid amio. Started on cardizem and HR now 92 in sinus during bolus.   Objective: Vital signs in last 24 hours: Temp:  [98.3 F (36.8 C)-99.5 F (37.5 C)] 98.5 F (36.9 C) (06/17 0400) Pulse Rate:  [83-105] 92 (06/17 0700) Cardiac Rhythm:  [-] Normal sinus rhythm (06/17 0400) Resp:  [12-29] 17 (06/17 0700) BP: (105-166)/(48-102) 160/71 mmHg (06/17 0700) SpO2:  [93 %-100 %] 100 % (06/17 0700) Arterial Line BP: (132-204)/(47-63) 196/58 mmHg (06/17 0700)  Hemodynamic parameters for last 24 hours:    Intake/Output from previous day: 06/16 0701 - 06/17 0700 In: 3297.5 [P.O.:600; I.V.:1500; Blood:335; IV Piggyback:862.5] Out: 3625 [Urine:1725; Blood:1000; Chest Tube:900] Intake/Output this shift:    General appearance: alert and cooperative Neurologic: intact Heart: regular rate and rhythm, S1, S2 normal, no murmur, click, rub or gallop Lungs: diminished breath sounds LLL and LUL Extremities: extremities normal, atraumatic, no cyanosis or edema Wound: dressing dry Chest tube output low, serosanguinous Lab Results:  Recent Labs  08/11/14 1505 08/12/14 1221 08/13/14 0400  WBC 36.4*  --  22.2*  HGB 10.8* 9.5* 8.7*  HCT 31.6* 28.0* 25.6*  PLT 328  --  339   BMET:  Recent Labs  08/11/14 1505 08/12/14 1221 08/13/14 0400  NA 129* 131* 135  K 4.5 4.8 4.0  CL 98*  --  102  CO2 23  --  26  GLUCOSE 274*  --  99  BUN 14  --  15  CREATININE 1.03  --  0.83  CALCIUM 9.1  --  8.4*    PT/INR: No results for input(s): LABPROT, INR in the last 72 hours. ABG    Component Value Date/Time   PHART 7.410 08/13/2014 0422   HCO3 25.1* 08/13/2014 0422   TCO2 26 08/13/2014 0422   ACIDBASEDEF 4.0* 08/12/2014 1614    O2SAT 95.0 08/13/2014 0422   CBG (last 3)   Recent Labs  08/12/14 2013 08/12/14 2345 08/13/14 0445  GLUCAP 356* 162* 100*   CXR: stable haziness left chest due to the inflammatory pleural process and pleural thickening from edema. No significant effusion.   Operative cultures negative so far Gram stain showed WBC but no organisms  Assessment/Plan: S/P Procedure(s) (LRB): VIDEO ASSISTED THORACOSCOPY (VATS)/THOROCOTOMY, DRAINAGE OF EMPYEMA (Left)   He is doing well overall. Fever resolved and WBC ct is decreasing. Continue vanc and Zosyn pending cultures.  Postop SVT: cardizem drip for now and switch to po later   Continue CT's to suction  OOB, IS  DM: glucose has come down nicely. Will decreased Levemir to 10 bid to prevent hypoglycemia and continue SSI. Preop Hgb A1c was 9.5 on Metformin.  He has bipolar disorder and chronic pain med addiction due to cervical disc disease. He is taking Oxycontin 60 mg daily at home and trying to decrease it. Continue Oxy IR for pain and add Toradol.   LOS: 3 days    Alleen Borne 08/13/2014

## 2014-08-14 ENCOUNTER — Inpatient Hospital Stay (HOSPITAL_COMMUNITY): Payer: Medicare PPO

## 2014-08-14 LAB — CBC
HCT: 24.7 % — ABNORMAL LOW (ref 39.0–52.0)
HEMOGLOBIN: 8.3 g/dL — AB (ref 13.0–17.0)
MCH: 28.5 pg (ref 26.0–34.0)
MCHC: 33.6 g/dL (ref 30.0–36.0)
MCV: 84.9 fL (ref 78.0–100.0)
Platelets: 355 10*3/uL (ref 150–400)
RBC: 2.91 MIL/uL — ABNORMAL LOW (ref 4.22–5.81)
RDW: 13.6 % (ref 11.5–15.5)
WBC: 16.9 10*3/uL — ABNORMAL HIGH (ref 4.0–10.5)

## 2014-08-14 LAB — GLUCOSE, CAPILLARY
GLUCOSE-CAPILLARY: 118 mg/dL — AB (ref 65–99)
GLUCOSE-CAPILLARY: 101 mg/dL — AB (ref 65–99)
GLUCOSE-CAPILLARY: 179 mg/dL — AB (ref 65–99)
GLUCOSE-CAPILLARY: 128 mg/dL — AB (ref 65–99)
GLUCOSE-CAPILLARY: 101 mg/dL — AB (ref 65–99)

## 2014-08-14 LAB — BASIC METABOLIC PANEL
ANION GAP: 8 (ref 5–15)
BUN: 18 mg/dL (ref 6–20)
CHLORIDE: 101 mmol/L (ref 101–111)
CO2: 27 mmol/L (ref 22–32)
Calcium: 8.3 mg/dL — ABNORMAL LOW (ref 8.9–10.3)
Creatinine, Ser: 0.86 mg/dL (ref 0.61–1.24)
GFR calc Af Amer: >60 mL/min (ref >60)
Glucose, Bld: 122 mg/dL — ABNORMAL HIGH (ref 65–99)
POTASSIUM: 3.8 mmol/L (ref 3.5–5.1)
Sodium: 136 mmol/L (ref 135–145)

## 2014-08-14 LAB — CLOSTRIDIUM DIFFICILE BY PCR: Toxigenic C. Difficile by PCR: NEGATIVE

## 2014-08-14 MED ORDER — ENOXAPARIN SODIUM 40 MG/0.4ML ~~LOC~~ SOLN
40.0000 mg | SUBCUTANEOUS | Status: DC
  Administered 2014-08-14 – 2014-08-17 (×4): 40 mg via SUBCUTANEOUS
  Filled 2014-08-14 (×5): qty 0.4

## 2014-08-14 MED ORDER — INSULIN ASPART 100 UNIT/ML ~~LOC~~ SOLN: 0.0000 [IU] | Freq: Every day | SUBCUTANEOUS | Status: DC

## 2014-08-14 MED ORDER — INSULIN ASPART 100 UNIT/ML ~~LOC~~ SOLN
0.0000 [IU] | Freq: Three times a day (TID) | SUBCUTANEOUS | Status: DC
  Administered 2014-08-14: 2 [IU] via SUBCUTANEOUS
  Administered 2014-08-14: 4 [IU] via SUBCUTANEOUS
  Administered 2014-08-15 – 2014-08-18 (×3): 3 [IU] via SUBCUTANEOUS

## 2014-08-14 MED ORDER — SORBITOL 70 % PO SOLN
60.0000 mL | Freq: Once | ORAL | Status: DC
  Filled 2014-08-14: qty 60

## 2014-08-14 MED ORDER — FUROSEMIDE 40 MG PO TABS
40.0000 mg | ORAL_TABLET | Freq: Every day | ORAL | Status: DC
  Administered 2014-08-14 – 2014-08-18 (×5): 40 mg via ORAL
  Filled 2014-08-14 (×5): qty 1

## 2014-08-14 MED ORDER — FE FUMARATE-B12-VIT C-FA-IFC PO CAPS
1.0000 | ORAL_CAPSULE | Freq: Three times a day (TID) | ORAL | Status: DC
  Administered 2014-08-14 – 2014-08-17 (×12): 1 via ORAL
  Filled 2014-08-14 (×16): qty 1

## 2014-08-14 NOTE — Progress Notes (Signed)
2 Days Post-Op Procedure(s) (LRB): VIDEO ASSISTED THORACOSCOPY (VATS)/THOROCOTOMY, DRAINAGE OF EMPYEMA (Left) Subjective:  OR cultures nrg CXR stable CT drainage 350 cc tx to stepdown  Objective: Vital signs in last 24 hours: Temp:  [97.6 F (36.4 C)-98.9 F (37.2 C)] 97.9 F (36.6 C) (06/18 0700) Pulse Rate:  [69-92] 88 (06/18 0900) Cardiac Rhythm:  [-] Normal sinus rhythm (06/18 0800) Resp:  [13-30] 23 (06/18 0900) BP: (90-165)/(48-92) 159/63 mmHg (06/18 0900) SpO2:  [92 %-99 %] 95 % (06/18 0900)  Hemodynamic parameters for last 24 hours:  nsr  Intake/Output from previous day: 06/17 0701 - 06/18 0700 In: 2176.3 [P.O.:1560; I.V.:166.3; IV Piggyback:450] Out: 1695 [Urine:1375; Chest Tube:320] Intake/Output this shift: Total I/O In: 130 [P.O.:120; I.V.:10] Out: 225 [Urine:145; Chest Tube:80]  Incision dry  Lab Results:  Recent Labs  08/13/14 0400 08/14/14 0500  WBC 22.2* 16.9*  HGB 8.7* 8.3*  HCT 25.6* 24.7*  PLT 339 355   BMET:  Recent Labs  08/13/14 0400 08/14/14 0500  NA 135 136  K 4.0 3.8  CL 102 101  CO2 26 27  GLUCOSE 99 122*  BUN 15 18  CREATININE 0.83 0.86  CALCIUM 8.4* 8.3*    PT/INR: No results for input(s): LABPROT, INR in the last 72 hours. ABG    Component Value Date/Time   PHART 7.410 08/13/2014 0422   HCO3 25.1* 08/13/2014 0422   TCO2 26 08/13/2014 0422   ACIDBASEDEF 4.0* 08/12/2014 1614   O2SAT 95.0 08/13/2014 0422   CBG (last 3)   Recent Labs  08/13/14 2350 08/14/14 0348 08/14/14 0824  GLUCAP 110* 118* 101*    Assessment/Plan: S/P Procedure(s) (LRB): VIDEO ASSISTED THORACOSCOPY (VATS)/THOROCOTOMY, DRAINAGE OF EMPYEMA (Left) Transfer  Cont vanc, Zosyn   LOS: 4 days    Dwayne Vargas 08/14/2014

## 2014-08-15 ENCOUNTER — Inpatient Hospital Stay (HOSPITAL_COMMUNITY): Payer: Medicare PPO

## 2014-08-15 LAB — GLUCOSE, CAPILLARY
GLUCOSE-CAPILLARY: 132 mg/dL — AB (ref 65–99)
GLUCOSE-CAPILLARY: 76 mg/dL (ref 65–99)
Glucose-Capillary: 122 mg/dL — ABNORMAL HIGH (ref 65–99)
Glucose-Capillary: 94 mg/dL (ref 65–99)

## 2014-08-15 MED ORDER — ALUM & MAG HYDROXIDE-SIMETH 200-200-20 MG/5ML PO SUSP
30.0000 mL | ORAL | Status: DC | PRN
  Administered 2014-08-15 – 2014-08-16 (×4): 30 mL via ORAL
  Filled 2014-08-15 (×4): qty 30

## 2014-08-15 MED ORDER — SODIUM CHLORIDE 0.9 % IV SOLN: INTRAVENOUS | Status: DC

## 2014-08-15 MED ORDER — METOPROLOL TARTRATE 25 MG PO TABS
25.0000 mg | ORAL_TABLET | Freq: Two times a day (BID) | ORAL | Status: DC
  Administered 2014-08-15 – 2014-08-18 (×7): 25 mg via ORAL
  Filled 2014-08-15 (×8): qty 1

## 2014-08-15 MED ORDER — SIMETHICONE 80 MG PO CHEW
80.0000 mg | CHEWABLE_TABLET | Freq: Four times a day (QID) | ORAL | Status: DC | PRN
  Administered 2014-08-15: 80 mg via ORAL
  Filled 2014-08-15 (×3): qty 1

## 2014-08-15 MED ORDER — PANTOPRAZOLE SODIUM 40 MG PO TBEC
40.0000 mg | DELAYED_RELEASE_TABLET | Freq: Every day | ORAL | Status: DC
  Administered 2014-08-15 – 2014-08-18 (×4): 40 mg via ORAL
  Filled 2014-08-15 (×3): qty 1

## 2014-08-15 MED ORDER — CYCLOBENZAPRINE HCL 5 MG PO TABS: 7.5000 mg | ORAL_TABLET | Freq: Three times a day (TID) | ORAL | Status: DC

## 2014-08-15 MED ORDER — FENTANYL 75 MCG/HR TD PT72
100.0000 ug | MEDICATED_PATCH | TRANSDERMAL | Status: DC
  Administered 2014-08-15: 100 ug via TRANSDERMAL
  Filled 2014-08-15 (×2): qty 1

## 2014-08-15 MED ORDER — CYCLOBENZAPRINE HCL 5 MG PO TABS
7.5000 mg | ORAL_TABLET | Freq: Every day | ORAL | Status: DC
Start: 2014-08-15 — End: 2014-08-18
  Administered 2014-08-15 – 2014-08-17 (×3): 7.5 mg via ORAL
  Filled 2014-08-15 (×4): qty 1.5

## 2014-08-15 MED ORDER — LOSARTAN POTASSIUM 25 MG PO TABS
25.0000 mg | ORAL_TABLET | Freq: Every day | ORAL | Status: DC
  Administered 2014-08-15 – 2014-08-18 (×4): 25 mg via ORAL
  Filled 2014-08-15 (×4): qty 1

## 2014-08-15 MED ORDER — LEVALBUTEROL HCL 0.63 MG/3ML IN NEBU: 0.6300 mg | INHALATION_SOLUTION | Freq: Four times a day (QID) | RESPIRATORY_TRACT | Status: DC | PRN

## 2014-08-15 NOTE — Progress Notes (Signed)
ANTIBIOTIC CONSULT NOTE - FOLLOW UP  Pharmacy Consult for Zosyn Indication: Left lung empyema - s/p VATs  Allergies  Allergen Reactions  . Iodine Hives and Swelling  . Morphine And Related     Tremors     Patient Measurements: Height:  (180.3 cm) Weight: 226 lb 13.7 oz (102.9 kg) IBW/kg (Calculated) : 75.3  Vital Signs: Temp: 98.1 F (36.7 C) (06/19 0815) Temp Source: Oral (06/19 0815) BP: 164/77 mmHg (06/19 1000) Pulse Rate: 93 (06/19 1100) Intake/Output from previous day: 06/18 0701 - 06/19 0700 In: 705 [P.O.:240; I.V.:15; IV Piggyback:450] Out: 503 [Urine:266; Stool:7; Chest Tube:230] Intake/Output from this shift: Total I/O In: 320 [P.O.:300; I.V.:20] Out: 272 [Urine:201; Stool:1; Chest Tube:70]  Labs:  Recent Labs  08/12/14 1221 08/13/14 0400 08/14/14 0500  WBC  --  22.2* 16.9*  HGB 9.5* 8.7* 8.3*  PLT  --  339 355  CREATININE  --  0.83 0.86   Estimated Creatinine Clearance: 99 mL/min (by C-G formula based on Cr of 0.86). No results for input(s): VANCOTROUGH, VANCOPEAK, VANCORANDOM, GENTTROUGH, GENTPEAK, GENTRANDOM, TOBRATROUGH, TOBRAPEAK, TOBRARND, AMIKACINPEAK, AMIKACINTROU, AMIKACIN in the last 72 hours.   Microbiology: Recent Results (from the past 720 hour(s))  Blood culture (routine x 2)     Status: None (Preliminary result)   Collection Time: 08/10/14  2:04 PM  Result Value Ref Range Status   Specimen Description BLOOD RIGHT HAND  Final   Special Requests BOTTLES DRAWN AEROBIC AND ANAEROBIC  5 CC EA  Final   Culture   Final           BLOOD CULTURE RECEIVED NO GROWTH TO DATE CULTURE WILL BE HELD FOR 5 DAYS BEFORE ISSUING A FINAL NEGATIVE REPORT Performed at [facility]    Report Status PENDING  Incomplete  Blood culture (routine x 2)     Status: None (Preliminary result)   Collection Time: 08/10/14  2:05 PM  Result Value Ref Range Status   Specimen Description BLOOD LEFT ANTECUBITAL  Final   Special Requests BOTTLES DRAWN  AEROBIC AND ANAEROBIC 5 CC EA  Final   Culture   Final           BLOOD CULTURE RECEIVED NO GROWTH TO DATE CULTURE WILL BE HELD FOR 5 DAYS BEFORE ISSUING A FINAL NEGATIVE REPORT Performed at [facility]    Report Status PENDING  Incomplete  MRSA PCR Screening     Status: None   Collection Time: 08/10/14 10:09 PM  Result Value Ref Range Status   MRSA by PCR NEGATIVE NEGATIVE Final    Comment:        The GeneXpert MRSA Assay (FDA approved for NASAL specimens only), is one component of a comprehensive MRSA colonization surveillance program. It is not intended to diagnose MRSA infection nor to guide or monitor treatment for MRSA infections.   Culture, body fluid-bottle     Status: None (Preliminary result)   Collection Time: 08/11/14 11:25 AM  Result Value Ref Range Status   Specimen Description FLUID LEFT PLEURAL  Final   Special Requests NONE  Final   Culture NO GROWTH 3 DAYS  Final   Report Status PENDING  Incomplete  Gram stain     Status: None   Collection Time: 08/11/14 11:25 AM  Result Value Ref Range Status   Specimen Description FLUID LEFT PLEURAL  Final   Special Requests NONE  Final   Gram Stain   Final    ABUNDANT WBC PRESENT, PREDOMINANTLY PMN NO  ORGANISMS SEEN    Report Status 08/11/2014 FINAL  Final  Culture, Urine     Status: None   Collection Time: 08/11/14 12:15 PM  Result Value Ref Range Status   Specimen Description URINE, RANDOM  Final   Special Requests Normal  Final   Culture NO GROWTH 1 DAY  Final   Report Status 08/12/2014 FINAL  Final  Culture, body fluid-bottle     Status: None (Preliminary result)   Collection Time: 08/12/14 11:12 AM  Result Value Ref Range Status   Specimen Description FLUID LEFT PLEURAL  Final   Special Requests NONE  Final   Culture NO GROWTH 2 DAYS  Final   Report Status PENDING  Incomplete  Gram stain     Status: None   Collection Time: 08/12/14 11:12 AM  Result Value Ref Range Status   Specimen Description  FLUID LEFT PLEURAL  Final   Special Requests NONE  Final   Gram Stain   Final    CYTOSPIN SMEAR WBC PRESENT, PREDOMINANTLY PMN NO ORGANISMS SEEN    Report Status 08/12/2014 FINAL  Final  Tissue culture     Status: None (Preliminary result)   Collection Time: 08/12/14 11:25 AM  Result Value Ref Range Status   Specimen Description TISSUE  Final   Special Requests LEFT PLEURAL PEEL SPEC B  Final   Gram Stain   Final    FEW WBC PRESENT,BOTH PMN AND MONONUCLEAR NO ORGANISMS SEEN Performed at [facility]    Culture   Final    FEW MICROAEROPHILIC STREPTOCOCCI Note: Standardized susceptibility testing for this organism is not available. Performed at [facility]    Report Status PENDING  Incomplete  Clostridium Difficile by PCR (not at [facility])     Status: None   Collection Time: 08/14/14  6:08 PM  Result Value Ref Range Status   C difficile by pcr NEGATIVE NEGATIVE Final    Anti-infectives    Start     Dose/Rate Route Frequency Ordered Stop   08/12/14 0100  vancomycin (VANCOCIN) IVPB 750 mg/150 ml premix  Status:  Discontinued     750 mg 150 mL/hr over 60 Minutes Intravenous Every 12 hours 08/11/14 1219 08/15/14 1135   08/11/14 1800  cefTRIAXone (ROCEPHIN) 1 g in dextrose 5 % 50 mL IVPB  Status:  Discontinued     1 g 100 mL/hr over 30 Minutes Intravenous Every 24 hours 08/10/14 1718 08/11/14 1143   08/11/14 1700  azithromycin (ZITHROMAX) 500 mg in dextrose 5 % 250 mL IVPB  Status:  Discontinued     500 mg 250 mL/hr over 60 Minutes Intravenous Every 24 hours 08/10/14 1717 08/11/14 1143   08/11/14 1300  piperacillin-tazobactam (ZOSYN) IVPB 3.375 g     3.375 g 12.5 mL/hr over 240 Minutes Intravenous Every 8 hours 08/11/14 1219     08/11/14 1230  vancomycin (VANCOCIN) 2,000 mg in sodium chloride 0.9 % 500 mL IVPB     2,000 mg 250 mL/hr over 120 Minutes Intravenous  Once 08/11/14 1219 08/11/14 1539   08/10/14 1715  cefTRIAXone (ROCEPHIN) 1 g in dextrose 5 % 50 mL IVPB   Status:  Discontinued     1 g 100 mL/hr over 30 Minutes Intravenous Every 24 hours 08/10/14 1708 08/10/14 1718   08/10/14 1715  azithromycin (ZITHROMAX) 500 mg in dextrose 5 % 250 mL IVPB  Status:  Discontinued     500 mg 250 mL/hr over 60 Minutes Intravenous Every 24 hours 08/10/14 1708 08/10/14  1717   08/10/14 1615  cefTRIAXone (ROCEPHIN) 1 g in dextrose 5 % 50 mL IVPB     1 g 100 mL/hr over 30 Minutes Intravenous  Once 08/10/14 1613 08/10/14 1957   08/10/14 1615  azithromycin (ZITHROMAX) 500 mg in dextrose 5 % 250 mL IVPB     500 mg 250 mL/hr over 60 Minutes Intravenous  Once 08/10/14 1613 08/10/14 1957      Assessment: 68 YOM with L-lung empyema s/p VATS/thorocotomy and drainage of empyema on 6/16. Intra-op tissue cultures are now growing microaerophilic streptococcus - sensitives are still pending. Discussed with Dr. Donata Clay and will discontinue Vancomycin today and continue Zosyn until sensitivities result - at that time we may be able to narrow further.   Goal of Therapy:  Proper antibiotics for infection/cultures adjusted for renal/hepatic function   Plan:  1. D/c Vancomycin 2. Continue Zosyn 3.375g IV every 8 hours (infused over 4 hours) 3. Will continue to follow renal function, culture results, LOT, and antibiotic de-escalation plans   Georgina Pillion, PharmD, BCPS Clinical Pharmacist Pager: [phone] 08/15/2014 11:40 AM

## 2014-08-15 NOTE — Progress Notes (Addendum)
CT surgery PM Rounds  Pain improved with fentanyl patch but now pt c/o indigestion-protonix and mylicon ordered- abd soft with active BS, had BM x 2 today  One chest tube removed Waiting for stepdown bed

## 2014-08-15 NOTE — Plan of Care (Signed)
Problem: Phase I Progression Outcomes Goal: Activity tolerated as ordered Outcome: Completed/Met Date Met:  08/15/14 Pt ambulates in hall  Problem: Phase II Progression Outcomes Goal: Weaning O2 for sats > or equal to 88% Outcome: Completed/Met Date Met:  08/15/14 Pt on ra

## 2014-08-15 NOTE — Progress Notes (Signed)
3 Days Post-Op Procedure(s) (LRB): VIDEO ASSISTED THORACOSCOPY (VATS)/THOROCOTOMY, DRAINAGE OF EMPYEMA (Left) Subjective:  L shoulder pain worse- pt with hx of chronic pain meds- will add fent patch CXR stable, no air leak- DC ant CT OR cultures ++ microaerophilic strep- DC vanc and cont Zosyn Waiting for stepdown Objective: Vital signs in last 24 hours: Temp:  [97.8 F (36.6 C)-98.1 F (36.7 C)] 98.1 F (36.7 C) (06/19 0815) Pulse Rate:  [73-96] 93 (06/19 1100) Cardiac Rhythm:  [-] Normal sinus rhythm (06/19 0737) Resp:  [11-31] 24 (06/19 1100) BP: (126-177)/(64-95) 164/77 mmHg (06/19 1000) SpO2:  [93 %-100 %] 95 % (06/19 1100) Weight:  [226 lb 13.7 oz (102.9 kg)] 226 lb 13.7 oz (102.9 kg) (06/19 0100)  Hemodynamic parameters for last 24 hours:   stable Intake/Output from previous day: 06/18 0701 - 06/19 0700 In: 705 [P.O.:240; I.V.:15; IV Piggyback:450] Out: 503 [Urine:266; Stool:7; Chest Tube:230] Intake/Output this shift: Total I/O In: 320 [P.O.:300; I.V.:20] Out: 272 [Urine:201; Stool:1; Chest Tube:70]  C diff neg No air leak CXR stable  Lab Results:  Recent Labs  08/13/14 0400 08/14/14 0500  WBC 22.2* 16.9*  HGB 8.7* 8.3*  HCT 25.6* 24.7*  PLT 339 355   BMET:  Recent Labs  08/13/14 0400 08/14/14 0500  NA 135 136  K 4.0 3.8  CL 102 101  CO2 26 27  GLUCOSE 99 122*  BUN 15 18  CREATININE 0.83 0.86  CALCIUM 8.4* 8.3*    PT/INR: No results for input(s): LABPROT, INR in the last 72 hours. ABG    Component Value Date/Time   PHART 7.410 08/13/2014 0422   HCO3 25.1* 08/13/2014 0422   TCO2 26 08/13/2014 0422   ACIDBASEDEF 4.0* 08/12/2014 1614   O2SAT 95.0 08/13/2014 0422   CBG (last 3)   Recent Labs  08/14/14 1610 08/14/14 2152 08/15/14 0813  GLUCAP 128* 101* 122*    Assessment/Plan: S/P Procedure(s) (LRB): VIDEO ASSISTED THORACOSCOPY (VATS)/THOROCOTOMY, DRAINAGE OF EMPYEMA (Left) DC vanco and ant chest tube, add fent patch   LOS: 5  days    Kathlee Nations Trigt III 08/15/2014

## 2014-08-16 ENCOUNTER — Inpatient Hospital Stay (HOSPITAL_COMMUNITY): Payer: Medicare PPO

## 2014-08-16 LAB — OTHER BODY FLUID CHEMISTRY

## 2014-08-16 LAB — BASIC METABOLIC PANEL
Anion gap: 8 (ref 5–15)
BUN: 14 mg/dL (ref 6–20)
CO2: 26 mmol/L (ref 22–32)
Calcium: 8.1 mg/dL — ABNORMAL LOW (ref 8.9–10.3)
Chloride: 102 mmol/L (ref 101–111)
Creatinine, Ser: 0.85 mg/dL (ref 0.61–1.24)
GFR calc Af Amer: >60 mL/min (ref >60)
GFR calc non Af Amer: >60 mL/min (ref >60)
Glucose, Bld: 79 mg/dL (ref 65–99)
Potassium: 4.1 mmol/L (ref 3.5–5.1)
Sodium: 136 mmol/L (ref 135–145)

## 2014-08-16 LAB — GLUCOSE, CAPILLARY
GLUCOSE-CAPILLARY: 97 mg/dL (ref 65–99)
Glucose-Capillary: 89 mg/dL (ref 65–99)
Glucose-Capillary: 120 mg/dL — ABNORMAL HIGH (ref 65–99)
Glucose-Capillary: 160 mg/dL — ABNORMAL HIGH (ref 65–99)

## 2014-08-16 LAB — CBC
HCT: 22.7 % — ABNORMAL LOW (ref 39.0–52.0)
Hemoglobin: 7.6 g/dL — ABNORMAL LOW (ref 13.0–17.0)
MCH: 28.7 pg (ref 26.0–34.0)
MCHC: 33.5 g/dL (ref 30.0–36.0)
MCV: 85.7 fL (ref 78.0–100.0)
Platelets: 424 10*3/uL — ABNORMAL HIGH (ref 150–400)
RBC: 2.65 MIL/uL — ABNORMAL LOW (ref 4.22–5.81)
RDW: 13.9 % (ref 11.5–15.5)
WBC: 19.7 10*3/uL — ABNORMAL HIGH (ref 4.0–10.5)

## 2014-08-16 LAB — TISSUE CULTURE

## 2014-08-16 LAB — TYPE AND SCREEN
ABO/RH(D): A NEG
Antibody Screen: NEGATIVE
UNIT DIVISION: 0
Unit division: 0

## 2014-08-16 LAB — CULTURE, BODY FLUID W GRAM STAIN -BOTTLE: Culture: NO GROWTH

## 2014-08-16 LAB — CULTURE, BLOOD (ROUTINE X 2)
CULTURE: NO GROWTH
Culture: NO GROWTH

## 2014-08-16 LAB — CULTURE, BODY FLUID-BOTTLE

## 2014-08-16 MED ORDER — INSULIN DETEMIR 100 UNIT/ML ~~LOC~~ SOLN
10.0000 [IU] | Freq: Two times a day (BID) | SUBCUTANEOUS | Status: DC
  Administered 2014-08-16 (×2): 10 [IU] via SUBCUTANEOUS
  Filled 2014-08-16 (×3): qty 0.1

## 2014-08-16 MED ORDER — METFORMIN HCL 500 MG PO TABS
500.0000 mg | ORAL_TABLET | Freq: Two times a day (BID) | ORAL | Status: DC
  Administered 2014-08-16 – 2014-08-18 (×5): 500 mg via ORAL
  Filled 2014-08-16 (×8): qty 1

## 2014-08-16 MED ORDER — FERROUS GLUCONATE 324 (38 FE) MG PO TABS
324.0000 mg | ORAL_TABLET | Freq: Two times a day (BID) | ORAL | Status: DC
  Administered 2014-08-16 – 2014-08-18 (×5): 324 mg via ORAL
  Filled 2014-08-16 (×8): qty 1

## 2014-08-16 NOTE — Progress Notes (Signed)
4 Days Post-Op Procedure(s) (LRB): VIDEO ASSISTED THORACOSCOPY (VATS)/THOROCOTOMY, DRAINAGE OF EMPYEMA (Left) Subjective:  No complaints. Slept well  Objective: Vital signs in last 24 hours: Temp:  [97.7 F (36.5 C)-98.5 F (36.9 C)] 97.7 F (36.5 C) (06/20 0812) Pulse Rate:  [66-96] 90 (06/20 0700) Cardiac Rhythm:  [-] Normal sinus rhythm (06/20 0800) Resp:  [11-26] 21 (06/20 0700) BP: (131-186)/(60-129) 176/83 mmHg (06/20 0700) SpO2:  [95 %-100 %] 97 % (06/20 0700) Weight:  [102.1 kg (225 lb 1.4 oz)] 102.1 kg (225 lb 1.4 oz) (06/20 0300)  Hemodynamic parameters for last 24 hours:    Intake/Output from previous day: 06/19 0701 - 06/20 0700 In: 1110 [P.O.:900; I.V.:60; IV Piggyback:150] Out: 1494 [Urine:1352; Stool:2; Chest Tube:140] Intake/Output this shift: Total I/O In: -  Out: 10 [Chest Tube:10]  General appearance: alert and cooperative Heart: regular rate and rhythm, S1, S2 normal, no murmur, click, rub or gallop Lungs: diminished breath sounds LLL chest tube output low  Lab Results:  Recent Labs  08/14/14 0500 08/16/14 0522  WBC 16.9* 19.7*  HGB 8.3* 7.6*  HCT 24.7* 22.7*  PLT 355 424*   BMET:  Recent Labs  08/14/14 0500 08/16/14 0522  NA 136 136  K 3.8 4.1  CL 101 102  CO2 27 26  GLUCOSE 122* 79  BUN 18 14  CREATININE 0.86 0.85  CALCIUM 8.3* 8.1*    PT/INR: No results for input(s): LABPROT, INR in the last 72 hours. ABG    Component Value Date/Time   PHART 7.410 08/13/2014 0422   HCO3 25.1* 08/13/2014 0422   TCO2 26 08/13/2014 0422   ACIDBASEDEF 4.0* 08/12/2014 1614   O2SAT 95.0 08/13/2014 0422   CBG (last 3)   Recent Labs  08/15/14 1658 08/15/14 2140 08/16/14 0809  GLUCAP 94 76 89    Assessment/Plan: S/P Procedure(s) (LRB): VIDEO ASSISTED THORACOSCOPY (VATS)/THOROCOTOMY, DRAINAGE OF EMPYEMA (Left)  He continues to improve. Will remove remaining chest tube and transfer to 2W. Continue Zosyn for now and switch to Augmentin at  discharge.  Mobilize and work on IS.  Acute blood loss anemia: Hgb down further from 2 days ago. Will repeat in am. Iron started. Diabetes: glucose under good control. Resume Metformin and decrease Levemir.   LOS: 6 days    Alleen Borne 08/16/2014

## 2014-08-16 NOTE — Progress Notes (Signed)
Utilization review completed.  

## 2014-08-16 NOTE — Progress Notes (Signed)
08/16/2014  1030 Received pt to room 2w26, a transfer from 2S.  Pt is A&O, no c/o voiced. Tele monitor applied. Oriented to room, call light and bed.  Call bell in reach, family at bedside.   Kathryne Hitch

## 2014-08-17 ENCOUNTER — Inpatient Hospital Stay (HOSPITAL_COMMUNITY): Payer: Medicare PPO

## 2014-08-17 LAB — CULTURE, BODY FLUID W GRAM STAIN -BOTTLE: Culture: NO GROWTH

## 2014-08-17 LAB — CULTURE, BODY FLUID-BOTTLE

## 2014-08-17 LAB — GLUCOSE, CAPILLARY
GLUCOSE-CAPILLARY: 83 mg/dL (ref 65–99)
GLUCOSE-CAPILLARY: 114 mg/dL — AB (ref 65–99)
Glucose-Capillary: 65 mg/dL (ref 65–99)
Glucose-Capillary: 103 mg/dL — ABNORMAL HIGH (ref 65–99)
Glucose-Capillary: 198 mg/dL — ABNORMAL HIGH (ref 65–99)

## 2014-08-17 LAB — CBC
HEMATOCRIT: 24.5 % — AB (ref 39.0–52.0)
HEMOGLOBIN: 8 g/dL — AB (ref 13.0–17.0)
MCH: 28 pg (ref 26.0–34.0)
MCHC: 32.7 g/dL (ref 30.0–36.0)
MCV: 85.7 fL (ref 78.0–100.0)
Platelets: 511 10*3/uL — ABNORMAL HIGH (ref 150–400)
RBC: 2.86 MIL/uL — ABNORMAL LOW (ref 4.22–5.81)
RDW: 14 % (ref 11.5–15.5)
WBC: 18.7 10*3/uL — AB (ref 4.0–10.5)

## 2014-08-17 MED ORDER — AMOXICILLIN-POT CLAVULANATE 875-125 MG PO TABS
1.0000 | ORAL_TABLET | Freq: Two times a day (BID) | ORAL | Status: DC
  Administered 2014-08-17 – 2014-08-18 (×2): 1 via ORAL
  Filled 2014-08-17 (×3): qty 1

## 2014-08-17 NOTE — Discharge Summary (Signed)
Physician Discharge Summary       [address] [place]       [name] [address]             [phone]    Patient ID: Dwayne Vargas MRN: [id number] DOB/AGE: 10-03-1945 69 y.o.  Admit date: 08/10/2014 Discharge date: 08/18/2014  Admission Diagnoses: 1. Left empyema 2. Left loculated pleural effusion 3. CAP (community acquired pneumonia) 4. History of hypertension 5. History of bipolar disorder 6. History of DM  Discharge Diagnoses:  1. Left empyema 2. Left loculated pleural effusion 3. CAP (community acquired pneumonia) 4. History of hypertension 5. History of bipolar disorder 6. History of DM  Procedure (s):  1. Left thoracotomy (muscle sparing) 2. Drainage of empyema  3. Decortication of the left lung by Dr. Laneta Simmers on 08/12/2014.  History of Presenting Illness: The patient is a 69 year old diabetic with a long history of smoking until a few months ago who had neck surgery at [facility] in May 2016. He was at home doing well until last Thursday when he began having left sided chest pain, cough, shortness of breath and fever. He felt bad over the weekend and came to the ER yesterday where he had a temp of 101.4 and WBC ct of 27K. CT scan of the chest showed a large loculated left pleural effusion and left lung pneumonia. He had a thoracentesis by IR and only 100 cc of turbid fluid could be removed. WBC ct in the pleural fluid was 21308, 91% neutrophils, with no organisms seen on gram stain. His peripheral WBC ct today was further elevated to 36.4.  He has a large loculated left pleural effusion and left lung consolidation with fever and leukocytosis consistent with pneumonia and empyema. This will require left thoracoscopy and probably thoracotomy for complete drainage and decortication of the left lung. I discussed the surgery with the patient and his daughter including alternatives, benefits and risks including but not limited to bleeding, infection, prolonged air leak, and  respiratory failure. He understands and agrees to proceed. He underwent a muscle sparing left thoracotomy, drainage of empyema, and decortication on 08/12/2014.   Brief Hospital Course:  He has remained afebrile and hemodynamically stable. His a line and foley were removed early in his post operative course. He was put on Zosyn post op. Chest tube output gradually decreased. There was no air leak. Daily chest x rays remained stable. All chest tubes were removed by 06/20. WBC has been steadily decreasing. It is now down to 18,700. Cultures from left pleural peel showed few microaerophilic streptococci. Zosyn has been stopped and he has been placed on oral Augmentin.His wound is clean and dry. There is no sign of infection. He does have some sanguinous drainage from his chest tube sites. He is on room air. He is tolerating a diet and has had a bowel movement. He is felt surgically stable for discharge today.    Latest Vital Signs: Blood pressure 176/53, pulse 84, temperature 99.6 F (37.6 C), temperature source Oral, resp. rate 18, height  (1.803 m), weight 225 lb 1.4 oz (102.1 kg), SpO2 98 %.  Physical Exam: Cardiovascular: RRR Pulmonary: Clear to auscultation on right and diminished at left base; no rales, wheezes, or rhonchi. Abdomen: Soft, non tender, bowel sounds present. Extremities: Trace lower extremity edema. Wounds: Bloody like drainage from chest tube sites. Dressing change. Left chest wound is clean and dry. No signs of infection  Discharge Condition:Stable and discharged to home.  Recent  laboratory studies:  Lab Results  Component Value Date   WBC 18.7* 08/17/2014   HGB 8.0* 08/17/2014   HCT 24.5* 08/17/2014   MCV 85.7 08/17/2014   PLT 511* 08/17/2014   Lab Results  Component Value Date   NA 136 08/16/2014   K 4.1 08/16/2014   CL 102 08/16/2014   CO2 26 08/16/2014   CREATININE 0.85 08/16/2014   GLUCOSE 79 08/16/2014      Diagnostic Studies: Ct Abdomen Pelvis  Wo Contrast  08/10/2014   CLINICAL DATA:  69 year old male with chest pain cough and left upper quadrant pain. Initial encounter.  EXAM: CT ABDOMEN AND PELVIS WITHOUT CONTRAST  TECHNIQUE: Multidetector CT imaging of the abdomen and pelvis was performed following the standard protocol without IV contrast.  COMPARISON:  Chest radiographs today reported separately.  FINDINGS: Partially visible left lower lobe and lingula consolidation with air bronchograms and superimposed pleural effusion. The effusion extends laterally around the compressed lung parenchyma. No pericardial effusion. No definite cavitary changes.  Mild curvilinear opacity in the right medial lung base, azygoesophageal recess. No right pleural effusion.  No acute or suspicious osseous lesion identified.  No pelvic free fluid. Mildly distended bladder. Negative distal colon.  Redundant sigmoid colon mostly containing gas. Retained stool in the left colon and transverse colon. Retained stool at the hepatic flexure and ascending colon. Negative appendix and terminal ileum. No dilated small bowel. Oral contrast has not yet reached the distal small bowel. Negative stomach and duodenum.  Surgically absent gallbladder. Non contrast liver, spleen, pancreas and adrenal glands are within normal limits. No abdominal free fluid. No hydronephrosis or hydroureter. Punctate left lower pole renal calculus. Mild nonspecific perinephric stranding. Right lower pole exophytic cyst with simple fluid densitometry. Aortoiliac calcified atherosclerosis noted. No lymphadenopathy in the abdomen or pelvis.  IMPRESSION: 1. Abnormal visualized left lower lobe and lingula with a combination of multilobar consolidation, and also compression of the left lung by a complex or loculated pleural effusion. This might all be related to pneumonia, but obstructing left hilar lung mass is also a consideration. Chest CT with IV contrast is recommended when possible to further characterize. 2.  No acute or inflammatory findings in the abdomen or pelvis.   Electronically Signed   By: Odessa Fleming M.D.   On: 08/10/2014 16:07   Dg Chest 2 View  08/17/2014   CLINICAL DATA:  Thoracotomy.  EXAM: CHEST  2 VIEW  COMPARISON:  08/16/2014 .  FINDINGS: Interim removal of left chest tube. Right IJ line stable position. Mediastinum and hilar structures are stable. Heart size stable. Diffuse left lung infiltrate with left base atelectasis. Small left pleural effusion. Fluid in the major fissure cannot be excluded . No pneumothorax. Prior cervical spine fusion.  IMPRESSION: 1. Interim removal of left chest tube. Pneumothorax. Right IJ line in stable position. 2. Small left pleural effusion. Small mild pleural fluid may be present in the left major fissure. 3. Persistent diffuse left lung infiltrate with left base atelectasis.   Electronically Signed   By: Maisie Fus  Register   On: 08/17/2014 08:21    Ct Head Wo Contrast  08/12/2014   CLINICAL DATA:  Altered mental status for 1 day  EXAM: CT HEAD WITHOUT CONTRAST  TECHNIQUE: Contiguous axial images were obtained from the base of the skull through the vertex without intravenous contrast.  COMPARISON:  None.  FINDINGS: There is age related volume loss. There is no intracranial mass hemorrhage, extra-axial fluid collection, or midline shift. There is  slight small vessel disease in the centra semiovale bilaterally. Elsewhere gray-white compartments appear normal. There is no demonstrable acute infarct. The bony calvarium appears intact. The mastoid air cells are clear. There is rightward deviation of the nasal septum.  IMPRESSION: Age related volume loss with rather minimal periventricular small vessel disease. No intracranial mass, hemorrhage, or evidence suggesting acute infarct.   Electronically Signed   By: Bretta Bang III M.D.   On: 08/12/2014 09:13   Ct Chest Wo Contrast  08/10/2014   CLINICAL DATA:  Cough for 2 weeks. Abnormal chest x-ray findings. Leukocytosis   EXAM: CT CHEST WITHOUT CONTRAST  TECHNIQUE: Multidetector CT imaging of the chest was performed following the standard protocol without IV contrast.  COMPARISON:  08/10/2014 abdominal CT  FINDINGS: THORACIC INLET/BODY WALL:  No acute abnormality.  MEDIASTINUM:  Normal heart size. No pericardial effusion. Atherosclerosis, including the coronary arteries. Mild enlargement of mediastinal lymph nodes, presumably reactive. No acute vascular findings.  LUNG WINDOWS:  There is extensive opacification and volume loss in the left lower lobe and lingula. Large left pleural effusion with scalloped margins. Although nodular in appearance, thickening along the upper mediastinum pleural surface appears low-density and is likely loculated fluid. Interlobular septal thickening in the left upper lobe which has a smooth appearance and is likely from compression. Patent central airways.  Subpleural density along the medial right lower lobe is along osteophytes and most consistent with scarring. Calcified granuloma in the right lower lobe.  UPPER ABDOMEN:  No acute findings.  OSSEOUS:  No acute fracture.  No suspicious lytic or blastic lesions.  IMPRESSION: Left chest disease is consistent with pneumonia and large complex effusion/empyema. An underlying lung lesion could easily be obscured on this noncontrast examination, and pleural fluid sampling and imaging follow-up is recommended.   Electronically Signed   By: Marnee Spring M.D.   On: 08/10/2014 16:47   US Thoracentesis Asp Pleural Space W/img Guide  08/11/2014   INDICATION: Symptomatic L sided pleural effusion  EXAM: US THORACENTESIS ASP PLEURAL SPACE W/IMG GUIDE  COMPARISON:  None.  MEDICATIONS: 10 cc 1% lidocaine  COMPLICATIONS: None immediate  TECHNIQUE: Informed written consent was obtained from the patient after a discussion of the risks, benefits and alternatives to treatment. A timeout was performed prior to the initiation of the procedure.  Initial ultrasound  scanning demonstrates a left pleural effusion. The lower chest was prepped and draped in the usual sterile fashion. 1% lidocaine was used for local anesthesia.  Under direct ultrasound guidance, a 19 gauge, 7-cm, Yueh catheter was introduced. An ultrasound image was saved for documentation purposes. The thoracentesis was performed. The catheter was removed and a dressing was applied. The patient tolerated the procedure well without immediate post procedural complication. The patient was escorted to have an upright chest radiograph.  FINDINGS: A total of approximately 100 cc of brown, cloudy fluid was removed. Requested samples were sent to the laboratory.  IMPRESSION: Successful ultrasound-guided L sided thoracentesis yielding 100 cc of pleural fluid.  Read by:  Robet Leu [facility]   Electronically Signed   By: Corlis Leak M.D.   On: 08/11/2014 11:53   Discharge Medications:   Medication List    TAKE these medications        amoxicillin-clavulanate 875-125 MG per tablet  Commonly known as:  AUGMENTIN  Take 1 tablet by mouth every 12 (twelve) hours.     fentaNYL 100 MCG/HR  Commonly known as:  DURAGESIC - dosed mcg/hr  Place 1  patch (100 mcg total) onto the skin every 3 (three) days.     ferrous gluconate 324 MG tablet  Commonly known as:  FERGON  Take 1 tablet (324 mg total) by mouth daily with breakfast.     gabapentin 300 MG capsule  Commonly known as:  NEURONTIN  Take 300-600 mg by mouth 2 (two) times daily. 300 mg every morning and 600 mg every night     lamoTRIgine 200 MG tablet  Commonly known as:  LAMICTAL  Take 200 mg by mouth at bedtime.     losartan 25 MG tablet  Commonly known as:  COZAAR  Take 25 mg by mouth daily.     lovastatin 10 MG tablet  Commonly known as:  MEVACOR  Take 10 mg by mouth daily.     metFORMIN 500 MG tablet  Commonly known as:  GLUCOPHAGE  Take 500 mg by mouth 2 (two) times daily.     metoprolol tartrate 25 MG tablet  Commonly known as:   LOPRESSOR  Take 1 tablet (25 mg total) by mouth 2 (two) times daily.     OXYCONTIN 30 MG T12a  Generic drug:  OxyCODONE HCl ER  Take 30 mg by mouth every 12 (twelve) hours as needed (pain).     PROAIR HFA 108 (90 BASE) MCG/ACT inhaler  Generic drug:  albuterol  Inhale 2 puffs into the lungs 4 (four) times daily as needed for wheezing or shortness of breath.     tiZANidine 2 MG tablet  Commonly known as:  ZANAFLEX  Take 2-4 mg by mouth 3 (three) times daily as needed for muscle spasms.     venlafaxine XR 150 MG 24 hr capsule  Commonly known as:  EFFEXOR-XR  Take 300 mg by mouth daily.        Follow Up Appointments: Follow-up Information    Follow up with Alleen Borne, MD On 09/15/2014.   Specialty:  Cardiothoracic Surgery   Why:  PA/LAT CXR to be taken at [facility] which is in the same building as Dr. Sharee Pimple office) on 09/15/2014 at 9:15 am;Appointment time is at 10:00 am   Contact information:   [address] [employer] [address] [place] [address] [phone]       Follow up with Irving Copas, MD.   Specialty:  Family Medicine   Why:  Call for follow up appointment regarding further surveillance of HGA1C 9.5 and management of diabetes   Contact information:   [address] [place] [address] [phone]       Follow up with Nurse On 08/27/2014.   Why:  Appointment is with nurse only to have left chest staples and sutures removed. Appointment time is 9:30 am   Contact information:   [address] [employer] [address] [place] [address] [phone]      Signed: Doree Fudge MPA-C 08/18/2014, 7:54 AM

## 2014-08-17 NOTE — Progress Notes (Signed)
08/17/2014 1815 Central line D/C'd per order and protocol. Pt tolerated well.   Instructed pt on bedrest for 30 minutes.  Kathryne Hitch

## 2014-08-17 NOTE — Care Management Note (Addendum)
Case Management Note Donn Pierini RN, BSN Unit 2W-Case Manager [phone]  Patient Details  Name: Dwayne Vargas MRN: [id number] Date of Birth: [birth year]  Subjective/Objective:      Pt admitted s/p VATS             Action/Plan: PTA pt lived at home alone- per pt. He has a sister nearby that can assist post discharge. - NCM to follow for any further d/c needs  Expected Discharge Date:   (unknown)               Expected Discharge Plan:  Home/Self Care  In-House Referral:     Discharge planning Services  CM Consult  Post Acute Care Choice:    Choice offered to:     DME Arranged:    DME Agency:     HH Arranged:    HH Agency:     Status of Service:  In process, will continue to follow  Medicare Important Message Given:  Yes Date Medicare IM Given:  08/17/14 Medicare IM give by:  Donn Pierini RN, BSN  Date Additional Medicare IM Given:    Additional Medicare Important Message give by:     If discussed at Long Length of Stay Meetings, dates discussed:  08/17/14  Additional Comments:  Darrold Span, RN 08/17/2014, 10:15 AM

## 2014-08-17 NOTE — Discharge Instructions (Signed)
ACTIVITY: ° °1.Increase activity slowly. °2.Walk daily and increase frequency and duration as tolerates. °3.May walk up steps. °4.No lifting more than ten pounds for two weeks. °5.No driving for two weeks. °6.Avoid straining. °7.STOP any activity that causes chest pain, shortness of breath, dizziness,sweating,  °   or excessive weakness. °8.Continue with breathing exercises daily. ° °DIET:  Diabetic diet and Low fat, Low salt  diet ° ° °WOUND: ° °1.May shower. °2.Clean wounds with mild soap and water.  Call the office at [phone] if any problems arise. °Thoracotomy, Care After °Refer to this sheet in the next few weeks. These instructions provide you with information on caring for yourself after your procedure. Your health care provider may also give you more specific instructions. Your treatment has been planned according to current medical practices, but problems sometimes occur. Call your health care provider if you have any problems or questions after your procedure. °WHAT TO EXPECT AFTER YOUR PROCEDURE °After your procedure, it is typical to have the following sensations: °· You may feel pain at the incision site. °· You may be constipated from the pain medicine given and the change in your level of activity. °· You may feel extremely tired. °HOME CARE INSTRUCTIONS °· Take over-the-counter or prescription medicines for pain, discomfort, or fever only as directed by your health care provider. It is very important to take pain relieving medicine before your pain becomes severe. You will be able to breathe and cough more comfortably if your pain is well controlled. °· Take deep breaths. Deep breathing helps to keep your lungs inflated and protects against a lung infection (pneumonia). °· Cough frequently. Even though coughing may cause discomfort, coughing is important to clear mucus (phlegm) and expand your lungs. Coughing helps prevent pneumonia. If it hurts to cough, hold a pillow against your chest when  you cough. This may help with the discomfort. °· Continue to use an incentive spirometer as directed. The use of an incentive spirometer helps to keep your lungs inflated and protects against pneumonia. °· Change the bandages over your incision as needed or as directed by your health care provider. °· Remove the bandages over your chest tube site as directed by your health care provider. °· Resume your normal diet as directed. It is important to have adequate protein, calories, vitamins, and minerals to promote healing. °· Prevent constipation. °¨ Eat high-fiber foods such as whole grain cereals and breads, brown rice, beans, and fresh fruits and vegetables. °¨ Drink enough water and fluids to keep your urine clear or pale yellow. Avoid drinking beverages containing caffeine. Beverages containing caffeine can cause dehydration and harden your stool. °¨ Talk to your health care provider about taking a stool softener or laxative. °· Avoid lifting until you are instructed otherwise. °· Do not drive until directed by your health care provider.  Do not drive while taking pain medicines (narcotics). °· Do not bathe, swim, or use a hot tub until directed by your health care provider. You may shower instead. Gently wash the area of your incision with water and soap as directed. Do not use anything else to clean your incision except as directed by your health care provider. °· Do not use any tobacco products including cigarettes, chewing tobacco, or electronic cigarettes. °· Avoid secondhand smoke. °· Schedule an appointment for stitch (suture) or staple removal as directed. °· Schedule and attend all follow-up visits as directed by your health care provider. It is important to keep all your appointments. °· Participate   in pulmonary rehabilitation as directed by your health care provider. °· Do not travel by airplane for 2 weeks after your chest tube is removed. °SEEK MEDICAL CARE IF: °· You are bleeding from your  wounds. °· Your heartbeat seems irregular. °· You have redness, swelling, or increasing pain in the wounds. °· There is pus coming from your wounds. °· There is a bad smell coming from the wound or dressing. °· You have a fever or chills. °· You have nausea or are vomiting. °· You have muscle aches. °SEEK IMMEDIATE MEDICAL CARE IF: °· You have a rash. °· You have difficulty breathing. °· You have a reaction or side effect to medicines given. °· You have persistent nausea. °· You have lightheadedness or feel faint. °· You have shortness of breath or chest pain. °· You have persistent pain. °Document Released: 07/28/2010 Document Revised: 02/17/2013 Document Reviewed: 10/01/2012 °ExitCare® Patient Information ©2015 [employer]. This information is not intended to replace advice given to you by your health care provider. Make sure you discuss any questions you have with your health care provider. ° °

## 2014-08-17 NOTE — Progress Notes (Signed)
Patient blood sugar dropped to 65 this morning.Patient asymptomatic.Patient given one packet of graham crackers with peanut butter and a 4 ounces of cola.Blood sugar rechecked up to 83.Dr.Hendrickson notified no new orders received.

## 2014-08-17 NOTE — Progress Notes (Addendum)
      [address]       [name] [address]             [phone]       5 Days Post-Op Procedure(s) (LRB): VIDEO ASSISTED THORACOSCOPY (VATS)/THOROCOTOMY, DRAINAGE OF EMPYEMA (Left)  Subjective: Patient without complaints.  Objective: Vital signs in last 24 hours: Temp:  [97.7 F (36.5 C)-98.3 F (36.8 C)] 98.1 F (36.7 C) (06/21 0359) Pulse Rate:  [80] 80 (06/20 1030) Cardiac Rhythm:  [-] Normal sinus rhythm (06/20 1908) Resp:  [16-21] 18 (06/21 0359) BP: (143-162)/(55-74) 143/55 mmHg (06/21 0359) SpO2:  [95 %-98 %] 97 % (06/21 0359)     Intake/Output from previous day: 06/20 0701 - 06/21 0700 In: 600 [P.O.:600] Out: 20 [Chest Tube:20]   Physical Exam:  Cardiovascular: RRR Pulmonary: Clear to auscultation on right and diminished at left base; no rales, wheezes, or rhonchi. Abdomen: Soft, non tender, bowel sounds present. Extremities: Trace lower extremity edema. Wounds: Bloody like drainage from chest tube sites. Dressing change. Left chest wound is clean and dry. No signs of infection   Lab Results: CBC: Recent Labs  08/16/14 0522 08/17/14 0104  WBC 19.7* 18.7*  HGB 7.6* 8.0*  HCT 22.7* 24.5*  PLT 424* 511*   BMET:  Recent Labs  08/16/14 0522  NA 136  K 4.1  CL 102  CO2 26  GLUCOSE 79  BUN 14  CREATININE 0.85  CALCIUM 8.1*    PT/INR: No results for input(s): LABPROT, INR in the last 72 hours. ABG:  INR: Will add last result for INR, ABG once components are confirmed Will add last 4 CBG results once components are confirmed  Assessment/Plan:  1. CV - SR in the 80's. On Lopressor 25 mg bid and Cozaar 25 mg daily. 2.  Pulmonary - On room air. CXR ordered. 3. ABL anemia-H and H stable at 8 and 24.5. Continue Fergon. 4. DM-CBGs 160/65/83.Pre op HGA1C 9.5. On Metformin 500 mg bid. Stop scheduled Insulin as has had hypoglycemia.Will need follow up with medical doctor after discharge. 5. ID-On Zosyn. Cultures show few Micro  Streptococci. Will ask Dr. Laneta Simmers if can transition to oral antibiotics 6. Hopefully, home in am  ZIMMERMAN,DONIELLE MPA-C 08/17/2014,7:23 AM  Chart reviewed, patient examined, agree with above. CXR continues to improve. I think he can go home tomorrow on oral antibiotic, probably cephalosporin

## 2014-08-18 LAB — GLUCOSE, CAPILLARY: Glucose-Capillary: 137 mg/dL — ABNORMAL HIGH (ref 65–99)

## 2014-08-18 MED ORDER — METOPROLOL TARTRATE 25 MG PO TABS: 25.0000 mg | ORAL_TABLET | Freq: Two times a day (BID) | ORAL | Status: DC

## 2014-08-18 MED ORDER — OXYCONTIN 30 MG PO T12A: 30.0000 mg | EXTENDED_RELEASE_TABLET | Freq: Two times a day (BID) | ORAL | Status: DC | PRN

## 2014-08-18 MED ORDER — AMOXICILLIN-POT CLAVULANATE 875-125 MG PO TABS: 1.0000 | ORAL_TABLET | Freq: Two times a day (BID) | ORAL | Status: DC

## 2014-08-18 MED ORDER — FERROUS GLUCONATE 324 (38 FE) MG PO TABS: 324.0000 mg | ORAL_TABLET | Freq: Every day | ORAL | Status: DC

## 2014-08-18 MED ORDER — FENTANYL 100 MCG/HR TD PT72: 100.0000 ug | MEDICATED_PATCH | TRANSDERMAL | Status: DC

## 2014-08-18 NOTE — Progress Notes (Signed)
  Pharmacy Discharge Medication Therapy Review   Total Number of meds on admission ____10_____ (polypharmacy > 10 meds)  Indications for all medications: [x]  Yes       []  No  Adherence Review  []  Excellent (no doses missed/week)     []  Good (no more than 1 dose missed/week)     []  Partial (2-3 doses missed/week)     []  Poor (>3 doses missed/week)     [x]  Not assessed  Total number of high risk medications __2__ (Anticoagulants, Dual antiplatelets, oral Antihyperglycemic agents,Insulins, Antipsychotics, Anti-Seizure meds, Inhalers, HF/ACS meds, Antibiotics and HIV medications)   Assessment: (Medication related problems)  Intervention  YES NO  Explanation   Indications      Medication without noted indication []  [x]     Indication without noted medication []  [x]     Duplicate therapy []  [x]    Efficacy      Suboptimal drug or dose selection [x]  []  Patient was on Oxycontin 60 mg PO Q12H PTA before surgery.  Patient has been instructed via PA to take Oxycontin 30 mg or less every 12 hours PRN once patch was done.  Of note, oxycontin should not used as as needed basis.  Please consider evaluating the need to continue this medication at follow up.    Insufficient dose/duration []  [x]    Failure to receive therapy  (Rx not filled) []  [x]     Safety      Adverse drug event []  [x]     Drug interaction []  [x]     Excessive dose/duration []  [x]    High-risk medications []  [x]    Compliance     Underuse []  [x]     Overuse []  [x]    Other pertinent pharmacist counseling []  [x]      Total number of new medications upon discharge: __________  Time:  Time spent preparing for discharge counseling: 0 min. Time spent counseling patient: 0 min. Additional time spent on discharge (specify): 10 min.   PLAN:  Consider the following at discharge/Recommendations  - Revaluate pain level at follow up, consider D/C oxycontin or have scheduled at lowest possible dose.    Thank you!  Red Christians, Pharm.  D. Clinical Pharmacy Resident Pager: [phone] Ph: [phone] 08/18/2014 9:32 AM

## 2014-08-18 NOTE — Progress Notes (Addendum)
      [address]       Dwayne Vargas [address]             [phone]       6 Days Post-Op Procedure(s) (LRB): VIDEO ASSISTED THORACOSCOPY (VATS)/THOROCOTOMY, DRAINAGE OF EMPYEMA (Left)  Subjective: Patient without complaints, wants to go home.  Objective: Vital signs in last 24 hours: Temp:  [99.2 F (37.3 C)-99.6 F (37.6 C)] 99.6 F (37.6 C) (06/22 0533) Pulse Rate:  [83-98] 84 (06/22 0533) Cardiac Rhythm:  [-] Sinus tachycardia (06/21 2130) Resp:  [18] 18 (06/22 0533) BP: (155-176)/(53-69) 176/53 mmHg (06/22 0533) SpO2:  [95 %-98 %] 98 % (06/22 0533)     Intake/Output from previous day: 06/21 0701 - 06/22 0700 In: 720 [P.O.:720] Out: -    Physical Exam:  Cardiovascular: RRR Pulmonary: Clear to auscultation on right and diminished at left base; no rales, wheezes, or rhonchi. Abdomen: Soft, non tender, bowel sounds present. Extremities: Trace lower extremity edema. Wounds: Bloody like drainage from chest tube sites. Dressing change. Left chest wound is clean and dry. No signs of infection   Lab Results: CBC:  Recent Labs  08/16/14 0522 08/17/14 0104  WBC 19.7* 18.7*  HGB 7.6* 8.0*  HCT 22.7* 24.5*  PLT 424* 511*   BMET:   Recent Labs  08/16/14 0522  NA 136  K 4.1  CL 102  CO2 26  GLUCOSE 79  BUN 14  CREATININE 0.85  CALCIUM 8.1*    PT/INR: No results for input(s): LABPROT, INR in the last 72 hours. ABG:  INR: Will add last result for INR, ABG once components are confirmed Will add last 4 CBG results once components are confirmed  Assessment/Plan:  1. CV - SR in the 80's. On Lopressor 25 mg bid and Cozaar 25 mg daily. 2.  Pulmonary - On room air.  3. ABL anemia-H and H stable at 8 and 24.5. Continue Fergon. 4. DM-CBGs 114/198/137.Pre op HGA1C 9.5. On Metformin 500 mg bid. Stop scheduled Insulin as has had hypoglycemia.Will need follow up with medical doctor after discharge. 5. ID-On Zosyn. Cultures show few Micro  Streptococci. Zosyn stopped yesterday and started on oral Augmentin 6. Patient requesting prescription for Fentanyl patch-will give 5 with no refills. He was instructed to take Oxy (as taken prior to surgery at 30 mg or less every 12 hours PRN once patch was done). 7. Discharge  Dwayne Vargas MPA-C 08/18/2014,7:36 AM

## 2014-08-25 ENCOUNTER — Ambulatory Visit (INDEPENDENT_AMBULATORY_CARE_PROVIDER_SITE_OTHER): Payer: Self-pay | Admitting: Physician Assistant

## 2014-08-25 ENCOUNTER — Other Ambulatory Visit: Payer: Self-pay | Admitting: *Deleted

## 2014-08-25 ENCOUNTER — Telehealth: Payer: Self-pay | Admitting: *Deleted

## 2014-08-25 ENCOUNTER — Ambulatory Visit
Admission: RE | Admit: 2014-08-25 | Discharge: 2014-08-25 | Disposition: A | Discharge status: Home / Self Care | Payer: Medicare PPO | Source: Ambulatory Visit | Attending: Cardiothoracic Surgery | Admitting: Cardiothoracic Surgery

## 2014-08-25 VITALS — BP 119/80 | HR 88 | Temp 98.1°F | Resp 16 | Ht 71.0 in | Wt 206.0 lb

## 2014-08-25 DIAGNOSIS — J869 Pyothorax without fistula: Secondary | ICD-10-CM

## 2014-08-25 DIAGNOSIS — R042 Hemoptysis: Secondary | ICD-10-CM

## 2014-08-25 MED ORDER — AMOXICILLIN-POT CLAVULANATE 875-125 MG PO TABS: 1.0000 | ORAL_TABLET | Freq: Two times a day (BID) | ORAL | Status: DC

## 2014-08-25 MED ORDER — PREDNISONE 10 MG (21) PO TBPK: ORAL_TABLET | ORAL | Status: DC

## 2014-08-25 NOTE — Telephone Encounter (Signed)
erro  neous encounter

## 2014-08-25 NOTE — Progress Notes (Signed)
[address]       [name] [address]             [phone]          HPI: Mr. Dwayne Vargas underwent a left thoracotomy/drainage of empyema by Dr. Laneta Simmers for a microaerophilic strep empyema on 08/12/2014.  His postoperative course was uneventful and he was discharged home on po Augmentin on 6/20.  He presented to the office today complaining of persistent malaise.  He states that he has not improved since his discharge home. He has had a slight cough, but had not really looked at his sputum until yesterday, when he noted that he was "coughing up blood".  He states that the sputum was completely bloody, not blood tinged, but only a small amount, and was bright red.  He finished his course of antibiotics yesterday.  Appetite remains poor.  He denies fever, chills, dyspnea or chest pain.    Current Outpatient Prescriptions  Medication Sig Dispense Refill  . ferrous gluconate (FERGON) 324 MG tablet Take 1 tablet (324 mg total) by mouth daily with breakfast.  3  . gabapentin (NEURONTIN) 300 MG capsule Take 300-600 mg by mouth 2 (two) times daily. 300 mg every morning and 600 mg every night  5  . lamoTRIgine (LAMICTAL) 200 MG tablet Take 200 mg by mouth at bedtime.  3  . losartan (COZAAR) 25 MG tablet Take 25 mg by mouth daily.    Marland Kitchen lovastatin (MEVACOR) 10 MG tablet Take 10 mg by mouth daily.    . metFORMIN (GLUCOPHAGE) 500 MG tablet Take 500 mg by mouth 2 (two) times daily.    . metoprolol tartrate (LOPRESSOR) 25 MG tablet Take 1 tablet (25 mg total) by mouth 2 (two) times daily. 60 tablet 1  . OXYCONTIN 30 MG T12A Take 30 mg by mouth every 12 (twelve) hours as needed (pain). (Patient taking differently: Take 30 mg by mouth daily. ) 60 each 0  . PROAIR HFA 108 (90 BASE) MCG/ACT inhaler Inhale 2 puffs into the lungs 4 (four) times daily as needed for wheezing or shortness of breath.   0  . tiZANidine (ZANAFLEX) 2 MG tablet Take 2-4 mg by mouth 3 (three) times daily as needed  for muscle spasms.   1  . venlafaxine XR (EFFEXOR-XR) 150 MG 24 hr capsule Take 300 mg by mouth daily.  3   No current facility-administered medications for this visit.     Physical Exam: BP 119/80 HR 88 Resp 16 Wounds: Incision well healed.  Staples removed without difficulty. Chest tube sutures removed, anterior CT site with small amount serous drainage. No erythema or purulence. Heart: regular rate and rhythm Lungs: Decreased BS L base    Diagnostic Tests: Chest xray: Dg Chest 2 View  08/25/2014   CLINICAL DATA:  One-day history of hemoptysis; history of left empyema drainage on June 16th ; former smoker.  EXAM: CHEST  2 VIEW  COMPARISON:  Chest x-ray of August 17, 2014  FINDINGS: The right lung is adequately inflated and clear. On the left there is persistent increased density obscuring the lateral aspect of the hemidiaphragm and the costophrenic gutter. A small amount fluid or pleural thickening persists over the mid and lower lateral thoracic wall. The interstitial markings remain mildly increased in the left mid and lower lung in part due to superimposition of residual empyema loculated along the posterior left chest wall. The heart and pulmonary vascularity are normal. The  mediastinum is normal in width. Surgical clips are present over the left lower lateral chest wall.  IMPRESSION: There has not been significant interval change in the appearance of the chest for the past 8 days. There are persistent findings in the left hemithorax consistent with residual empyema, basilar atelectasis, and/or pleural thickening.  Chest CT scanning is recommended if the patient's clinical condition is worsening.   Electronically Signed   By: Devyon  Swazil[place] M.D.   On: 08/25/2014 12:47       Assessment/Plan: Mr. Dwayne BogusHoyle presents today with generalized malaise and bloody sputum following a left thoracotomy/drainage of empyema.  He actually expectorated some bloody sputum while in the office today, and  although it appeared to be dark, it was a large amount. I reviewed his chest x-ray with Dr. Donata ClayVan Trigt, who was in the office today, and he agrees that it actually appears improved since discharge. There does not appear to be an acute process, and it sounds like this could be inflammatory in nature.  After discussion with Dr. Donata ClayVan Trigt, we will continue Augmentin an additional week, and give him a 5 day Medrol dosepack.  I have instructed him to call in the next week if his symptoms do not improve or worsen, otherwise, we will see him back at his scheduled appointment in 2 week.

## 2014-09-13 ENCOUNTER — Other Ambulatory Visit: Payer: Self-pay | Admitting: Surgery

## 2014-09-13 DIAGNOSIS — J869 Pyothorax without fistula: Secondary | ICD-10-CM

## 2014-09-15 ENCOUNTER — Ambulatory Visit (INDEPENDENT_AMBULATORY_CARE_PROVIDER_SITE_OTHER): Payer: Self-pay | Admitting: Surgery

## 2014-09-15 VITALS — BP 139/68 | HR 80 | Resp 20 | Ht 71.0 in | Wt 205.0 lb

## 2014-09-15 DIAGNOSIS — Z9889 Other specified postprocedural states: Secondary | ICD-10-CM

## 2014-09-15 DIAGNOSIS — G8918 Other acute postprocedural pain: Secondary | ICD-10-CM

## 2014-09-15 DIAGNOSIS — J869 Pyothorax without fistula: Secondary | ICD-10-CM

## 2014-09-15 MED ORDER — OXYCONTIN 30 MG PO T12A: 30.0000 mg | EXTENDED_RELEASE_TABLET | Freq: Every day | ORAL | Status: DC

## 2014-09-17 ENCOUNTER — Encounter: Payer: Self-pay | Admitting: Surgery

## 2014-09-17 NOTE — Progress Notes (Signed)
    HPI: Patient returns for routine postoperative follow-up having undergone left thoracotomy with drainage of an empyema and decortication of the left lung on 08/12/2014. The patient's early postoperative recovery while in the hospital was notable for an uncomplicated postop course. Since hospital discharge the patient reports that he has been slowly progressing. He has been walking daily but still feels somewhat weak overall. He denies any fever or chills, cough or shortness of breath. He still has some left chest wall pain. He is on chronic Oxycontin for cervical spine disease pain and had surgery in April.    Current Outpatient Prescriptions  Medication Sig Dispense Refill  . gabapentin (NEURONTIN) 300 MG capsule Take 300-600 mg by mouth 2 (two) times daily. 300 mg every morning and 600 mg every night  5  . lamoTRIgine (LAMICTAL) 200 MG tablet Take 200 mg by mouth at bedtime.  3  . losartan (COZAAR) 25 MG tablet Take 25 mg by mouth daily.    Marland Kitchen lovastatin (MEVACOR) 10 MG tablet Take 10 mg by mouth daily.    . metFORMIN (GLUCOPHAGE) 500 MG tablet Take 500 mg by mouth 2 (two) times daily.    . metoprolol tartrate (LOPRESSOR) 25 MG tablet Take 1 tablet (25 mg total) by mouth 2 (two) times daily. 60 tablet 1  . OXYCONTIN 30 MG T12A Take 30 mg by mouth daily. 30 each 0  . tiZANidine (ZANAFLEX) 2 MG tablet Take 2-4 mg by mouth 3 (three) times daily as needed for muscle spasms.   1  . venlafaxine XR (EFFEXOR-XR) 150 MG 24 hr capsule Take 300 mg by mouth daily.  3   No current facility-administered medications for this visit.    Physical Exam: BP 139/68 mmHg  Pulse 80  Resp 20  Ht  (1.803 m)  Wt 205 lb (92.987 kg)  BMI 28.60 kg/m2  SpO2 97% He looks well Lungs are clear The left chest incision is healing well  Diagnostic Tests:  None today  Impression:  He is slowly improving following drainage of a left empyema. I encouraged him to continue walking and to use his incentive  spirometer. I renewed his Oxycontin today but told him that any further prescriptions for this would have to come from his primary physician.  Plan:  He will follow up with Dr. Abigail Miyamoto and will return to see me if he has any problems with incision.   Alleen Borne, MD [facility] [phone]

## 2014-12-09 ENCOUNTER — Other Ambulatory Visit (HOSPITAL_COMMUNITY): Payer: Self-pay | Admitting: Psychiatry

## 2015-05-17 ENCOUNTER — Other Ambulatory Visit: Payer: Self-pay | Admitting: Urology

## 2015-05-26 ENCOUNTER — Inpatient Hospital Stay (HOSPITAL_COMMUNITY): Admission: RE | Admit: 2015-05-26 | Payer: Medicare PPO | Source: Ambulatory Visit

## 2015-05-27 ENCOUNTER — Other Ambulatory Visit (HOSPITAL_COMMUNITY): Payer: Self-pay | Admitting: Urology

## 2015-05-27 NOTE — Progress Notes (Signed)
Chest x ray , ekg 6/16 epic

## 2015-05-30 ENCOUNTER — Inpatient Hospital Stay (HOSPITAL_COMMUNITY)
Admission: RE | Admit: 2015-05-30 | Discharge: 2015-05-30 | Disposition: A | Discharge status: Home / Self Care | Payer: Medicare PPO | Source: Ambulatory Visit

## 2015-05-31 ENCOUNTER — Encounter (HOSPITAL_COMMUNITY): Admission: RE | Payer: Self-pay | Source: Ambulatory Visit

## 2015-05-31 ENCOUNTER — Ambulatory Visit (HOSPITAL_COMMUNITY): Admission: RE | Admit: 2015-05-31 | Payer: Medicare PPO | Source: Ambulatory Visit | Admitting: Urology

## 2015-05-31 SURGERY — TRANSURETHRAL RESECTION OF THE PROSTATE WITH GYRUS INSTRUMENTS
Anesthesia: General

## 2015-06-06 ENCOUNTER — Other Ambulatory Visit: Payer: Self-pay | Admitting: Urology

## 2015-07-05 NOTE — Patient Instructions (Signed)
Dwayne LarsenDavid Vargas  07/05/2015   Your procedure is scheduled on: 07-22-15  [facility]  [facility] 3rd [facility].  Call this number if you have problems the morning of surgery [phone]   Remember: ONLY 1 PERSON MAY GO WITH YOU TO SHORT STAY TO GET  READY MORNING OF YOUR SURGERY.  Do not eat food or drink liquids :After Midnight.     Take these medicines the morning of surgery with A SIP OF WATER: metoprolol tartrate, venlafaxine (effexor) DO NOT TAKE ANY DIABETIC MEDICATIONS DAY OF YOUR SURGERY                               You may not have any metal on your body including hair pins and              piercings  Do not wear jewelry, make-up, lotions, powders or perfumes, deodorant             Do not wear nail polish.  Do not shave  48 hours prior to surgery.              Men may shave face and neck.   Do not bring valuables to the hospital. [facility] IS NOT             RESPONSIBLE   FOR VALUABLES.  Contacts, dentures or bridgework may not be worn into surgery.  Leave suitcase in the car. After surgery it may be brought to your room.     Patients discharged the day of surgery will not be allowed to drive home.  Name and phone number of your driver:  Special Instructions: N/A              Please read over the following fact sheets you were given: _____________________________________________________________________             [facility] - Pre[facility] Before surgery, you can play an important role.  Because skin is not sterile, your skin needs to be as free of germs as possible.  You can reduce the number of germs on your skin by washing with CHG (chlorahexidine gluconate) soap before surgery.  CHG is an antiseptic cleaner which kills germs and bonds with the skin to continue killing germs even after washing. Please DO NOT use if you have an allergy to CHG or antibacterial soaps.  If  your skin becomes reddened/irritated stop using the CHG and inform your nurse when you arrive at Short Stay. Do not shave (including legs and underarms) for at least 48 hours prior to the first CHG shower.  You may shave your face/neck. Please follow these instructions carefully:  1.  Shower with CHG Soap the night before surgery and the  morning of Surgery.  2.  If you choose to wash your hair, wash your hair first as usual with your  normal  shampoo.  3.  After you shampoo, rinse your hair and body thoroughly to remove the  shampoo.                           4.  Use CHG as you would any other liquid soap.  You can apply chg directly  to the skin and wash  Gently with a scrungie or clean washcloth.  5.  Apply the CHG Soap to your body ONLY FROM THE NECK DOWN.   Do not use on face/ open                           Wound or open sores. Avoid contact with eyes, ears mouth and genitals (private parts).                       Wash face,  Genitals (private parts) with your normal soap.             6.  Wash thoroughly, paying special attention to the area where your surgery  will be performed.  7.  Thoroughly rinse your body with warm water from the neck down.  8.  DO NOT shower/wash with your normal soap after using and rinsing off  the CHG Soap.                9.  Pat yourself dry with a clean towel.            10.  Wear clean pajamas.            11.  Place clean sheets on your bed the night of your first shower and do not  sleep with pets. Day of Surgery : Do not apply any lotions/deodorants the morning of surgery.  Please wear clean clothes to the hospital/surgery center.  FAILURE TO FOLLOW THESE INSTRUCTIONS MAY RESULT IN THE CANCELLATION OF YOUR SURGERY PATIENT SIGNATURE_________________________________  NURSE SIGNATURE__________________________________  ________________________________________________________________________

## 2015-07-07 ENCOUNTER — Encounter (HOSPITAL_COMMUNITY): Payer: Self-pay

## 2015-07-07 ENCOUNTER — Encounter (HOSPITAL_COMMUNITY)
Admission: RE | Admit: 2015-07-07 | Discharge: 2015-07-07 | Disposition: A | Discharge status: Home / Self Care | Payer: Medicare PPO | Source: Ambulatory Visit | Attending: Urology | Admitting: Urology

## 2015-07-07 DIAGNOSIS — E119 Type 2 diabetes mellitus without complications: Secondary | ICD-10-CM | POA: Insufficient documentation

## 2015-07-07 DIAGNOSIS — F319 Bipolar disorder, unspecified: Secondary | ICD-10-CM | POA: Insufficient documentation

## 2015-07-07 DIAGNOSIS — N400 Enlarged prostate without lower urinary tract symptoms: Secondary | ICD-10-CM | POA: Insufficient documentation

## 2015-07-07 DIAGNOSIS — I10 Essential (primary) hypertension: Secondary | ICD-10-CM | POA: Diagnosis not present

## 2015-07-07 DIAGNOSIS — Z01812 Encounter for preprocedural laboratory examination: Secondary | ICD-10-CM | POA: Diagnosis present

## 2015-07-07 DIAGNOSIS — F329 Major depressive disorder, single episode, unspecified: Secondary | ICD-10-CM | POA: Insufficient documentation

## 2015-07-07 HISTORY — DX: Unspecified hearing loss, unspecified ear: H91.90

## 2015-07-07 HISTORY — DX: Adverse effect of unspecified anesthetic, initial encounter: T41.45XA

## 2015-07-07 HISTORY — DX: Depression, unspecified: F32.A

## 2015-07-07 HISTORY — DX: Major depressive disorder, single episode, unspecified: F32.9

## 2015-07-07 HISTORY — DX: Other complications of anesthesia, initial encounter: T88.59XA

## 2015-07-07 LAB — BASIC METABOLIC PANEL
ANION GAP: 10 (ref 5–15)
BUN: 10 mg/dL (ref 6–20)
CHLORIDE: 105 mmol/L (ref 101–111)
CO2: 24 mmol/L (ref 22–32)
Calcium: 10.1 mg/dL (ref 8.9–10.3)
Creatinine, Ser: 1.12 mg/dL (ref 0.61–1.24)
GFR calc Af Amer: >60 mL/min (ref >60)
GLUCOSE: 121 mg/dL — AB (ref 65–99)
POTASSIUM: 4.8 mmol/L (ref 3.5–5.1)
Sodium: 139 mmol/L (ref 135–145)

## 2015-07-07 LAB — CBC
HCT: 47.2 % (ref 39.0–52.0)
Hemoglobin: 16.2 g/dL (ref 13.0–17.0)
MCH: 29.8 pg (ref 26.0–34.0)
MCHC: 34.3 g/dL (ref 30.0–36.0)
MCV: 86.8 fL (ref 78.0–100.0)
PLATELETS: 237 10*3/uL (ref 150–400)
RBC: 5.44 MIL/uL (ref 4.22–5.81)
RDW: 13.3 % (ref 11.5–15.5)
WBC: 10.1 10*3/uL (ref 4.0–10.5)

## 2015-07-07 LAB — PROTIME-INR
INR: 1.06 (ref 0.00–1.49)
Prothrombin Time: 14 seconds (ref 11.6–15.2)

## 2015-07-07 LAB — APTT: aPTT: 32 seconds (ref 24–37)

## 2015-07-07 NOTE — Progress Notes (Addendum)
ekg 06-01-15 dr Abigail Miyamotothacker on chart Chest xray 08-25-14 epic

## 2015-07-08 LAB — HEMOGLOBIN A1C
Hgb A1c MFr Bld: 7.2 % — ABNORMAL HIGH (ref 4.8–5.6)
Mean Plasma Glucose: 160 mg/dL

## 2015-07-22 ENCOUNTER — Ambulatory Visit (HOSPITAL_COMMUNITY): Payer: Medicare PPO | Admitting: Certified Registered Nurse Anesthetist

## 2015-07-22 ENCOUNTER — Observation Stay (HOSPITAL_COMMUNITY)
Admission: RE | Admit: 2015-07-22 | Discharge: 2015-07-23 | Disposition: A | Discharge status: Home / Self Care | Payer: Medicare PPO | Source: Ambulatory Visit | Attending: Urology | Admitting: Urology

## 2015-07-22 ENCOUNTER — Encounter (HOSPITAL_COMMUNITY)
Admission: RE | Disposition: A | Discharge status: Home / Self Care | Payer: Self-pay | Source: Ambulatory Visit | Attending: Urology

## 2015-07-22 ENCOUNTER — Encounter (HOSPITAL_COMMUNITY): Payer: Self-pay

## 2015-07-22 DIAGNOSIS — E6609 Other obesity due to excess calories: Secondary | ICD-10-CM | POA: Diagnosis not present

## 2015-07-22 DIAGNOSIS — Z6830 Body mass index (BMI) 30.0-30.9, adult: Secondary | ICD-10-CM | POA: Diagnosis not present

## 2015-07-22 DIAGNOSIS — Z8546 Personal history of malignant neoplasm of prostate: Secondary | ICD-10-CM | POA: Insufficient documentation

## 2015-07-22 DIAGNOSIS — N359 Urethral stricture, unspecified: Secondary | ICD-10-CM | POA: Diagnosis not present

## 2015-07-22 DIAGNOSIS — M1990 Unspecified osteoarthritis, unspecified site: Secondary | ICD-10-CM | POA: Diagnosis not present

## 2015-07-22 DIAGNOSIS — Z7984 Long term (current) use of oral hypoglycemic drugs: Secondary | ICD-10-CM | POA: Diagnosis not present

## 2015-07-22 DIAGNOSIS — E119 Type 2 diabetes mellitus without complications: Secondary | ICD-10-CM | POA: Diagnosis not present

## 2015-07-22 DIAGNOSIS — R351 Nocturia: Secondary | ICD-10-CM | POA: Insufficient documentation

## 2015-07-22 DIAGNOSIS — F419 Anxiety disorder, unspecified: Secondary | ICD-10-CM | POA: Insufficient documentation

## 2015-07-22 DIAGNOSIS — F329 Major depressive disorder, single episode, unspecified: Secondary | ICD-10-CM | POA: Insufficient documentation

## 2015-07-22 DIAGNOSIS — R3916 Straining to void: Secondary | ICD-10-CM | POA: Diagnosis not present

## 2015-07-22 DIAGNOSIS — Z87891 Personal history of nicotine dependence: Secondary | ICD-10-CM | POA: Insufficient documentation

## 2015-07-22 DIAGNOSIS — Z96659 Presence of unspecified artificial knee joint: Secondary | ICD-10-CM | POA: Insufficient documentation

## 2015-07-22 DIAGNOSIS — N138 Other obstructive and reflux uropathy: Secondary | ICD-10-CM | POA: Diagnosis not present

## 2015-07-22 DIAGNOSIS — N401 Enlarged prostate with lower urinary tract symptoms: Principal | ICD-10-CM | POA: Insufficient documentation

## 2015-07-22 DIAGNOSIS — R3912 Poor urinary stream: Secondary | ICD-10-CM | POA: Insufficient documentation

## 2015-07-22 DIAGNOSIS — N529 Male erectile dysfunction, unspecified: Secondary | ICD-10-CM | POA: Insufficient documentation

## 2015-07-22 DIAGNOSIS — Z79899 Other long term (current) drug therapy: Secondary | ICD-10-CM | POA: Diagnosis not present

## 2015-07-22 DIAGNOSIS — I10 Essential (primary) hypertension: Secondary | ICD-10-CM | POA: Diagnosis not present

## 2015-07-22 DIAGNOSIS — R350 Frequency of micturition: Secondary | ICD-10-CM | POA: Insufficient documentation

## 2015-07-22 DIAGNOSIS — R3914 Feeling of incomplete bladder emptying: Secondary | ICD-10-CM | POA: Diagnosis not present

## 2015-07-22 DIAGNOSIS — N400 Enlarged prostate without lower urinary tract symptoms: Secondary | ICD-10-CM

## 2015-07-22 HISTORY — PX: TRANSURETHRAL RESECTION OF PROSTATE: SHX73

## 2015-07-22 LAB — GLUCOSE, CAPILLARY
GLUCOSE-CAPILLARY: 161 mg/dL — AB (ref 65–99)
GLUCOSE-CAPILLARY: 151 mg/dL — AB (ref 65–99)
GLUCOSE-CAPILLARY: 171 mg/dL — AB (ref 65–99)
Glucose-Capillary: 155 mg/dL — ABNORMAL HIGH (ref 65–99)

## 2015-07-22 SURGERY — TRANSURETHRAL RESECTION OF THE PROSTATE WITH GYRUS INSTRUMENTS
Anesthesia: General

## 2015-07-22 MED ORDER — OXYCODONE HCL 5 MG/5ML PO SOLN
5.0000 mg | Freq: Once | ORAL | Status: DC | PRN
  Filled 2015-07-22: qty 5

## 2015-07-22 MED ORDER — FLUTICASONE PROPIONATE 50 MCG/ACT NA SUSP
2.0000 | Freq: Every evening | NASAL | Status: DC | PRN
  Filled 2015-07-22: qty 16

## 2015-07-22 MED ORDER — HYDROMORPHONE HCL 1 MG/ML IJ SOLN
0.5000 mg | INTRAMUSCULAR | Status: DC | PRN
  Administered 2015-07-22: 1 mg via INTRAVENOUS
  Filled 2015-07-22: qty 1

## 2015-07-22 MED ORDER — TEMAZEPAM 15 MG PO CAPS: 15.0000 mg | ORAL_CAPSULE | Freq: Every evening | ORAL | Status: DC | PRN

## 2015-07-22 MED ORDER — PROPOFOL 10 MG/ML IV BOLUS
INTRAVENOUS | Status: AC
  Filled 2015-07-22: qty 20

## 2015-07-22 MED ORDER — ONDANSETRON HCL 4 MG/2ML IJ SOLN
INTRAMUSCULAR | Status: DC | PRN
  Administered 2015-07-22: 4 mg via INTRAVENOUS

## 2015-07-22 MED ORDER — DOCUSATE SODIUM 100 MG PO CAPS
100.0000 mg | ORAL_CAPSULE | Freq: Two times a day (BID) | ORAL | Status: DC
  Administered 2015-07-22 – 2015-07-23 (×2): 100 mg via ORAL
  Filled 2015-07-22 (×2): qty 1

## 2015-07-22 MED ORDER — SODIUM CHLORIDE 0.9 % IV SOLN
INTRAVENOUS | Status: DC
  Administered 2015-07-22 – 2015-07-23 (×2): via INTRAVENOUS

## 2015-07-22 MED ORDER — CEFAZOLIN SODIUM-DEXTROSE 2-4 GM/100ML-% IV SOLN
INTRAVENOUS | Status: AC
  Filled 2015-07-22: qty 100

## 2015-07-22 MED ORDER — FINASTERIDE 5 MG PO TABS
5.0000 mg | ORAL_TABLET | Freq: Every day | ORAL | Status: DC
Start: 2015-07-22 — End: 2015-07-23
  Administered 2015-07-22: 5 mg via ORAL
  Filled 2015-07-22: qty 1

## 2015-07-22 MED ORDER — TRIAMCINOLONE ACETONIDE 55 MCG/ACT NA AERO: 2.0000 | INHALATION_SPRAY | Freq: Every evening | NASAL | Status: DC | PRN

## 2015-07-22 MED ORDER — ACETAMINOPHEN 325 MG PO TABS: 650.0000 mg | ORAL_TABLET | ORAL | Status: DC | PRN

## 2015-07-22 MED ORDER — TAMSULOSIN HCL 0.4 MG PO CAPS
0.4000 mg | ORAL_CAPSULE | Freq: Every day | ORAL | Status: DC
  Administered 2015-07-22: 0.4 mg via ORAL
  Filled 2015-07-22: qty 1

## 2015-07-22 MED ORDER — SODIUM CHLORIDE 0.9 % IR SOLN
3000.0000 mL | Status: DC
  Administered 2015-07-22 (×3): 3000 mL

## 2015-07-22 MED ORDER — HYDROMORPHONE HCL 1 MG/ML IJ SOLN
0.2500 mg | INTRAMUSCULAR | Status: DC | PRN
  Administered 2015-07-22 (×2): 0.5 mg via INTRAVENOUS
  Administered 2015-07-22: 0.25 mg via INTRAVENOUS
  Administered 2015-07-22: 0.5 mg via INTRAVENOUS
  Administered 2015-07-22: 0.25 mg via INTRAVENOUS

## 2015-07-22 MED ORDER — EPHEDRINE SULFATE 50 MG/ML IJ SOLN
INTRAMUSCULAR | Status: AC
  Filled 2015-07-22: qty 1

## 2015-07-22 MED ORDER — FENTANYL CITRATE (PF) 100 MCG/2ML IJ SOLN
INTRAMUSCULAR | Status: AC
  Filled 2015-07-22: qty 2

## 2015-07-22 MED ORDER — HYDROMORPHONE HCL 1 MG/ML IJ SOLN
INTRAMUSCULAR | Status: AC
  Filled 2015-07-22: qty 1

## 2015-07-22 MED ORDER — CEFAZOLIN SODIUM-DEXTROSE 2-4 GM/100ML-% IV SOLN
2.0000 g | INTRAVENOUS | Status: AC
  Administered 2015-07-22: 2 g via INTRAVENOUS
  Filled 2015-07-22: qty 100

## 2015-07-22 MED ORDER — LOSARTAN POTASSIUM 50 MG PO TABS
25.0000 mg | ORAL_TABLET | Freq: Every day | ORAL | Status: DC
  Administered 2015-07-22: 25 mg via ORAL
  Filled 2015-07-22: qty 1

## 2015-07-22 MED ORDER — CEFAZOLIN SODIUM-DEXTROSE 2-4 GM/100ML-% IV SOLN
2.0000 g | Freq: Four times a day (QID) | INTRAVENOUS | Status: AC
  Administered 2015-07-22 – 2015-07-23 (×3): 2 g via INTRAVENOUS
  Filled 2015-07-22 (×3): qty 100

## 2015-07-22 MED ORDER — PRAVASTATIN SODIUM 20 MG PO TABS
10.0000 mg | ORAL_TABLET | Freq: Every day | ORAL | Status: DC
  Administered 2015-07-22: 10 mg via ORAL
  Filled 2015-07-22: qty 1

## 2015-07-22 MED ORDER — METOPROLOL TARTRATE 25 MG PO TABS
25.0000 mg | ORAL_TABLET | Freq: Two times a day (BID) | ORAL | Status: DC
  Administered 2015-07-22 – 2015-07-23 (×2): 25 mg via ORAL
  Filled 2015-07-22 (×2): qty 1

## 2015-07-22 MED ORDER — HYDROCODONE-ACETAMINOPHEN 5-325 MG PO TABS
1.0000 | ORAL_TABLET | ORAL | Status: DC | PRN
  Administered 2015-07-22: 1 via ORAL
  Administered 2015-07-22 – 2015-07-23 (×2): 2 via ORAL
  Filled 2015-07-22: qty 2
  Filled 2015-07-22: qty 1
  Filled 2015-07-22: qty 2

## 2015-07-22 MED ORDER — EPHEDRINE SULFATE 50 MG/ML IJ SOLN
INTRAMUSCULAR | Status: DC | PRN
  Administered 2015-07-22: 5 mg via INTRAVENOUS
  Administered 2015-07-22: 10 mg via INTRAVENOUS
  Administered 2015-07-22 (×3): 5 mg via INTRAVENOUS
  Administered 2015-07-22: 10 mg via INTRAVENOUS

## 2015-07-22 MED ORDER — INSULIN ASPART 100 UNIT/ML ~~LOC~~ SOLN: 0.0000 [IU] | SUBCUTANEOUS | Status: DC

## 2015-07-22 MED ORDER — CALCIUM CARBONATE ANTACID 500 MG PO CHEW
1.0000 | CHEWABLE_TABLET | Freq: Three times a day (TID) | ORAL | Status: DC | PRN
  Administered 2015-07-22: 200 mg via ORAL
  Filled 2015-07-22 (×2): qty 1

## 2015-07-22 MED ORDER — FENTANYL CITRATE (PF) 100 MCG/2ML IJ SOLN
INTRAMUSCULAR | Status: DC | PRN
Start: 2015-07-22 — End: 2015-07-22
  Administered 2015-07-22: 50 ug via INTRAVENOUS
  Administered 2015-07-22 (×2): 25 ug via INTRAVENOUS
  Administered 2015-07-22 (×2): 50 ug via INTRAVENOUS

## 2015-07-22 MED ORDER — VENLAFAXINE HCL ER 150 MG PO CP24
300.0000 mg | ORAL_CAPSULE | Freq: Every morning | ORAL | Status: DC
  Filled 2015-07-22: qty 2

## 2015-07-22 MED ORDER — ONDANSETRON HCL 4 MG/2ML IJ SOLN: 4.0000 mg | INTRAMUSCULAR | Status: DC | PRN

## 2015-07-22 MED ORDER — HYDROCODONE-ACETAMINOPHEN 5-325 MG PO TABS: 1.0000 | ORAL_TABLET | Freq: Four times a day (QID) | ORAL | Status: DC | PRN

## 2015-07-22 MED ORDER — OXYCODONE HCL 5 MG PO TABS: 5.0000 mg | ORAL_TABLET | Freq: Once | ORAL | Status: DC | PRN

## 2015-07-22 MED ORDER — MIRTAZAPINE 15 MG PO TABS
30.0000 mg | ORAL_TABLET | Freq: Every day | ORAL | Status: DC
  Administered 2015-07-22: 30 mg via ORAL
  Filled 2015-07-22: qty 2

## 2015-07-22 MED ORDER — LACTATED RINGERS IV SOLN
INTRAVENOUS | Status: DC
  Administered 2015-07-22 (×2): via INTRAVENOUS

## 2015-07-22 MED ORDER — PROPOFOL 10 MG/ML IV BOLUS
INTRAVENOUS | Status: DC | PRN
  Administered 2015-07-22: 50 mg via INTRAVENOUS
  Administered 2015-07-22: 20 mg via INTRAVENOUS
  Administered 2015-07-22: 150 mg via INTRAVENOUS
  Administered 2015-07-22: 10 mg via INTRAVENOUS
  Administered 2015-07-22: 20 mg via INTRAVENOUS

## 2015-07-22 MED ORDER — LAMOTRIGINE 200 MG PO TABS
200.0000 mg | ORAL_TABLET | Freq: Every day | ORAL | Status: DC
  Administered 2015-07-22: 200 mg via ORAL
  Filled 2015-07-22 (×2): qty 1

## 2015-07-22 MED ORDER — MEPERIDINE HCL 50 MG/ML IJ SOLN: 6.2500 mg | INTRAMUSCULAR | Status: DC | PRN

## 2015-07-22 MED ORDER — HEPARIN SODIUM (PORCINE) 5000 UNIT/ML IJ SOLN
5000.0000 [IU] | Freq: Three times a day (TID) | INTRAMUSCULAR | Status: DC
  Administered 2015-07-22 – 2015-07-23 (×3): 5000 [IU] via SUBCUTANEOUS
  Filled 2015-07-22 (×3): qty 1

## 2015-07-22 MED ORDER — CEFAZOLIN (ANCEF) 1 G IV SOLR: 2.0000 g | Freq: Three times a day (TID) | INTRAVENOUS | Status: DC

## 2015-07-22 MED ORDER — SODIUM CHLORIDE 0.9 % IR SOLN
Status: DC | PRN
  Administered 2015-07-22: 9000 mL

## 2015-07-22 MED ORDER — LIDOCAINE HCL (CARDIAC) 20 MG/ML IV SOLN
INTRAVENOUS | Status: DC | PRN
  Administered 2015-07-22: 50 mg via INTRAVENOUS

## 2015-07-22 SURGICAL SUPPLY — 18 items
BAG URINE DRAINAGE (UROLOGICAL SUPPLIES) ×3 IMPLANT
BAG URO CATCHER STRL LF (MISCELLANEOUS) ×3 IMPLANT
CATH FOLEY 3WAY 30CC 24FR (CATHETERS) ×2
CATH HEMA 3WAY 30CC 22FR COUDE (CATHETERS) IMPLANT
CATH URTH STD 24FR FL 3W 2 (CATHETERS) ×1 IMPLANT
ELECT REM PT RETURN 9FT ADLT (ELECTROSURGICAL) ×3
ELECTRODE REM PT RTRN 9FT ADLT (ELECTROSURGICAL) ×1 IMPLANT
EVACUATOR MICROVAS BLADDER (UROLOGICAL SUPPLIES) ×3 IMPLANT
GLOVE BIOGEL M STRL SZ7.5 (GLOVE) ×3 IMPLANT
GOWN STRL REUS W/TWL LRG LVL3 (GOWN DISPOSABLE) ×3 IMPLANT
GOWN STRL REUS W/TWL XL LVL3 (GOWN DISPOSABLE) ×3 IMPLANT
HOLDER FOLEY CATH W/STRAP (MISCELLANEOUS) IMPLANT
LOOP CUT BIPOLAR 24F LRG (ELECTROSURGICAL) ×3 IMPLANT
MANIFOLD NEPTUNE II (INSTRUMENTS) ×3 IMPLANT
PACK CYSTO (CUSTOM PROCEDURE TRAY) ×3 IMPLANT
SYRINGE IRR TOOMEY STRL 70CC (SYRINGE) IMPLANT
TUBING CONNECTING 10 (TUBING) ×2 IMPLANT
TUBING CONNECTING 10' (TUBING) ×1

## 2015-07-22 NOTE — Anesthesia Preprocedure Evaluation (Addendum)
Anesthesia Evaluation  Patient identified by MRN, date of birth, ID band Patient awake    Reviewed: Allergy & Precautions, NPO status , Patient's Chart, lab work & pertinent test results  Airway Mallampati: I  TM Distance: >3 FB Neck ROM: Full    Dental  (+) Teeth Intact, Dental Advisory Given   Pulmonary Current Smoker,    breath sounds clear to auscultation       Cardiovascular hypertension, Pt. on medications and Pt. on home beta blockers  Rhythm:Regular Rate:Normal     Neuro/Psych    GI/Hepatic   Endo/Other  diabetes, Well Controlled, Type 2, Oral Hypoglycemic AgentsMorbid obesity  Renal/GU      Musculoskeletal   Abdominal   Peds  Hematology   Anesthesia Other Findings   Reproductive/Obstetrics                           Anesthesia Physical Anesthesia Plan  ASA: III  Anesthesia Plan: General   Post-op Pain Management:    Induction: Intravenous  Airway Management Planned: LMA  Additional Equipment:   Intra-op Plan:   Post-operative Plan: Extubation in OR  Informed Consent: I have reviewed the patients History and Physical, chart, labs and discussed the procedure including the risks, benefits and alternatives for the proposed anesthesia with the patient or authorized representative who has indicated his/her understanding and acceptance.   Dental advisory given  Plan Discussed with: CRNA, Anesthesiologist and Surgeon  Anesthesia Plan Comments:         Anesthesia Quick Evaluation

## 2015-07-22 NOTE — Anesthesia Postprocedure Evaluation (Signed)
Anesthesia Post Note  Patient: Dwayne LarsenDavid Kontos  Procedure(s) Performed: Procedure(s) (LRB): TRANSURETHRAL RESECTION OF THE PROSTATE  (N/Vargas)  Patient location during evaluation: PACU Anesthesia Type: General Level of consciousness: awake and alert Pain management: pain level controlled Vital Signs Assessment: post-procedure vital signs reviewed and stable Respiratory status: spontaneous breathing, nonlabored ventilation, respiratory function stable and patient connected to nasal cannula oxygen Cardiovascular status: blood pressure returned to baseline and stable Postop Assessment: no signs of nausea or vomiting Anesthetic complications: no    Last Vitals:  Filed Vitals:   07/22/15 1230 07/22/15 1245  BP: 120/71 110/82  Pulse: 65 66  Temp:  36.6 C  Resp: 10 12    Last Pain:  Filed Vitals:   07/22/15 1250  PainSc: 5                  Wally Shevchenko,Dwayne Vargas

## 2015-07-22 NOTE — Progress Notes (Signed)
Received pt from PACU, VS obtained, oriented to unit, call light placed in reach

## 2015-07-22 NOTE — Transfer of Care (Signed)
Immediate Anesthesia Transfer of Care Note  Patient: Dwayne LarsenDavid Vargas  Procedure(s) Performed: Procedure(s): TRANSURETHRAL RESECTION OF THE PROSTATE  (N/A)  Patient Location: PACU  Anesthesia Type:General  Level of Consciousness: Patient easily awoken, sedated, comfortable, cooperative, following commands, responds to stimulation.   Airway & Oxygen Therapy: Patient spontaneously breathing, ventilating well, oxygen via simple oxygen mask.  Post-op Assessment: Report given to PACU RN, vital signs reviewed and stable, moving all extremities.   Post vital signs: Reviewed and stable.  Complications: No apparent anesthesia complications

## 2015-07-22 NOTE — Anesthesia Procedure Notes (Signed)
Procedure Name: LMA Insertion Date/Time: 07/22/2015 10:57 AM Performed by: Thornell MuleSTUBBLEFIELD, Lucilia Yanni G Pre-anesthesia Checklist: Patient identified, Emergency Drugs available, Suction available and Patient being monitored Patient Re-evaluated:Patient Re-evaluated prior to inductionOxygen Delivery Method: Circle system utilized Preoxygenation: Pre-oxygenation with 100% oxygen Intubation Type: IV induction LMA: LMA inserted LMA Size: 4.0 Number of attempts: 1 Placement Confirmation: positive ETCO2 Tube secured with: Tape Dental Injury: Teeth and Oropharynx as per pre-operative assessment

## 2015-07-22 NOTE — Care Management Note (Signed)
Case Management Note  Patient Details  Name: Dwayne LarsenDavid Vargas MRN: [id number] Date of Birth: March 24, 1945  Subjective/Objective: 70 y/o m admitted w/BPH. From home. Provided w/HHC agency as resource if HHC needed.Will need HHC order, [id number].                   Action/Plan:d/c plan home.   Expected Discharge Date:                  Expected Discharge Plan:  Home w Home Health Services  In-House Referral:     Discharge planning Services  CM Consult  Post Acute Care Choice:    Choice offered to:     DME Arranged:    DME Agency:     HH Arranged:    HH Agency:     Status of Service:  In process, will continue to follow  Medicare Important Message Given:    Date Medicare IM Given:    Medicare IM give by:    Date Additional Medicare IM Given:    Additional Medicare Important Message give by:     If discussed at Long Length of Stay Meetings, dates discussed:    Additional Comments:  Dwayne Vargas, Dwayne Lampron, RN 07/22/2015, 3:36 PM

## 2015-07-22 NOTE — Care Management Obs Status (Signed)
MEDICARE OBSERVATION STATUS NOTIFICATION   Patient Details  Name: Dwayne Vargas MRN: [id number] Date of Birth: 08/01/1945   Medicare Observation Status Notification Given:  Yes    Lanier ClamMahabir, Robet Crutchfield, RN 07/22/2015, 3:36 PM

## 2015-07-22 NOTE — H&P (Signed)
Chief Complaint BPH   History of Present Illness     [name] returns today for a 1 mo f/u after starting tamsulosin. He had an indwelling Foley catheter for four days in June when he was in the hospital for pneumonia and left thoracotomy.  He voids well. He has a good flow. He denies dysuria, hematuria. He reports that he had excision of penile cancer 4 years ago in [facility]. He relocated to [place] 4 months ago. His PSA is 0.41. His DRE was benign, 2+ in August 2016.    Interval History:  Returns today for complaints of intermittency and weak stream. He also notes straining to initiate his urinary stream. Surprisingly this is only probably about half the time. On some occasions, he is able to urinate without much difficulty. His IPSS score is 13/5. He has nocturia 4-5 times per night.     He also complains of erectile dysfunction. He was given sildenafil by Dr. Brunilda Vargas which does not work at maximum dose. He is requesting a different medication this time. He is unable to obtain an erection this time.     February 2017 interval history:  The patient returns after being started on finasteride along with his Flomax. He reports no change of his finasteride. His IPSS score is unchanged at 13. He is very dissatisfied with his urinary quality of life including incomplete emptying, frequency, intermittency, weak stream, straining, nocturia 3. He would like to try another method for treating his urinary issues.      He was given samples of Cialis as last appointment. He is not an opportunity use it. He is going on vacation this weekend and plans to try the Cialis at that time.     IPSS: 13/4.  PVR: 148 cc    He denies any changes to his genitourinary or gastrointestinal system.    March 2017 interval history:  The patient underwent cystoscopy today which showed a visually obstructing prostate with no evidence of intravesical lobe.    Past Medical History Problems   1. History of Anxiety (F41.9) 2. History of arthritis (Z87.39) 3. History of depression (Z86.59) 4. History of diabetes mellitus (Z86.39)  Surgical History Problems  1. History of Cataract Surgery 2. History of Cholecystectomy 3. History of Knee Replacement 4. History of Nose Surgery 5. History of Skin Debridement  Current Meds 1. Dicyclomine HCl - 10 MG Oral Capsule;  Therapy: 29Sep2016 to Recorded 2. Finasteride 5 MG Oral Tablet; TAKE 1 TABLET DAILY;  Therapy: 21Nov2016 to (Evaluate:16Nov2017); Last Rx:21Nov2016 Ordered 3. LamoTRIgine 200 MG Oral Tablet;  Therapy: (Recorded:12Aug2016) to Recorded 4. Losartan Potassium 25 MG Oral Tablet;  Therapy: (Recorded:12Aug2016) to Recorded 5. Lovastatin TABS;  Therapy: (Recorded:12Aug2016) to Recorded 6. MetFORMIN HCl - 500 MG Oral Tablet;  Therapy: (Recorded:12Aug2016) to Recorded 7. Metoprolol Tartrate 25 MG Oral Tablet;  Therapy: (Recorded:12Aug2016) to Recorded 8. Tamsulosin HCl - 0.4 MG Oral Capsule; TAKE 1 CAPSULE Bedtime;  Therapy: 12Aug2016 to (Last Rx:01Mar2017)  Requested for: 05Mar2017 Ordered 9. Temazepam 15 MG Oral Capsule;  Therapy: (Recorded:12Aug2016) to Recorded 10. Venlafaxine HCl ER 150 MG Oral Capsule Extended Release 24 Hour;   Therapy: (Recorded:12Aug2016) to Recorded  Allergies Medication  1. Morphine Derivatives Non-Medication  2. Betadine  Family History Problems  1. Family history of kidney stones (Z84.1) : Father 2. Family history of malignant neoplasm of kidney (Z80.51) : Father  Social History Problems  1. Denied: History of Alcohol use 2. Caffeine use (F15.90) 3. Father deceased 74. Former  smoker (Z87.891)   1 pk for 40 yrs / quit 6 months ago 5. Mother deceased 13. Number of children   1 son/ 1 daughter 7. Retired 8. Widower  Physical Exam Constitutional: Well nourished . No acute distress.   ENT:. The ears and nose are normal in appearance.   Neck: The appearance of the neck is  normal.   Pulmonary: No respiratory distress.   Cardiovascular:. No peripheral edema.   Abdomen: The abdomen is soft and nontender.   Skin: Normal skin turgor and no visible rash.   Neuro/Psych:. Mood and affect are appropriate. No focal sensory deficits.    Results/Data Urine [Data Includes: Last 1 Day]   20Mar2017  COLOR YELLOW   APPEARANCE CLEAR   SPECIFIC GRAVITY 1.015   pH 6.0   GLUCOSE NEGATIVE   BILIRUBIN NEGATIVE   KETONE NEGATIVE   BLOOD NEGATIVE   PROTEIN 1+   NITRITE NEGATIVE   LEUKOCYTE ESTERASE NEGATIVE   SQUAMOUS EPITHELIAL/HPF 0-5 HPF  WBC 0-5 WBC/HPF  RBC NONE SEEN RBC/HPF  BACTERIA NONE SEEN HPF  CRYSTALS NONE SEEN HPF  CASTS NONE SEEN LPF  Yeast NONE SEEN HPF   Procedure I reviewed his urodynamic studies. She points include a first sensation at 456 cc. Desire to void at 561 cc. He had a maximum flow rate of 13 cc with a detrusor pressure of 63 cm of H2O at that time. He voided 521 cc with a postvoid residual 185 cc. His voiding pattern was consistent with a prolonged obstructive flow.   Procedure: Cystoscopy   Indication: Lower Urinary Tract Symptoms.  Informed Consent: Risks, benefits, and potential adverse events were discussed and informed consent was obtained.  Prep: The patient was prepped with betadine.  Anesthesia:. Local anesthesia was administered intraurethrally with 2% lidocaine jelly.  Antibiotic prophylaxis: Ciprofloxacin.  Procedure Note:  Urethral meatus:. No abnormalities.  Anterior urethra: No abnormalities.  Prostatic urethra:. Estimated length was 5 cm. There was visual obstruction of the prostatic urethra. The lateral prostatic lobes were enlarged. No intravesical median lobe was visualized.  Bladder: Visulization was clear. A systematic survey of the bladder demonstrated no bladder tumors or stones. The mucosa was smooth without abnormalities. The patient tolerated the procedure well.  Complications: None.     Assessment Assessed  1. Benign localized prostatic hyperplasia with lower urinary tract symptoms (LUTS) (N40.1) 2. Erectile dysfunction of nonorganic origin (F52.21) 3. History of penile cancer (Z85.49)  I discussed the patient that he has failed medical therapy for his BPH. Cystoscopy and urodynamic studies did confirm him to have an obstructive outflow pattern. I discussed treatment options with him which included TURP versus greenlight laser. I discussed the risks and benefits of each. He has elected to undergo a transurethral resection of prostate. He understands risks, benefits and indications of this procedure. Understands that he'll be admitted to the hospital for 1 day or longer postoperatively. He will have a catheter postoperatively that may or may not be removed after surgery. He understands there is a slight risk of incontinence from the procedure. All questions were answered and the patient has elected to proceed.   Plan Health Maintenance  1. UA With REFLEX; [Do Not Release]; Status:Complete;   Done: 20Mar2017 01:19PM History of penile cancer  2. URINE CULTURE; Status:Hold For - Specimen/Data Collection,Appointment; Requested  for:20Mar2017;     1. BPH with LUTS  -schedule for TURP    2. Erectile dysfunction  The patient has failed sildenafil. Will undergo trial of Cialis.  3. History of penile carcinoma  We do not have the pathology reports this was done at another institution 4 years ago. There is no evidence of penile cancer on exam at this time.

## 2015-07-22 NOTE — Op Note (Signed)
Date of procedure: 07/22/2015  Preoperative diagnosis:  1. BPH   Postoperative diagnosis:  1. BPH  2. Meatal stenosis  Procedure: 1. Transurethral resection of prostate 2. Meatal dilation  Surgeon: Baruch Gouty, MD  Anesthesia: General  Complications: None  Intraoperative findings: The patient had a visually obstructive prostate that was resected to his visually unobstructed.  EBL: None  Specimens: Prostate chips to pathology  Drains: 24 French three-way catheter to CBI  Disposition: Stable to the postanesthesia care unit  Indication for procedure: The patient is a 70 y.o. male with BPH with obstructive voiding symptoms proven with urodynamic studies who presents today for definitive management via TURP.  After reviewing the management options for treatment, the patient elected to proceed with the above surgical procedure(s). We have discussed the potential benefits and risks of the procedure, side effects of the proposed treatment, the likelihood of the patient achieving the goals of the procedure, and any potential problems that might occur during the procedure or recuperation. Informed consent has been obtained.  Description of procedure: The patient was met in the preoperative area. All risks, benefits, and indications of the procedure were described in great detail. The patient consented to the procedure. Preoperative antibiotics were given. The patient was taken to the operative theater. General anesthesia was induced per the anesthesia service. The patient was then placed in the dorsal lithotomy position and prepped and draped in the usual sterile fashion. A preoperative timeout was called.   A 24 French resectoscope with visual obturator was attempted to be placed per urethra. The urethral meatus was too small to accommodate this. The urethral meatus was dilated with male sounds to 105 [place]. At this point, the 24 French resectoscope sheath was obturator was easily passed  in the patient's bladder per urethra atraumatic. Pan cystoscopy was unremarkable. The bilateral ureteral orifices was a verumontanum were placed. Attention was then placed resecting the visually obstructive prostate. The prostate was resected in thoracic surgery fashion. There was a small perforation at approximate 5:00 of the prostate capsule. Although this the prostate was visually unobstructed. Hemostasis was obtained with excellent. Prostate chips were evacuated with Ellik backward. The verumontanum as well as the bilateral ureteral orifices were noted to be intact at the end the procedure. A 24 French Foley catheter was then placed. A three-way catheter with 30 cc in the balloon. This again was done after hemostasis was obtained and all chips were evacuated. This was then started on mild CBI. The patient was then woken from anesthesia and transferred stable condition to postanesthesia care unit.  Plan: The patient will be admitted to the floor overnight to wean CBI. He will be discharged home in the morning with his Foley catheter due to the small perforation in the prostatic capsule. He will follow up next week for Foley catheter removal.  Baruch Gouty, M.D.

## 2015-07-23 DIAGNOSIS — N401 Enlarged prostate with lower urinary tract symptoms: Secondary | ICD-10-CM | POA: Diagnosis not present

## 2015-07-23 LAB — CBC
HEMATOCRIT: 38.9 % — AB (ref 39.0–52.0)
HEMOGLOBIN: 12.8 g/dL — AB (ref 13.0–17.0)
MCH: 29.2 pg (ref 26.0–34.0)
MCHC: 32.9 g/dL (ref 30.0–36.0)
MCV: 88.8 fL (ref 78.0–100.0)
Platelets: 153 10*3/uL (ref 150–400)
RBC: 4.38 MIL/uL (ref 4.22–5.81)
RDW: 13.4 % (ref 11.5–15.5)
WBC: 6.7 10*3/uL (ref 4.0–10.5)

## 2015-07-23 LAB — BASIC METABOLIC PANEL
Anion gap: 6 (ref 5–15)
BUN: 12 mg/dL (ref 6–20)
CHLORIDE: 105 mmol/L (ref 101–111)
CO2: 27 mmol/L (ref 22–32)
Calcium: 9.1 mg/dL (ref 8.9–10.3)
Creatinine, Ser: 1.07 mg/dL (ref 0.61–1.24)
GFR calc non Af Amer: >60 mL/min (ref >60)
Glucose, Bld: 142 mg/dL — ABNORMAL HIGH (ref 65–99)
POTASSIUM: 4.2 mmol/L (ref 3.5–5.1)
SODIUM: 138 mmol/L (ref 135–145)

## 2015-07-23 LAB — GLUCOSE, CAPILLARY
GLUCOSE-CAPILLARY: 107 mg/dL — AB (ref 65–99)
GLUCOSE-CAPILLARY: 161 mg/dL — AB (ref 65–99)
GLUCOSE-CAPILLARY: 166 mg/dL — AB (ref 65–99)

## 2015-07-23 NOTE — Progress Notes (Signed)
Patient given discharge, follow up, medication, and foley catheter care instructions, verbalized understanding, IV removed, leg bag given, family to transport home

## 2015-07-23 NOTE — Discharge Instructions (Signed)

## 2015-07-23 NOTE — Discharge Summary (Signed)
Physician Discharge Summary  Patient ID: Dwayne LarsenDavid Vargas MRN: [id number] DOB/AGE: May 23, 1945 70 y.o.  Admit date: 07/22/2015 Discharge date: 07/23/2015  Admission Diagnoses: BENIGN PROSTATIC HYPERPLASIA  Discharge Diagnoses:  Active Problems:   BPH (benign prostatic hyperplasia)   Discharged Condition: good  Hospital Course: He was admitted for elective transurethral resection of his prostate due to BPH with outlet obstruction. Surgery proceeded well and postoperatively he was doing well. A Foley catheter was left indwelling on continuous bladder irrigation. Overnight the patient had no difficulties and the following morning was having no pain. His urine had cleared with no clots. His abdomen was noted to be soft on exam and he was having no nausea or vomiting. He was felt ready for discharge.  Significant Diagnostic Studies: No results found.  Discharge Exam: Blood pressure 121/67, pulse 67, temperature 99.1 F (37.3 C), temperature source Oral, resp. rate 16, height 5\' 11"  (1.803 m), weight 100.245 kg (221 lb), SpO2 99 %. Abdomen is soft, flat and nontender. Catheter draining nearly clear with no clots.  Disposition: 01-Home or Self Care  Discharge Instructions    Discharge patient    Complete by:  As directed      Urinary leg bag    Complete by:  As directed   Give patient a leg bag on discharge.            Medication List    TAKE these medications        finasteride 5 MG tablet  Commonly known as:  PROSCAR  Take 5 mg by mouth at bedtime.     HYDROcodone-acetaminophen 5-325 MG tablet  Commonly known as:  NORCO  Take 1 tablet by mouth every 6 (six) hours as needed for moderate pain.     lamoTRIgine 200 MG tablet  Commonly known as:  LAMICTAL  Take 200 mg by mouth at bedtime.     losartan 25 MG tablet  Commonly known as:  COZAAR  Take 25 mg by mouth at bedtime.     lovastatin 10 MG tablet  Commonly known as:  MEVACOR  Take 10 mg by mouth at bedtime.     metFORMIN 500 MG tablet  Commonly known as:  GLUCOPHAGE  Take 500 mg by mouth 2 (two) times daily.     metoprolol tartrate 25 MG tablet  Commonly known as:  LOPRESSOR  Take 1 tablet (25 mg total) by mouth 2 (two) times daily.     mirtazapine 30 MG tablet  Commonly known as:  REMERON  Take 30 mg by mouth at bedtime.     multivitamin with minerals Tabs tablet  Take 1 tablet by mouth daily.     NASACORT ALLERGY 24HR 55 MCG/ACT Aero nasal inhaler  Generic drug:  triamcinolone  Place 2 sprays into the nose at bedtime as needed (For allergies.).     OXYCONTIN 30 MG 12 hr tablet  Generic drug:  oxyCODONE  Take 30 mg by mouth daily.     PROBIOTIC PO  Take 1 capsule by mouth daily.     tamsulosin 0.4 MG Caps capsule  Commonly known as:  FLOMAX  Take 0.4 mg by mouth at bedtime.     temazepam 15 MG capsule  Commonly known as:  RESTORIL  Take 15-30 mg by mouth at bedtime as needed for sleep.     venlafaxine XR 150 MG 24 hr capsule  Commonly known as:  EFFEXOR-XR  Take 300 mg by mouth every morning.     VITAMIN D PO  Take 1 tablet by mouth daily.     ziprasidone 40 MG capsule  Commonly known as:  GEODON  Take 40 mg by mouth daily with supper.           Follow-up Information    Follow up with Hildred Laser, MD.   Specialty:  Urology   Why:  office will call to remove foley catheter next week   Contact information:   [address] [place] [address] [phone]       Signed: [place]ett [place] 07/23/2015, 6:34 AM

## 2015-09-22 ENCOUNTER — Other Ambulatory Visit (HOSPITAL_COMMUNITY): Payer: Self-pay | Admitting: Psychiatry

## 2015-11-03 ENCOUNTER — Other Ambulatory Visit (HOSPITAL_COMMUNITY): Payer: Self-pay | Admitting: Psychiatry

## 2015-11-15 ENCOUNTER — Other Ambulatory Visit: Payer: Self-pay | Admitting: Urology

## 2015-11-17 ENCOUNTER — Encounter (HOSPITAL_BASED_OUTPATIENT_CLINIC_OR_DEPARTMENT_OTHER): Payer: Self-pay | Admitting: *Deleted

## 2015-11-18 ENCOUNTER — Encounter (HOSPITAL_BASED_OUTPATIENT_CLINIC_OR_DEPARTMENT_OTHER): Payer: Self-pay | Admitting: *Deleted

## 2015-11-18 NOTE — Progress Notes (Signed)
NPO AFTER MN.  ARRIVE AT 0745.  NEEDS ISTAT AND EKG.  WILL TAKE EFFEXOR, METOPROLOL, AND CRESTOR AM DOS W/ SIPS OF WATER.

## 2015-11-20 NOTE — Anesthesia Preprocedure Evaluation (Addendum)
Anesthesia Evaluation  Patient identified by MRN, date of birth, ID band Patient awake    History of Anesthesia Complications (+) history of anesthetic complications  Airway Mallampati: II  TM Distance: >3 FB Neck ROM: Full    Dental  (+) Teeth Intact   Pulmonary Current Smoker,    breath sounds clear to auscultation       Cardiovascular hypertension,  Rhythm:Regular Rate:Normal     Neuro/Psych Anxiety Dwayne Vargas/cerv fusion     GI/Hepatic negative GI ROS, Neg liver ROS,   Endo/Other  diabetes, Well Controlled  Renal/GU negative Renal ROS     Musculoskeletal negative musculoskeletal ROS (+)   Abdominal   Peds  Hematology   Anesthesia Other Findings   Reproductive/Obstetrics                            Anesthesia Physical Anesthesia Plan  ASA: III  Anesthesia Plan: General   Post-op Pain Management:    Induction: Intravenous  Airway Management Planned: LMA  Additional Equipment:   Intra-op Plan:   Post-operative Plan: Extubation in OR  Informed Consent: I have reviewed the patients History and Physical, chart, labs and discussed the procedure including the risks, benefits and alternatives for the proposed anesthesia with the patient or authorized representative who has indicated his/her understanding and acceptance.   Dental advisory given  Plan Discussed with: CRNA  Anesthesia Plan Comments:         Anesthesia Quick Evaluation

## 2015-11-21 ENCOUNTER — Ambulatory Visit (HOSPITAL_BASED_OUTPATIENT_CLINIC_OR_DEPARTMENT_OTHER): Payer: Medicare PPO | Admitting: Anesthesiology

## 2015-11-21 ENCOUNTER — Encounter (HOSPITAL_BASED_OUTPATIENT_CLINIC_OR_DEPARTMENT_OTHER): Payer: Self-pay

## 2015-11-21 ENCOUNTER — Encounter (HOSPITAL_BASED_OUTPATIENT_CLINIC_OR_DEPARTMENT_OTHER)
Admission: RE | Disposition: A | Discharge status: Home / Self Care | Payer: Self-pay | Source: Ambulatory Visit | Attending: Urology

## 2015-11-21 ENCOUNTER — Ambulatory Visit (HOSPITAL_BASED_OUTPATIENT_CLINIC_OR_DEPARTMENT_OTHER)
Admission: RE | Admit: 2015-11-21 | Discharge: 2015-11-21 | Disposition: A | Discharge status: Home / Self Care | Payer: Medicare PPO | Source: Ambulatory Visit | Attending: Urology | Admitting: Urology

## 2015-11-21 DIAGNOSIS — N359 Urethral stricture, unspecified: Secondary | ICD-10-CM | POA: Insufficient documentation

## 2015-11-21 DIAGNOSIS — Z87891 Personal history of nicotine dependence: Secondary | ICD-10-CM | POA: Diagnosis not present

## 2015-11-21 DIAGNOSIS — F419 Anxiety disorder, unspecified: Secondary | ICD-10-CM | POA: Insufficient documentation

## 2015-11-21 DIAGNOSIS — N401 Enlarged prostate with lower urinary tract symptoms: Secondary | ICD-10-CM | POA: Insufficient documentation

## 2015-11-21 DIAGNOSIS — F329 Major depressive disorder, single episode, unspecified: Secondary | ICD-10-CM | POA: Insufficient documentation

## 2015-11-21 DIAGNOSIS — Z8051 Family history of malignant neoplasm of kidney: Secondary | ICD-10-CM | POA: Insufficient documentation

## 2015-11-21 DIAGNOSIS — E1169 Type 2 diabetes mellitus with other specified complication: Secondary | ICD-10-CM | POA: Diagnosis not present

## 2015-11-21 DIAGNOSIS — Z7984 Long term (current) use of oral hypoglycemic drugs: Secondary | ICD-10-CM | POA: Insufficient documentation

## 2015-11-21 DIAGNOSIS — N529 Male erectile dysfunction, unspecified: Secondary | ICD-10-CM | POA: Diagnosis not present

## 2015-11-21 DIAGNOSIS — Z8549 Personal history of malignant neoplasm of other male genital organs: Secondary | ICD-10-CM | POA: Insufficient documentation

## 2015-11-21 DIAGNOSIS — R351 Nocturia: Secondary | ICD-10-CM | POA: Insufficient documentation

## 2015-11-21 DIAGNOSIS — Z96659 Presence of unspecified artificial knee joint: Secondary | ICD-10-CM | POA: Diagnosis not present

## 2015-11-21 DIAGNOSIS — I10 Essential (primary) hypertension: Secondary | ICD-10-CM | POA: Insufficient documentation

## 2015-11-21 DIAGNOSIS — N400 Enlarged prostate without lower urinary tract symptoms: Secondary | ICD-10-CM

## 2015-11-21 DIAGNOSIS — Z79899 Other long term (current) drug therapy: Secondary | ICD-10-CM | POA: Diagnosis not present

## 2015-11-21 DIAGNOSIS — N4289 Other specified disorders of prostate: Secondary | ICD-10-CM | POA: Insufficient documentation

## 2015-11-21 HISTORY — PX: TRANSURETHRAL INCISION OF PROSTATE: SHX2573

## 2015-11-21 HISTORY — DX: Benign prostatic hyperplasia with lower urinary tract symptoms: N40.1

## 2015-11-21 HISTORY — DX: Type 2 diabetes mellitus without complications: E11.9

## 2015-11-21 HISTORY — DX: Other specified disorders of prostate: N42.89

## 2015-11-21 HISTORY — DX: Poor urinary stream: R39.12

## 2015-11-21 HISTORY — DX: Personal history of malignant neoplasm of other male genital organs: Z85.49

## 2015-11-21 HISTORY — DX: Male erectile dysfunction, unspecified: N52.9

## 2015-11-21 HISTORY — DX: Legal blindness, as defined in USA: H54.8

## 2015-11-21 HISTORY — DX: Anxiety disorder, unspecified: F41.9

## 2015-11-21 HISTORY — DX: Irritable bowel syndrome, unspecified: K58.9

## 2015-11-21 LAB — POCT I-STAT 4, (NA,K, GLUC, HGB,HCT)
GLUCOSE: 132 mg/dL — AB (ref 65–99)
HEMATOCRIT: 45 % (ref 39.0–52.0)
Hemoglobin: 15.3 g/dL (ref 13.0–17.0)
Potassium: 4 mmol/L (ref 3.5–5.1)
SODIUM: 140 mmol/L (ref 135–145)

## 2015-11-21 LAB — GLUCOSE, CAPILLARY: GLUCOSE-CAPILLARY: 121 mg/dL — AB (ref 65–99)

## 2015-11-21 SURGERY — INCISION, PROSTATE, TRANSURETHRAL
Anesthesia: General

## 2015-11-21 MED ORDER — HYDROMORPHONE HCL 1 MG/ML IJ SOLN
0.2500 mg | INTRAMUSCULAR | Status: DC | PRN
  Administered 2015-11-21: 0.5 mg via INTRAVENOUS
  Filled 2015-11-21: qty 1

## 2015-11-21 MED ORDER — ONDANSETRON HCL 4 MG/2ML IJ SOLN
INTRAMUSCULAR | Status: DC | PRN
  Administered 2015-11-21: 4 mg via INTRAVENOUS

## 2015-11-21 MED ORDER — CEFAZOLIN SODIUM-DEXTROSE 2-4 GM/100ML-% IV SOLN
INTRAVENOUS | Status: AC
  Filled 2015-11-21: qty 100

## 2015-11-21 MED ORDER — LACTATED RINGERS IV SOLN
INTRAVENOUS | Status: DC
  Administered 2015-11-21 (×2): via INTRAVENOUS
  Filled 2015-11-21: qty 1000

## 2015-11-21 MED ORDER — DEXAMETHASONE SODIUM PHOSPHATE 4 MG/ML IJ SOLN
INTRAMUSCULAR | Status: DC | PRN
  Administered 2015-11-21: 10 mg via INTRAVENOUS

## 2015-11-21 MED ORDER — LIDOCAINE HCL 2 % EX GEL
CUTANEOUS | Status: AC
Start: 2015-11-21 — End: 2015-11-21
  Filled 2015-11-21: qty 5

## 2015-11-21 MED ORDER — HYDROMORPHONE HCL 1 MG/ML IJ SOLN
INTRAMUSCULAR | Status: AC
  Filled 2015-11-21: qty 1

## 2015-11-21 MED ORDER — CEFAZOLIN SODIUM-DEXTROSE 2-4 GM/100ML-% IV SOLN
2.0000 g | INTRAVENOUS | Status: AC
  Administered 2015-11-21: 2 g via INTRAVENOUS
  Filled 2015-11-21: qty 100

## 2015-11-21 MED ORDER — PROMETHAZINE HCL 25 MG/ML IJ SOLN
6.2500 mg | INTRAMUSCULAR | Status: DC | PRN
  Filled 2015-11-21: qty 1

## 2015-11-21 MED ORDER — BELLADONNA ALKALOIDS-OPIUM 16.2-60 MG RE SUPP
RECTAL | Status: AC
  Filled 2015-11-21: qty 1

## 2015-11-21 MED ORDER — SODIUM CHLORIDE 0.9 % IR SOLN
Status: DC | PRN
  Administered 2015-11-21: 2000 mL via INTRAVESICAL

## 2015-11-21 MED ORDER — FENTANYL CITRATE (PF) 100 MCG/2ML IJ SOLN
INTRAMUSCULAR | Status: AC
  Filled 2015-11-21: qty 2

## 2015-11-21 MED ORDER — PROPOFOL 10 MG/ML IV BOLUS
INTRAVENOUS | Status: DC | PRN
  Administered 2015-11-21: 180 mg via INTRAVENOUS

## 2015-11-21 MED ORDER — SODIUM CHLORIDE 0.9 % IV SOLN
Freq: Once | INTRAVENOUS | Status: DC
  Filled 2015-11-21: qty 1000

## 2015-11-21 MED ORDER — ONDANSETRON HCL 4 MG/2ML IJ SOLN
INTRAMUSCULAR | Status: AC
  Filled 2015-11-21: qty 2

## 2015-11-21 MED ORDER — FENTANYL CITRATE (PF) 100 MCG/2ML IJ SOLN
INTRAMUSCULAR | Status: DC | PRN
  Administered 2015-11-21: 25 ug via INTRAVENOUS
  Administered 2015-11-21: 100 ug via INTRAVENOUS
  Administered 2015-11-21 (×2): 25 ug via INTRAVENOUS
  Administered 2015-11-21: 100 ug via INTRAVENOUS
  Administered 2015-11-21: 25 ug via INTRAVENOUS

## 2015-11-21 MED ORDER — PROPOFOL 10 MG/ML IV BOLUS
INTRAVENOUS | Status: AC
  Filled 2015-11-21: qty 20

## 2015-11-21 MED ORDER — CEPHALEXIN 500 MG PO CAPS: 500.0000 mg | ORAL_CAPSULE | Freq: Three times a day (TID) | ORAL | 0 refills | Status: DC

## 2015-11-21 MED ORDER — LIDOCAINE 2% (20 MG/ML) 5 ML SYRINGE
INTRAMUSCULAR | Status: AC
  Filled 2015-11-21: qty 5

## 2015-11-21 MED ORDER — DEXAMETHASONE SODIUM PHOSPHATE 10 MG/ML IJ SOLN
INTRAMUSCULAR | Status: AC
  Filled 2015-11-21: qty 1

## 2015-11-21 MED ORDER — LIDOCAINE 2% (20 MG/ML) 5 ML SYRINGE
INTRAMUSCULAR | Status: DC | PRN
  Administered 2015-11-21: 100 mg via INTRAVENOUS

## 2015-11-21 MED ORDER — HYDROMORPHONE HCL 4 MG/ML IJ SOLN
INTRAMUSCULAR | Status: AC
  Filled 2015-11-21: qty 1

## 2015-11-21 MED ORDER — HYDROCODONE-ACETAMINOPHEN 5-325 MG PO TABS: 1.0000 | ORAL_TABLET | Freq: Four times a day (QID) | ORAL | 0 refills | Status: DC | PRN

## 2015-11-21 SURGICAL SUPPLY — 31 items
BAG DRAIN URO-CYSTO SKYTR STRL (DRAIN) IMPLANT
BAG URINE DRAINAGE (UROLOGICAL SUPPLIES) ×3 IMPLANT
BAG URINE LEG 19OZ MD ST LTX (BAG) ×3 IMPLANT
BAG URINE LEG 500ML (DRAIN) ×3 IMPLANT
CATH FOLEY 2WAY SLVR  5CC 18FR (CATHETERS) ×2
CATH FOLEY 2WAY SLVR  5CC 22FR (CATHETERS)
CATH FOLEY 2WAY SLVR 30CC 20FR (CATHETERS) IMPLANT
CATH FOLEY 2WAY SLVR 5CC 18FR (CATHETERS) ×1 IMPLANT
CATH FOLEY 2WAY SLVR 5CC 22FR (CATHETERS) IMPLANT
CATH INTERMIT  6FR 70CM (CATHETERS) IMPLANT
CLOTH BEACON ORANGE TIMEOUT ST (SAFETY) ×3 IMPLANT
ELECT BIVAP BIPO 22/24 DONUT (ELECTROSURGICAL) ×3
ELECT REM PT RETURN 9FT ADLT (ELECTROSURGICAL)
ELECTRD BIVAP BIPO 22/24 DONUT (ELECTROSURGICAL) ×1 IMPLANT
ELECTRODE REM PT RTRN 9FT ADLT (ELECTROSURGICAL) IMPLANT
EVACUATOR MICROVAS BLADDER (UROLOGICAL SUPPLIES) IMPLANT
GLOVE BIO SURGEON STRL SZ7.5 (GLOVE) ×3 IMPLANT
GOWN STRL REUS W/ TWL LRG LVL3 (GOWN DISPOSABLE) ×2 IMPLANT
GOWN STRL REUS W/ TWL XL LVL3 (GOWN DISPOSABLE) ×2 IMPLANT
GOWN STRL REUS W/TWL LRG LVL3 (GOWN DISPOSABLE) ×4
GOWN STRL REUS W/TWL XL LVL3 (GOWN DISPOSABLE) ×4
GUIDEWIRE STR DUAL SENSOR (WIRE) IMPLANT
KIT ROOM TURNOVER WOR (KITS) ×3 IMPLANT
LOOP CUT BIPOLAR 24F LRG (ELECTROSURGICAL) IMPLANT
MANIFOLD NEPTUNE II (INSTRUMENTS) IMPLANT
PACK CYSTO (CUSTOM PROCEDURE TRAY) ×3 IMPLANT
SET ASPIRATION TUBING (TUBING) IMPLANT
SYRINGE 10CC LL (SYRINGE) ×3 IMPLANT
SYRINGE IRR TOOMEY STRL 70CC (SYRINGE) IMPLANT
TUBE CONNECTING 12'X1/4 (SUCTIONS)
TUBE CONNECTING 12X1/4 (SUCTIONS) IMPLANT

## 2015-11-21 NOTE — Op Note (Signed)
Date of procedure: 11/21/15  Preoperative diagnosis:  1. BPH 2. Prostatic urethral stricture   Postoperative diagnosis:  1. BPH 2. Prostatic urethral stricture 3. Meatal stenosis   Procedure: 1. Meatal dilation 2. Transurethral incision of prostate  Surgeon: Baruch Gouty, MD  Anesthesia: General  Complications: None  Intraoperative findings: The patient had meatal stenosis which was dilated up to 28 [place] to accommodate the 24 [place] resectoscope. Using the button, the scar tissue in the prostate urethra from previous TURP was incised to form a wide open prostatic urethra.  EBL: None  Specimens: None  Drains: 18 French Foley catheter  Disposition: Stable to the postanesthesia care unit  Indication for procedure: The patient is a 70 y.o. male with history of BPH status post transurethral resection of prostate in also developed a prostate urethral stricture. This was dilated with in the office however returned. He presents today for transurethral incision of the prostatic stricture.  After reviewing the management options for treatment, the patient elected to proceed with the above surgical procedure(s). We have discussed the potential benefits and risks of the procedure, side effects of the proposed treatment, the likelihood of the patient achieving the goals of the procedure, and any potential problems that might occur during the procedure or recuperation. Informed consent has been obtained.  Description of procedure: The patient was met in the preoperative area. All risks, benefits, and indications of the procedure were described in great detail. The patient consented to the procedure. Preoperative antibiotics were given. The patient was taken to the operative theater. General anesthesia was induced per the anesthesia service. The patient was then placed in the dorsal lithotomy position and prepped and draped in the usual sterile fashion. A preoperative timeout was called.   A  24 French resectoscope with visual after inserted in the patient's urethra per meatus. Prior to insertion his meatus had to be dilated with male urethral sounds to 22 [place] accommodate the resectoscope. There are no strictures within his bulbar penile urethra. There was a stricture as previously mentioned and the prostatic fossa from previous TURP. This was incised with the button cautery until all scar tissue was obliterated. The prostatic fossa was wide open at this point. The verumontanum and the ureteral orifices bilaterally were intact and procedure. Hemostasis was excellent. The patient's bladder was filled and the resectoscope removed. An 39 French Foley catheter was placed to dependent drainage with return of clear fluid. No significant hematuria. The patient was woke from anesthesia and transferred stable condition to postanesthesia care unit.  Plan: The patient will follow-up tomorrow for nurse visit to remove the Foley. He'll see me in one month to assess his symptoms.  Baruch Gouty, M.D.

## 2015-11-21 NOTE — Transfer of Care (Signed)
Immediate Anesthesia Transfer of Care Note  Patient: Dwayne LarsenDavid Gasior  Procedure(s) Performed: Procedure(s) (LRB): TRANSURETHRAL INCISION OF THE PROSTATE (TUIP) (N/A)  Patient Location: PACU  Anesthesia Type: General  Level of Consciousness: awake, sedated, patient cooperative and responds to stimulation  Airway & Oxygen Therapy: Patient Spontanous Breathing and Patient connected to face mask oxygen  Post-op Assessment: Report given to PACU RN, Post -op Vital signs reviewed and stable and Patient moving all extremities  Post vital signs: Reviewed and stable  Complications: No apparent anesthesia complications

## 2015-11-21 NOTE — Anesthesia Procedure Notes (Signed)
Procedure Name: LMA Insertion Date/Time: 11/21/2015 9:22 AM Performed by: Jessica PriestBEESON, Tereasa Yilmaz C Pre-anesthesia Checklist: Patient identified, Emergency Drugs available, Suction available and Patient being monitored Patient Re-evaluated:Patient Re-evaluated prior to inductionOxygen Delivery Method: Circle system utilized Preoxygenation: Pre-oxygenation with 100% oxygen Intubation Type: IV induction Ventilation: Mask ventilation without difficulty LMA: LMA inserted LMA Size: 5.0 Number of attempts: 1 Airway Equipment and Method: Bite block Placement Confirmation: positive ETCO2 and breath sounds checked- equal and bilateral Tube secured with: Tape Dental Injury: Teeth and Oropharynx as per pre-operative assessment

## 2015-11-21 NOTE — Discharge Instructions (Signed)
Post Anesthesia Home Care Instructions  Activity: Get plenty of rest for the remainder of the day. A responsible adult should stay with you for 24 hours following the procedure.  For the next 24 hours, DO NOT: -Drive a car -Advertising copywriter -Drink alcoholic beverages -Take any medication unless instructed by your physician -Make any legal decisions or sign important papers.  Meals: Start with liquid foods such as gelatin or soup. Progress to regular foods as tolerated. Avoid greasy, spicy, heavy foods. If nausea and/or vomiting occur, drink only clear liquids until the nausea and/or vomiting subsides. Call your physician if vomiting continues.  Special Instructions/Symptoms: Your throat may feel dry or sore from the anesthesia or the breathing tube placed in your throat during surgery. If this causes discomfort, gargle with warm salt water. The discomfort should disappear within 24 hours.  If you had a scopolamine patch placed behind your ear for the management of post- operative nausea and/or vomiting:  1. The medication in the patch is effective for 72 hours, after which it should be removed.  Wrap patch in a tissue and discard in the trash. Wash hands thoroughly with soap and water. 2. You may remove the patch earlier than 72 hours if you experience unpleasant side effects which may include dry mouth, dizziness or visual disturbances. 3. Avoid touching the patch. Wash your hands with soap and water after contact with the patch.    Foley Catheter Care, Adult A Foley catheter is a soft, flexible tube. This tube is placed into your bladder to drain pee (urine). If you go home with this catheter in place, follow the instructions below. TAKING CARE OF THE CATHETER 1. Wash your hands with soap and water. 2. Put soap and water on a clean washcloth.  Clean the skin where the tube goes into your body.  Clean away from the tube site.  Never wipe toward the tube.  Clean the area using  a circular motion.  Remove all the soap. Pat the area dry with a clean towel. For males, reposition the skin that covers the end of the penis (foreskin). 3. Attach the tube to your leg with tape or a leg strap. Do not stretch the tube tight. If you are using tape, remove any stickiness left behind by past tape you used. 4. Keep the drainage bag below your hips. Keep it off the floor. 5. Check your tube during the day. Make sure it is working and draining. Make sure the tube does not curl, twist, or bend. 6. Do not pull on the tube or try to take it out. TAKING CARE OF THE DRAINAGE BAGS You will have a large overnight drainage bag and a small leg bag. You may wear the overnight bag any time. Never wear the small bag at night. Follow the directions below. Emptying the Drainage Bag Empty your drainage bag when it is  - full or at least 2-3 times a day. 1. Wash your hands with soap and water. 2. Keep the drainage bag below your hips. 3. Hold the dirty bag over the toilet or clean container. 4. Open the pour spout at the bottom of the bag. Empty the pee into the toilet or container. Do not let the pour spout touch anything. 5. Clean the pour spout with a gauze pad or cotton ball that has rubbing alcohol on it. 6. Close the pour spout. 7. Attach the bag to your leg with tape or a leg strap. 8. Wash your hands well.  Changing the Drainage Bag Change your bag once a month or sooner if it starts to smell or look dirty.  1. Wash your hands with soap and water. 2. Pinch the rubber tube so that pee does not spill out. 3. Disconnect the catheter tube from the drainage tube at the connection valve. Do not let the tubes touch anything. 4. Clean the end of the catheter tube with an alcohol wipe. Clean the end of a the drainage tube with a different alcohol wipe. 5. Connect the catheter tube to the drainage tube of the clean drainage bag. 6. Attach the new bag to the leg with tape or a leg strap. Avoid  attaching the new bag too tightly. 7. Wash your hands well. Cleaning the Drainage Bag 1. Wash your hands with soap and water. 2. Wash the bag in warm, soapy water. 3. Rinse the bag with warm water. 4. Fill the bag with a mixture of white vinegar and water (1 cup vinegar to 1 quart warm water [.2 liter vinegar to 1 liter warm water]). Close the bag and soak it for 30 minutes in the solution. 5. Rinse the bag with warm water. 6. Hang the bag to dry with the pour spout open and hanging downward. 7. Store the clean bag (once it is dry) in a clean plastic bag. 8. Wash your hands well. PREVENT INFECTION  Wash your hands before and after touching your tube.  Take showers every day. Wash the skin where the tube enters your body. Do not take baths. Replace wet leg straps with dry ones, if this applies.  Do not use powders, sprays, or lotions on the genital area. Only use creams, lotions, or ointments as told by your doctor.  For females, wipe from front to back after going to the bathroom.  Drink enough fluids to keep your pee clear or pale yellow unless you are told not to have too much fluid (fluid restriction).  Do not let the drainage bag or tubing touch or lie on the floor.  Wear cotton underwear to keep the area dry. GET HELP IF:  Your pee is cloudy or smells unusually bad.  Your tube becomes clogged.  You are not draining pee into the bag or your bladder feels full.  Your tube starts to leak. GET HELP RIGHT AWAY IF:  You have pain, puffiness (swelling), redness, or yellowish-white fluid (pus) where the tube enters the body.  You have pain in the belly (abdomen), legs, lower back, or bladder.  You have a fever.  You see blood fill the tube, or your pee is pink or red.  You feel sick to your stomach (nauseous), throw up (vomit), or have chills.  Your tube gets pulled out. MAKE SURE YOU:   Understand these instructions.  Will watch your condition.  Will get help right  away if you are not doing well or get worse.   This information is not intended to replace advice given to you by your health care provider. Make sure you discuss any questions you have with your health care provider.   Document Released: 06/09/2012 Document Revised: 03/05/2014 Document Reviewed: 06/09/2012 Elsevier Interactive Patient Education 2016 [employer]  CYSTOSCOPY HOME CARE INSTRUCTIONS  Activity: Rest for the remainder of the day.  Do not drive or operate equipment today.  You may resume normal activities in one to two days as instructed by your physician.   Meals: Drink plenty of liquids and eat light foods such as gelatin or  soup this evening.  You may return to a normal meal plan tomorrow.  Return to Work: You may return to work in one to two days or as instructed by your physician.  Special Instructions / Symptoms: Call your physician if any of these symptoms occur:   -persistent or heavy bleeding  -bleeding which continues after first few urination  -large blood clots that are difficult to pass  -urine stream diminishes or stops completely  -fever equal to or higher than 101 degrees Farenheit.  -cloudy urine with a strong, foul odor  -severe pain  Females should always wipe from front to back after elimination.  You may feel some burning pain when you urinate.  This should disappear with time.  Applying moist heat to the lower abdomen or a hot tub bath may help relieve the pain. \  Follow-Up / Date of Return Visit to Your Physician:   Call for an appointment to arrange follow-up.  Patient Signature:  ________________________________________________________  Nurse's Signature:  ________________________________________________________

## 2015-11-21 NOTE — H&P (Signed)
CC: BPH  HPI: Dwayne Vargas is a 70 year-old male established patient who is here for follow up regarding further evaluation of BPH and lower urinary tract symptoms.    s/p TURP     CC/HPI: I have a urethral stricture.     the patient had a TURP in May 2017. He was voiding afterwards until recently when his stream worsened. He underwent dilation of prostatic fossa stricture in August 2017.   He feels his symptoms have returned prior to dilation. He is having SP pain plus straining to urinate. On pyridium which has helped slightly.     ALLERGIES: Betadine Iodine Morphine Derivatives    MEDICATIONS: Pyridium 200 mg tablet 1 tablet PO TID PRN  Glipizide Er 2.5 mg tablet, extended release 24 hr  LamoTRIgine 200 MG Oral Tablet Oral  Losartan Potassium 25 MG Oral Tablet Oral  MetFORMIN HCl - 500 MG Oral Tablet Oral  Metoprolol Tartrate 25 MG Oral Tablet Oral  Mirtazapine 30 MG Oral Tablet Oral  Rosuvastatin Calcium 10 mg tablet  Venlafaxine HCl ER 150 MG Oral Capsule Extended Release 24 Hour Oral     GU PSH: Cysto Dilate Stricture (M or F) - 10/17/2015 Cystoscopy TURP - 07/26/2015      PSH Notes: Transurethral Resection Of Prostate (TURP), Skin Debridement, Knee Replacement, Cholecystectomy, Nose Surgery, Cataract Surgery   NON-GU PSH: Cholecystectomy - 10/08/2014 Revise Knee Joint - 10/08/2014    GU PMH: Postprocedural urethral stricture, male, Unspec - 10/17/2015 BPH w/o LUTS - 10/10/2015, BPH w/o LUTS - 09/26/2015 ED, arterial insufficiency - 09/26/2015 Other urethral stricture - 09/26/2015 BPH w/LUTS - 08/01/2015, BPH w/LUTS, Benign localized prostatic hyperplasia with lower urinary tract symptoms (LUTS) - 05/16/2015 Penile Cancer, History, History of penile cancer - 05/16/2015 Nocturia, Nocturia - 11/08/2014 Male ED, unspecified, Erectile dysfunction - 10/08/2014 Urinary Tract Inf, Unspec site, Urinary tract infection - 10/08/2014    NON-GU PMH: Male erectile disorder, Erectile  dysfunction of nonorganic origin - 05/16/2015 Type 2 diabetes mellitus with other specified complication, Erectile dysfunction associated with type 2 diabetes mellitus - 01/28/2015 Encounter for general adult medical examination without abnormal findings, Encounter for preventive health examination - 01/17/2015 Anxiety, Anxiety - 10/08/2014 Personal history of other diseases of the musculoskeletal system and connective tissue, History of arthritis - 10/08/2014 Personal history of other endocrine, nutritional and metabolic disease, History of diabetes mellitus - 10/08/2014 Personal history of other mental and behavioral disorders, History of depression - 10/08/2014    FAMILY HISTORY: Kidney Stones - Runs In Family malignant neoplasm of kidney - Runs In Family   SOCIAL HISTORY: Marital Status: Widowed Current Smoking Status: Patient does not smoke anymore. Smoked for 40 years. Smoked 1 pack per day.  Drinks 1 caffeinated drink per day.     Notes: Caffeine use, Father deceased, Retired, Mother deceased, Number of children, Former smoker, Alcohol use, Widower   REVIEW OF SYSTEMS:    GU Review Male:   Patient reports burning/ pain with urination, stream starts and stops, trouble starting your stream, and erection problems. Patient denies frequent urination, hard to postpone urination, get up at night to urinate, leakage of urine, have to strain to urinate , and penile pain.  Gastrointestinal (Upper):   Patient denies nausea, vomiting, and indigestion/ heartburn.  Gastrointestinal (Lower):   Patient denies diarrhea and constipation.  Constitutional:   Patient denies fever, night sweats, weight loss, and fatigue.  Skin:   Patient denies skin rash/ lesion and itching.  Eyes:   Patient denies  blurred vision and double vision.  Ears/ Nose/ Throat:   Patient denies sore throat and sinus problems.  Hematologic/Lymphatic:   Patient denies swollen glands and easy bruising.  Cardiovascular:   Patient denies  leg swelling and chest pains.  Respiratory:   Patient denies cough and shortness of breath.  Endocrine:   Patient denies excessive thirst.  Musculoskeletal:   Patient reports back pain and joint pain.   Neurological:   Patient denies headaches and dizziness.  Psychologic:   Patient reports depression and anxiety.    VITAL SIGNS:      11/11/2015 08:21 AM  BP 103/68 mmHg  Pulse 69 /min  Temperature 98.4 F / 37 C   MULTI-SYSTEM PHYSICAL EXAMINATION:    Constitutional: Well-nourished. No physical deformities. Normally developed. Good grooming.  Neck: Neck symmetrical, not swollen. Normal tracheal position.  Respiratory: No labored breathing, no use of accessory muscles.   Cardiovascular: Normal temperature, normal extremity pulses, no swelling, no varicosities.  Skin: No paleness, no jaundice, no cyanosis. No lesion, no ulcer, no rash.  Gastrointestinal: No mass, no tenderness, no rigidity, non obese abdomen.  Eyes: Normal conjunctivae. Normal eyelids.  Ears, Nose, Mouth, and Throat: Left ear no scars, no lesions, no masses. Right ear no scars, no lesions, no masses. Nose no scars, no lesions, no masses. Normal hearing. Normal lips.  Musculoskeletal: Normal gait and station of head and neck.     PAST DATA REVIEWED:  Source Of History:  Patient  Records Review:   Previous Patient Records   10/09/14  PSA  Total PSA 0.41     PROCEDURES:         Flexible Cystoscopy - 52000  Risks, benefits, and some of the potential complications of the procedure were discussed at length with the patient including infection, bleeding, voiding discomfort, urinary retention, fever, chills, sepsis, and others. All questions were answered. Informed consent was obtained. Antibiotic prophylaxis was given. Sterile technique and intraurethral analgesia were used.  Meatus:  Normal size. Normal location. Normal condition.  Urethra:  No strictures.  External Sphincter:  Normal.  Verumontanum:  Normal.  Prostate:   Non-obstructing. No hyperplasia. previously dilated prostatic stricture beginning to reform. able to pass cystoscope past it.  Bladder Neck:  Non-obstructing.  Ureteral Orifices:  Normal location. Normal size. Normal shape. Effluxed clear urine.  Bladder:  No trabeculation. No tumors. Normal mucosa. No stones.      The lower urinary tract was carefully examined. The procedure was well-tolerated and without complications. Antibiotic instructions were given. Instructions were given to call the office immediately for bloody urine, difficulty urinating, urinary retention, painful or frequent urination, fever, chills, nausea, vomiting or other illness. The patient stated that he understood these instructions and would comply with them.         Urinalysis w/Scope - 81001 Dipstick Dipstick Cont'd Micro  Specimen: Voided Bilirubin: Invalid WBC/hpf: 0-5/hpf  Color: Orange Ketones: Invalid RBC/hpf: 0-2/hpf  Appearance: Clear Blood: Invalid Bacteria: NS (Not Seen)  Specific Gravity: Invalid Protein: Invalid Cystals: NS (Not Seen)  pH: Invalid Urobilinogen: Invalid Casts: NS (Not Seen)  Glucose: Invalid Nitrites: Invalid Trichomonas: Not Present    Leukocyte Esterase: Invalid Mucous: Not Present      Epithelial Cells: 0-5/hpf      Yeast: NS (Not Seen)      Sperm: Not Present    ASSESSMENT:      ICD-10 Details  1 GU:   BPH w/o LUTS - N40.0   2   Urethral Stricture, Unspec -  N35.9    PLAN:           Orders Labs Urine Culture and Sensitivity          Schedule Procedure: Unspecified Date - Cystoscopy TUIP - 40981          Document Letter(s):  Created for Patient: Clinical Summary   Created for [employer]. Abigail Miyamoto, MD         Notes:   the patient has recurrence of his prosthetic urethra stricture. I was able to passively dilate it with the cystoscope. I do feel it will recur. We discussed a transurethral incision of prostate and the operating room. We discussed the risks, benefits and  indications of this procedure. He understands the similar to his previous TURP. All questions are answered. The patient elected to proceed.

## 2015-11-21 NOTE — Anesthesia Postprocedure Evaluation (Signed)
Anesthesia Post Note  Patient: Dwayne LarsenDavid Vargas  Procedure(s) Performed: Procedure(s) (LRB): TRANSURETHRAL INCISION OF THE PROSTATE (TUIP) (N/A)  Patient location during evaluation: PACU Anesthesia Type: General Level of consciousness: awake and alert Pain management: pain level controlled Vital Signs Assessment: post-procedure vital signs reviewed and stable Respiratory status: spontaneous breathing, nonlabored ventilation, respiratory function stable and patient connected to nasal cannula oxygen Cardiovascular status: blood pressure returned to baseline and stable Postop Assessment: no signs of nausea or vomiting Anesthetic complications: no    Last Vitals:  Vitals:   11/21/15 1000 11/21/15 1015  BP: 139/76   Pulse: 77 71  Resp: 15 19  Temp: 36.5 C     Last Pain:  Vitals:   11/21/15 1045  TempSrc:   PainSc: 4                  Lealer Marsland,JAMES TERRILL

## 2015-11-22 ENCOUNTER — Encounter (HOSPITAL_BASED_OUTPATIENT_CLINIC_OR_DEPARTMENT_OTHER): Payer: Self-pay | Admitting: Urology

## 2016-01-27 ENCOUNTER — Encounter (HOSPITAL_COMMUNITY): Payer: Self-pay

## 2016-01-27 ENCOUNTER — Emergency Department (HOSPITAL_COMMUNITY)
Admission: EM | Admit: 2016-01-27 | Discharge: 2016-01-27 | Disposition: A | Discharge status: Home / Self Care | Payer: Medicare PPO | Attending: Emergency Medicine | Admitting: Emergency Medicine

## 2016-01-27 DIAGNOSIS — F17210 Nicotine dependence, cigarettes, uncomplicated: Secondary | ICD-10-CM | POA: Insufficient documentation

## 2016-01-27 DIAGNOSIS — R339 Retention of urine, unspecified: Secondary | ICD-10-CM | POA: Insufficient documentation

## 2016-01-27 DIAGNOSIS — Z8549 Personal history of malignant neoplasm of other male genital organs: Secondary | ICD-10-CM | POA: Diagnosis not present

## 2016-01-27 DIAGNOSIS — R10819 Abdominal tenderness, unspecified site: Secondary | ICD-10-CM | POA: Diagnosis not present

## 2016-01-27 DIAGNOSIS — E119 Type 2 diabetes mellitus without complications: Secondary | ICD-10-CM | POA: Diagnosis not present

## 2016-01-27 DIAGNOSIS — Z7984 Long term (current) use of oral hypoglycemic drugs: Secondary | ICD-10-CM | POA: Insufficient documentation

## 2016-01-27 LAB — URINALYSIS, ROUTINE W REFLEX MICROSCOPIC
BILIRUBIN URINE: NEGATIVE
GLUCOSE, UA: NEGATIVE mg/dL
KETONES UR: NEGATIVE mg/dL
LEUKOCYTES UA: NEGATIVE
NITRITE: NEGATIVE
PH: 6 (ref 5.0–8.0)
Protein, ur: NEGATIVE mg/dL
SPECIFIC GRAVITY, URINE: 1.005 (ref 1.005–1.030)

## 2016-01-27 LAB — URINE MICROSCOPIC-ADD ON: BACTERIA UA: NONE SEEN

## 2016-01-27 NOTE — ED Provider Notes (Signed)
[facility]-EMERGENCY DEPT Provider Note   CSN: [id number] Arrival date & time: 01/27/16  1510     History   Chief Complaint Chief Complaint  Patient presents with  . Urinary Retention    HPI  Blood pressure 151/91, pulse 84, resp. rate 18, height 5\' 11"  (1.803 m), weight 99 kg, SpO2 98 %.  Dwayne Vargas is a 70 y.o. male complaining of Difficulty voiding and suprapubic pressure and pain starting yesterday afternoon at about 4 and worsening significantly today. Patient had recent transurethral incision of the prostate steroid injection on 1127 at [facility] for a bladder neck contracture. Patient states he was able to give a small amount of urine for urinalysis while in the waiting room but he still feels the pressure and pain. This is the third procedure he's had for urinary obstruction. He was taking oxybutynin 5 mg 3 times a day. Valentina Lucks was called in for Bactrim by urology. An eyes fevers, chills, flank pain, nausea vomiting.   Urology: Logan Bores  Past Medical History:  Diagnosis Date  . Anxiety   . Benign localized prostatic hyperplasia with lower urinary tract symptoms (LUTS)   . Bipolar 1 disorder (HCC)   . Complication of anesthesia    limited neck motion due to cervical spine surgery  . Depression   . ED (erectile dysfunction) of organic origin   . History of penile cancer    s/p  excision of cancer 2013  . HOH (hard of hearing)    left ear  . Hypertension   . Irritable bowel    intermittant diarrhea  secondary to antibiotics  . Legally blind in left eye, as defined in [place]   . Stricture, prostate    urethra  . Type 2 diabetes mellitus (HCC)   . Weak urinary stream    intermittant    Patient Active Problem List   Diagnosis Date Noted  . BPH (benign prostatic hyperplasia) 07/22/2015  . Empyema (HCC) 08/12/2014  . Diabetes mellitus without complication (HCC) 08/10/2014  . Community acquired pneumonia 08/10/2014  . Pleural effusion 08/10/2014  . HTN (hypertension)  08/10/2014  . Pneumonia 08/10/2014  . CAP (community acquired pneumonia) 08/10/2014    Past Surgical History:  Procedure Laterality Date  . CATARACT EXTRACTION W/ INTRAOCULAR LENS  IMPLANT, BILATERAL  2007  . CERVICAL FUSION  05/ 2016   [facility]   C2 -- C4  . CHOLECYSTECTOMY  2013  approx  . EXCISION PENILE CANCER  2013  . NASAL SEPTUM SURGERY  x2  last one 1990's  . SHOULDER ARTHROSCOPY WITH ROTATOR CUFF REPAIR Left 1990's  . TOTAL KNEE ARTHROPLASTY Bilateral left 2007/  right and revision in 2009  . TRANSURETHRAL INCISION OF PROSTATE N/A 11/21/2015   Procedure: TRANSURETHRAL INCISION OF THE PROSTATE (TUIP);  Surgeon: Hildred Laser, MD;  Location: [facility];  Service: Urology;  Laterality: N/A;  . TRANSURETHRAL RESECTION OF PROSTATE N/A 07/22/2015   Procedure: TRANSURETHRAL RESECTION OF THE PROSTATE ;  Surgeon: Hildred Laser, MD;  Location: [facility];  Service: Urology;  Laterality: N/A;  . VIDEO ASSISTED THORACOSCOPY (VATS)/THOROCOTOMY Left 08/12/2014   Procedure: VIDEO ASSISTED THORACOSCOPY (VATS)/THOROCOTOMY, DRAINAGE OF EMPYEMA;  Surgeon: Alleen Borne, MD;  Location: [facility] OR;  Service: Thoracic;  Laterality: Left;       Home Medications    Prior to Admission medications   Medication Sig Start Date End Date Taking? Authorizing Provider  cephALEXin (KEFLEX) 500 MG capsule Take 1 capsule (500 mg total) by mouth 3 (  three) times daily. 11/21/15   Hildred LaserBrian James Budzyn, MD  Cholecalciferol (VITAMIN D PO) Take 1 tablet by mouth daily.    Historical Provider, MD  glipiZIDE (GLUCOTROL XL) 2.5 MG 24 hr tablet Take 2.5 mg by mouth daily with breakfast.    Historical Provider, MD  HYDROcodone-acetaminophen (NORCO) 5-325 MG tablet Take 1 tablet by mouth every 6 (six) hours as needed for moderate pain. 11/21/15   Hildred LaserBrian James Budzyn, MD  lamoTRIgine (LAMICTAL) 200 MG tablet Take 200 mg by mouth at bedtime. 07/29/14   Historical Provider, MD  losartan (COZAAR) 25 MG tablet Take 25  mg by mouth at bedtime.  08/10/14   Historical Provider, MD  metFORMIN (GLUCOPHAGE) 500 MG tablet Take 500 mg by mouth 2 (two) times daily. 08/10/14   Historical Provider, MD  metoprolol tartrate (LOPRESSOR) 25 MG tablet Take 1 tablet (25 mg total) by mouth 2 (two) times daily. 08/18/14   Donielle Margaretann LovelessM Zimmerman, PA-C  mirtazapine (REMERON) 30 MG tablet Take 30 mg by mouth at bedtime.    Historical Provider, MD  Multiple Vitamin (MULTIVITAMIN WITH MINERALS) TABS tablet Take 1 tablet by mouth daily.    Historical Provider, MD  Probiotic Product (PROBIOTIC FORMULA) CAPS Take by mouth daily.    Historical Provider, MD  rosuvastatin (CRESTOR) 10 MG tablet Take 10 mg by mouth every morning.    Historical Provider, MD  triamcinolone (NASACORT ALLERGY 24HR) 55 MCG/ACT AERO nasal inhaler Place 2 sprays into the nose at bedtime as needed (For allergies.).     Historical Provider, MD  venlafaxine XR (EFFEXOR-XR) 150 MG 24 hr capsule Take 300 mg by mouth every morning.  07/29/14   Historical Provider, MD  ziprasidone (GEODON) 40 MG capsule Take 40 mg by mouth daily with supper. 06/23/15   Historical Provider, MD    Family History No family history on file.  Social History Social History  Substance Use Topics  . Smoking status: Current Every Day Smoker    Packs/day: 1.00    Years: 53.00    Types: Cigarettes  . Smokeless tobacco: Never Used  . Alcohol use No     Allergies   Contrast media [iodinated diagnostic agents]; Diazepam; Aspirin; Fentanyl; Morphine and related; Oxycontin [oxycodone hcl]; and Shellfish allergy   Review of Systems Review of Systems  10 systems reviewed and found to be negative, except as noted in the HPI.   Physical Exam Updated Vital Signs BP 151/91   Pulse 84   Resp 18   Ht 5\' 11"  (1.803 m)   Wt 99 kg   SpO2 98%   BMI 30.45 kg/m   Physical Exam  Constitutional: He is oriented to person, place, and time. He appears well-developed and well-nourished. No distress.    HENT:  Head: Normocephalic and atraumatic.  Mouth/Throat: Oropharynx is clear and moist.  Eyes: Conjunctivae and EOM are normal. Pupils are equal, round, and reactive to light.  Neck: Normal range of motion.  Cardiovascular: Normal rate, regular rhythm and intact distal pulses.   Pulmonary/Chest: Effort normal and breath sounds normal.  Abdominal: Soft. There is tenderness.  Normoactive bowel sounds, mild suprapubic tenderness to palpation with no palpable mass, guarding or rebound. No CVA tenderness to percussion bilaterally.  Musculoskeletal: Normal range of motion.  Neurological: He is alert and oriented to person, place, and time.  Skin: He is not diaphoretic.  Psychiatric: He has a normal mood and affect.  Nursing note and vitals reviewed.    ED Treatments / Results  Labs (all labs ordered are listed, but only abnormal results are displayed) Labs Reviewed  URINALYSIS, ROUTINE W REFLEX MICROSCOPIC (NOT AT [facility]) - Abnormal; Notable for the following:       Result Value   Hgb urine dipstick TRACE (*)    All other components within normal limits  URINE MICROSCOPIC-ADD ON - Abnormal; Notable for the following:    Squamous Epithelial / LPF 0-5 (*)    All other components within normal limits    EKG  EKG Interpretation None       Radiology No results found.  Procedures Procedures (including critical care time)  Medications Ordered in ED Medications - No data to display   Initial Impression / Assessment and Plan / ED Course  I have reviewed the triage vital signs and the nursing notes.  Pertinent labs & imaging results that were available during my care of the patient were reviewed by me and considered in my medical decision making (see chart for details).  Clinical Course as of Jan 27 1820  Fri Jan 27, 2016  1653 Pt not in examination room  [NP]    Clinical Course User Index [NP] Wynetta Emeryicole Maddyx Wieck, PA-C    Vitals:   01/27/16 1515  BP: 151/91  Pulse: 84   Resp: 18  TempSrc: Oral  SpO2: 98%  Weight: 99 kg  Height: 5\' 11"  (1.803 m)    Medications - No data to display  Dwayne LarsenDavid Gartland is 70 y.o. male presenting with Urinary retention onset one day ago, he had a recent surgery or days ago for bladder neck stricture. Had Foley removed on Wednesday, was urinating well until yesterday. He was able to produce a urine sample here which does not show any signs of infection, mild hemoglobin. Bladder scan with post void residual just over 300. Borderline PVR,however, at patient's request Foley is placed. He will follow with his urologist next week.  This is a shared visit with the attending physician who personally evaluated the patient and agrees with the care plan.   Evaluation does not show pathology that would require ongoing emergent intervention or inpatient treatment. Pt is hemodynamically stable and mentating appropriately. Discussed findings and plan with patient/guardian, who agrees with care plan. All questions answered. Return precautions discussed and outpatient follow up given.      Final Clinical Impressions(s) / ED Diagnoses   Final diagnoses:  Urinary retention    New Prescriptions New Prescriptions   No medications on file     Wynetta Emeryicole Rosario Kushner, PA-C 01/27/16 1808    Joni Reiningicole Marce Charlesworth, PA-C 01/27/16 1821

## 2016-01-27 NOTE — ED Provider Notes (Signed)
Medical screening examination/treatment/procedure(s) were conducted as a shared visit with non-physician practitioner(s) and myself.  I personally evaluated the patient during the encounter.   EKG Interpretation None     Patient here complaining of urinary retention. Recently had a Foley catheter removed several days ago. Will replace Foley and he will follow-up with his doctor   Lorre NickAnthony Chloe Flis, MD 01/27/16 1756

## 2016-01-27 NOTE — ED Notes (Signed)
Pt unable to void.  Bladder scan showing 

## 2016-01-27 NOTE — ED Notes (Signed)
Pt st's he removed his post surgery cath. On Wed.;  St's he can't empty his bladder since cath was removed.  Pt st's he voided in the lobby x's 2 but has not been able to void since then

## 2016-01-27 NOTE — ED Triage Notes (Signed)
Pt. [facility].   Yesterday afternoon he started to not urinate.  He is only going a few drops.  [facility] and they instructed him to come to Ed.  Bladder Scan showed 550 cc urine.

## 2016-04-16 ENCOUNTER — Ambulatory Visit (INDEPENDENT_AMBULATORY_CARE_PROVIDER_SITE_OTHER): Payer: Medicare PPO | Admitting: Endocrinology

## 2016-04-16 ENCOUNTER — Encounter: Payer: Self-pay | Admitting: Endocrinology

## 2016-04-16 VITALS — BP 122/70 | HR 76 | Ht 71.0 in | Wt 220.0 lb

## 2016-04-16 DIAGNOSIS — E1142 Type 2 diabetes mellitus with diabetic polyneuropathy: Secondary | ICD-10-CM

## 2016-04-16 DIAGNOSIS — E782 Mixed hyperlipidemia: Secondary | ICD-10-CM

## 2016-04-16 DIAGNOSIS — E1165 Type 2 diabetes mellitus with hyperglycemia: Secondary | ICD-10-CM | POA: Diagnosis not present

## 2016-04-16 MED ORDER — SITAGLIPTIN PHOSPHATE 100 MG PO TABS: 100.0000 mg | ORAL_TABLET | Freq: Every day | ORAL | 2 refills | Status: DC

## 2016-04-16 NOTE — Patient Instructions (Signed)
Take both Metformin at dinner Stop Glipizide  Check blood sugars on waking up    Also check blood sugars about 2 hours after a meal and do this after different meals by rotation  Recommended blood sugar levels on waking up is 90-130 and about 2 hours after meal is 130-160  Please bring your blood sugar monitor to each visit, thank you

## 2016-04-16 NOTE — Progress Notes (Signed)
Patient ID: Dwayne Vargas, male   DOB: 24-Apr-1945, 71 y.o.   MRN: [id number]            Reason for Appointment: Consultation for Type 2 Diabetes  Referring physician: Henrine Screws   History of Present Illness:          Date of diagnosis of type 2 diabetes mellitus: 2013       Background history:   He apparently had mild diabetes at onset and was treated with metformin alone, he thinks for most of the duration of his diabetes he was on metformin once a day, 500 mg Not clear if his blood sugars have been adequately controlled in the past He thinks he has been on twice a day metformin for several months because of higher A1c at some point in 2017, no records available  Recent history: :       Non-insulin hypoglycemic drugs the patient is taking are: Glipizide ER 2.5 mg daily, metformin 500 mg twice a day  Current management, blood sugar patterns and problems identified:  The patient has been on metformin and glipizide for the last few months but unclear when glipizide was started in 2017  He has been referred here because in 12/17 was having symptoms of hypoglycemia along with high sugars at other times  Review of his FREESTYLE monitor readings indicate mostly high fasting readings, ranging from 149-244 with only occasional readings below 180; he is checking his blood sugars before his first meal at different times ranging from 8:30 AM until 2 PM.  He thinks his sugars are higher in the morning because of eating late evening snacks and more carbohydrates at that time.  He says he has difficulty controlling his portions and carbohydrates in the evenings  He also has blood sugars in the afternoons and evenings and has some readings in the 70s and the late afternoon  May have some higher readings later at night but usually not over 155           Side effects from medications have been: None  Compliance with the medical regimen:  Hypoglycemia:  since 12.17 on walking  Glucose  monitoring:  done 1-2 times a day         Glucometer: Freestyle       Blood Glucose readings by time of day and averages from home record as above  Self-care: The diet that the patient has been following is: tries to limit foods and drinks which sugar .      Meal times are:  Breakfast is at Lunch: Dinner:   Typical meal intake: Breakfast is sausage, eggs and cheese, usually has a sandwich at lunch.  Snacks are sugar free cookies and Crackers               Dietician visit, most recent: None, previously has seen dietitian with diabetes teaching programs               Exercise:  none currently  Weight history: Previous range 210-235  Wt Readings from Last 3 Encounters:  04/16/16 220 lb (99.8 kg)  01/27/16 218 lb 5 oz (99 kg)  11/21/15 216 lb (98 kg)    Glycemic control:   Lab Results  Component Value Date   HGBA1C 7.2 (H) 07/07/2015   HGBA1C 9.5 (H) 08/11/2014   Lab Results  Component Value Date   CREATININE 1.07 07/23/2015   No results found for: MICRALBCREAT     Allergies as of 04/16/2016  Reactions   Contrast Media [iodinated Diagnostic Agents] Hives, Swelling   Diazepam Other (See Comments)   Patient does not want to use   Aspirin Other (See Comments)   GI upset   Fentanyl    Pt wishes not to have   Morphine And Related Other (See Comments)   Tremors    Oxycontin [oxycodone Hcl] Other (See Comments)   "pt does not wish to have"   Shellfish Allergy Hives   All fish and shellfish w/ iodine      Medication List       Accurate as of 04/16/16 11:59 PM. Always use your most recent med list.          gabapentin 300 MG capsule Commonly known as:  NEURONTIN Take by mouth.   glipiZIDE 2.5 MG 24 hr tablet Commonly known as:  GLUCOTROL XL Take 2.5 mg by mouth daily with breakfast.   HYDROcodone-acetaminophen 5-325 MG tablet Commonly known as:  NORCO Take 1 tablet by mouth every 6 (six) hours as needed for moderate pain.   lamoTRIgine 200 MG  tablet Commonly known as:  LAMICTAL Take 200 mg by mouth at bedtime.   losartan 25 MG tablet Commonly known as:  COZAAR Take 25 mg by mouth at bedtime.   metFORMIN 500 MG tablet Commonly known as:  GLUCOPHAGE Take 500 mg by mouth 2 (two) times daily.   metoprolol tartrate 25 MG tablet Commonly known as:  LOPRESSOR Take 1 tablet (25 mg total) by mouth 2 (two) times daily.   mirtazapine 30 MG tablet Commonly known as:  REMERON Take 30 mg by mouth at bedtime.   multivitamin with minerals Tabs tablet Take 1 tablet by mouth daily.   NASACORT ALLERGY 24HR 55 MCG/ACT Aero nasal inhaler Generic drug:  triamcinolone Place 2 sprays into the nose at bedtime as needed (For allergies.).   PROBIOTIC FORMULA Caps Take by mouth daily.   rosuvastatin 10 MG tablet Commonly known as:  CRESTOR Take 10 mg by mouth every morning.   sitaGLIPtin 100 MG tablet Commonly known as:  JANUVIA Take 1 tablet (100 mg total) by mouth daily.   temazepam 15 MG capsule Commonly known as:  RESTORIL Take 15 mg by mouth at bedtime as needed for sleep.   venlafaxine XR 150 MG 24 hr capsule Commonly known as:  EFFEXOR-XR Take 300 mg by mouth every morning.   VITAMIN D PO Take 1 tablet by mouth daily.   ziprasidone 40 MG capsule Commonly known as:  GEODON Take 40 mg by mouth daily with supper.       Allergies:  Allergies  Allergen Reactions  . Contrast Media [Iodinated Diagnostic Agents] Hives and Swelling  . Diazepam Other (See Comments)    Patient does not want to use  . Aspirin Other (See Comments)    GI upset  . Fentanyl     Pt wishes not to have  . Morphine And Related Other (See Comments)    Tremors   . Oxycontin [Oxycodone Hcl] Other (See Comments)    "pt does not wish to have"  . Shellfish Allergy Hives    All fish and shellfish w/ iodine    Past Medical History:  Diagnosis Date  . Anxiety   . Benign localized prostatic hyperplasia with lower urinary tract symptoms (LUTS)    . Bipolar 1 disorder (HCC)   . Complication of anesthesia    limited neck motion due to cervical spine surgery  . Depression   . ED (erectile  dysfunction) of organic origin   . History of penile cancer    s/p  excision of cancer 2013  . HOH (hard of hearing)    left ear  . Hypertension   . Irritable bowel    intermittant diarrhea  secondary to antibiotics  . Legally blind in left eye, as [place] in [place]   . Stricture, prostate    urethra  . Type 2 diabetes mellitus (HCC)   . Weak urinary stream    intermittant    Past Surgical History:  Procedure Laterality Date  . CATARACT EXTRACTION W/ INTRAOCULAR LENS  IMPLANT, BILATERAL  2007  . CERVICAL FUSION  05/ 2016   [facility]   C2 -- C4  . CHOLECYSTECTOMY  2013  approx  . EXCISION PENILE CANCER  2013  . NASAL SEPTUM SURGERY  x2  last one 1990's  . SHOULDER ARTHROSCOPY WITH ROTATOR CUFF REPAIR Left 1990's  . TOTAL KNEE ARTHROPLASTY Bilateral left 2007/  right and revision in 2009  . TRANSURETHRAL INCISION OF PROSTATE N/A 11/21/2015   Procedure: TRANSURETHRAL INCISION OF THE PROSTATE (TUIP);  Surgeon: Hildred LaserBrian James Budzyn, MD;  [facility];  Service: Urology;  Laterality: N/A;  . TRANSURETHRAL RESECTION OF PROSTATE N/A 07/22/2015   Procedure: TRANSURETHRAL RESECTION OF THE PROSTATE ;  Surgeon: Hildred LaserBrian James Budzyn, MD;  Location: [facility];  Service: Urology;  Laterality: N/A;  . VIDEO ASSISTED THORACOSCOPY (VATS)/THOROCOTOMY Left 08/12/2014   Procedure: VIDEO ASSISTED THORACOSCOPY (VATS)/THOROCOTOMY, DRAINAGE OF EMPYEMA;  Surgeon: Alleen BorneBryan K Bartle, MD;  Location: [facility];  Service: Thoracic;  Laterality: Left;    Family History  Problem Relation Age of Onset  . Diabetes Cousin     Social History:  reports that he has been smoking Cigarettes.  He has a 53.00 pack-year smoking history. He has never used smokeless tobacco. He reports that he does not drink alcohol or use drugs.   Review of Systems  Constitutional: Positive  for weight gain. Negative for reduced appetite and malaise.  HENT: Negative for trouble swallowing.   Eyes: Negative for blurred vision.  Respiratory: Negative for shortness of breath.   Gastrointestinal: Negative for diarrhea.       Has mild constipation  Endocrine: Negative for polydipsia.  Genitourinary:       Has nocturia 1-2x  Musculoskeletal: Positive for joint pain.  Skin: Negative for rash.  Allergic/Immunologic: Positive for rhinorrhea.       Has history of allergic rhinitis  Neurological: Positive for numbness. Negative for tingling and balance difficulty.       He has numbness in his toes for several months, this is a feeling of swelling but has some sensation.  He thinks this is fairly persistent although not every day.  Has symptoms despite taking gabapentin at night  Psychiatric/Behavioral: Positive for depressed mood.       He has had long-standing depression     Lipid history:  Treated with Crestor, no recent labs available   No results found for: CHOL, HDL, LDLCALC, LDLDIRECT, TRIG, CHOLHDL         Hypertension: Has had mild hypertension, taking only low-dose losartan  Most recent eye exam was 12/18  Most recent foot exam: 2/18   LABS:  No visits with results within 1 Week(s) from this visit.  Latest known visit with results is:  Admission on 01/27/2016, Discharged on 01/27/2016  Component Date Value Ref Range Status  . Color, Urine 01/27/2016 YELLOW  YELLOW Final  . APPearance 01/27/2016 CLEAR  CLEAR Final  . Specific Gravity, Urine 01/27/2016 1.005  1.005 - 1.030 Final  . pH 01/27/2016 6.0  5.0 - 8.0 Final  . Glucose, UA 01/27/2016 NEGATIVE  NEGATIVE mg/dL Final  . Hgb urine dipstick 01/27/2016 TRACE* NEGATIVE Final  . Bilirubin Urine 01/27/2016 NEGATIVE  NEGATIVE Final  . Ketones, ur 01/27/2016 NEGATIVE  NEGATIVE mg/dL Final  . Protein, ur 16/11/9602 NEGATIVE  NEGATIVE mg/dL Final  . Nitrite 54/10/8117 NEGATIVE  NEGATIVE Final  . Leukocytes, UA  01/27/2016 NEGATIVE  NEGATIVE Final  . Squamous Epithelial / LPF 01/27/2016 0-5* NONE SEEN Final  . WBC, UA 01/27/2016 0-5  0 - 5 WBC/hpf Final  . RBC / HPF 01/27/2016 0-5  0 - 5 RBC/hpf Final  . Bacteria, UA 01/27/2016 NONE SEEN  NONE SEEN Final    Physical Examination:  BP 122/70   Pulse 76   Ht 5\' 11"  (1.803 m)   Wt 220 lb (99.8 kg)   SpO2 97%   BMI 30.68 kg/m   GENERAL:         Patient has generalized obesity.   HEENT:         Eye exam shows normal external appearance.  Fundus exam shows no retinopathy. Oral exam shows normal mucosa .  NECK:   There is no lymphadenopathy Thyroid is not enlarged and no nodules felt.  Carotids are normal to palpation and no bruit heard  LUNGS:         Chest is symmetrical. Lungs are clear to auscultation.Marland Kitchen   HEART:         Heart sounds:  S1 and S2 are normal. No murmur or click heard., no S3 or S4.   ABDOMEN:   There is no distention present. Liver and spleen are not palpable. No other mass or tenderness present.   NEUROLOGICAL:   Ankle jerks are absent bilaterally.    Diabetic Foot Exam - Simple   Simple Foot Form Diabetic Foot exam was performed with the following findings:  Yes   Visual Inspection No deformities, no ulcerations, no other skin breakdown bilaterally:  Yes Sensation Testing See comments:  Yes Pulse Check Posterior Tibialis and Dorsalis pulse intact bilaterally:  Yes Comments He has variably decreased monofilament sensation in the toes distally as also on the distal plantar surfaces            Vibration sense is Absent on the left and markedly reduced in distal right first toes. MUSCULOSKELETAL:  There is no swelling or deformity of the peripheral joints. Spine is normal to inspection.   EXTREMITIES:     There is no edema. No skin lesions present.Marland Kitchen SKIN:       No rash or lesions of concern.        ASSESSMENT:  Diabetes type 2, Long-standing with BMI 31 Although his last A1c was excellent at 6.4 the patient is having  inconsistent blood sugar control during the day with high readings fasting and lower readings later in the day including tendency to mild hypoglycemia late afternoon His diet is inconsistent with eating large portions are carbohydrates in the evenings with dinner or late night snacks He has difficulty controlling his diet well but still does not want to see a dietitian as he thinks he knows what to do He has not been exercising recently but plans to start  He is on a combination of low-dose metformin and glipizide ER He is tending to have low normal sugars with occasional hypoglycemia symptoms with taking glipizide especially  in the afternoon  Complications of diabetes: Peripheral neuropathy with mild sensory loss, currently not having paresthesiae or pain  History of depression, hypertension, hyperlipidemia managed by PCP, prior history  not available  PLAN:     Since his fasting readings are mostly high he can try taking metformin once a day, 1000 mg with evening meal  He will stop glipizide and start JANUVIA to help regulate his blood sugars more consistently without hypoglycemia.  This may also potentially have stabilizing effect on his beta cell function.  Discussed how this works and given him patient information material to read   Given information booklet from [employer] on foot care and prevention of ulcers with presence of neuropathy  He may start exercising with the regular aerobic activity.  Discussed checking blood sugar either fasting or 2 hours after meals  He will bring his monitor for follow-up review  Will assess his level of control with fructosamine on his next visit in 6 weeks  Offered consultation with dietitian but he does not want to do so; also discussed use of GLP-1 drugs as a better way of controlling diabetes and controlling portions and providing weight loss but he is refusing to consider injectable drugs  Patient Instructions  Take both Metformin at dinner Stop  Glipizide  Check blood sugars on waking up    Also check blood sugars about 2 hours after a meal and do this after different meals by rotation  Recommended blood sugar levels on waking up is 90-130 and about 2 hours after meal is 130-160  Please bring your blood sugar monitor to each visit, thank you     Consultation note has been sent to the referring physician  [facility] 04/17/2016, 8:00 AM   Note: This office note was prepared with Dragon voice recognition system technology. Any transcriptional errors that result from this process are unintentional.

## 2016-04-17 ENCOUNTER — Encounter: Payer: Self-pay | Admitting: Endocrinology

## 2016-04-17 DIAGNOSIS — E1165 Type 2 diabetes mellitus with hyperglycemia: Secondary | ICD-10-CM | POA: Insufficient documentation

## 2016-04-17 DIAGNOSIS — E1142 Type 2 diabetes mellitus with diabetic polyneuropathy: Secondary | ICD-10-CM | POA: Insufficient documentation

## 2016-04-23 ENCOUNTER — Other Ambulatory Visit (HOSPITAL_COMMUNITY): Payer: Self-pay | Admitting: Psychiatry

## 2016-04-25 ENCOUNTER — Other Ambulatory Visit (HOSPITAL_COMMUNITY): Payer: Self-pay | Admitting: Psychiatry

## 2016-05-11 ENCOUNTER — Other Ambulatory Visit (INDEPENDENT_AMBULATORY_CARE_PROVIDER_SITE_OTHER): Payer: Medicare PPO

## 2016-05-11 DIAGNOSIS — E1165 Type 2 diabetes mellitus with hyperglycemia: Secondary | ICD-10-CM

## 2016-05-11 LAB — BASIC METABOLIC PANEL
BUN: 13 mg/dL (ref 6–23)
CALCIUM: 10.5 mg/dL (ref 8.4–10.5)
CO2: 27 mEq/L (ref 19–32)
CREATININE: 0.94 mg/dL (ref 0.40–1.50)
Chloride: 100 mEq/L (ref 96–112)
GFR: 83.95 mL/min (ref >60.00)
Glucose, Bld: 168 mg/dL — ABNORMAL HIGH (ref 70–99)
Potassium: 4.5 mEq/L (ref 3.5–5.1)
Sodium: 130 mEq/L — ABNORMAL LOW (ref 135–145)

## 2016-05-12 LAB — FRUCTOSAMINE: Fructosamine: 278 umol/L (ref 0–285)

## 2016-05-14 ENCOUNTER — Ambulatory Visit (INDEPENDENT_AMBULATORY_CARE_PROVIDER_SITE_OTHER): Payer: Medicare PPO | Admitting: Endocrinology

## 2016-05-14 ENCOUNTER — Encounter: Payer: Self-pay | Admitting: Endocrinology

## 2016-05-14 ENCOUNTER — Telehealth: Payer: Self-pay | Admitting: Endocrinology

## 2016-05-14 VITALS — BP 98/54 | HR 72 | Ht 71.0 in | Wt 227.0 lb

## 2016-05-14 DIAGNOSIS — E1165 Type 2 diabetes mellitus with hyperglycemia: Secondary | ICD-10-CM | POA: Diagnosis not present

## 2016-05-14 DIAGNOSIS — E1142 Type 2 diabetes mellitus with diabetic polyneuropathy: Secondary | ICD-10-CM | POA: Diagnosis not present

## 2016-05-14 DIAGNOSIS — E871 Hypo-osmolality and hyponatremia: Secondary | ICD-10-CM | POA: Diagnosis not present

## 2016-05-14 NOTE — Progress Notes (Signed)
Patient ID: Dwayne Vargas, male   DOB: [birth year], 71 y.o.   MRN: [id number]            Reason for Appointment:  Follow-up for Type 2 Diabetes  Referring physician: Henrine Screws   History of Present Illness:          Date of diagnosis of type 2 diabetes mellitus: 2013       Background history:   He apparently had mild diabetes at onset and was treated with metformin alone, he thinks for most of the duration of his diabetes he was on metformin once a day, 500 mg Not clear if his blood sugars have been adequately controlled in the past He thinks he has been on twice a day metformin for several months because of higher A1c at some point in 2017, no records available  Recent history: :       Non-insulin hypoglycemic drugs the patient is taking are: Januvia 100 mg, metformin 500 mg twice a day  Current management, blood sugar patterns and problems identified:  The patient has been switched from glipizide to Januvia on his initial consultation  With this change his blood sugars are overall improving and he has not had any further hypoglycemia that he was having previously  Also he was previously having high fasting readings which are improving  He was advised to try improving his diet with cutting back on higher fat foods also  However even with his improved blood sugars she has gained weight  He is not able to walk recently because of low back pain  His Freestyle test strips are currently outdated and not clear if they are accurate.  Recent GLUCOSE readings at home ranges from 76-192 with only a couple of relatively high readings.  Not clear what time of the day he is checking since that time is not programmed but he thinks he is checking at different times        Side effects from medications have been: None  Compliance with the medical regimen:  Hypoglycemia:   none recently  Glucose monitoring:  done 1-2 times a day         Glucometer: Freestyle       Blood Glucose  readings by time of day and averages from home record as above  Self-care: The diet that the patient has been following is: tries to limit foods and drinks which sugar .      Typical meal intake: Breakfast is sausage, eggs and cheese, usually has a sandwich at lunch.  Snacks are sugar free cookies and Crackers               Dietician visit, most recent: None, previously has seen dietitian with diabetes teaching programs               Exercise:  none   Weight history: Previous range 210-235  Wt Readings from Last 3 Encounters:  05/14/16 227 lb (103 kg)  04/16/16 220 lb (99.8 kg)  01/27/16 218 lb 5 oz (99 kg)    Glycemic control:   Lab Results  Component Value Date   HGBA1C 7.2 (H) 07/07/2015   HGBA1C 9.5 (H) 08/11/2014   Lab Results  Component Value Date   CREATININE 0.94 05/11/2016   No results found for: [facility]  Lab Results  Component Value Date   FRUCTOSAMINE 278 05/11/2016      Allergies as of 05/14/2016      Reactions   Contrast Media [iodinated Diagnostic Agents]  Hives, Swelling   Diazepam Other (See Comments)   Patient does not want to use   Aspirin Other (See Comments)   GI upset   Fentanyl    Pt wishes not to have   Morphine And Related Other (See Comments)   Tremors    Oxycontin [oxycodone Hcl] Other (See Comments)   "pt does not wish to have"   Shellfish Allergy Hives   All fish and shellfish w/ iodine      Medication List       Accurate as of 05/14/16  5:01 PM. Always use your most recent med list.          cyclobenzaprine 5 MG tablet Commonly known as:  FLEXERIL TAKE 1 TABLET BY MOUTH 3 TIMES DAILY TO relax muscles   gabapentin 300 MG capsule Commonly known as:  NEURONTIN Take by mouth.   HYDROcodone-acetaminophen 5-325 MG tablet Commonly known as:  NORCO Take 1 tablet by mouth every 6 (six) hours as needed for moderate pain.   lamoTRIgine 200 MG tablet Commonly known as:  LAMICTAL Take 200 mg by mouth at bedtime.     losartan 25 MG tablet Commonly known as:  COZAAR Take 25 mg by mouth at bedtime.   metFORMIN 500 MG tablet Commonly known as:  GLUCOPHAGE Take 500 mg by mouth 2 (two) times daily.   metoprolol tartrate 25 MG tablet Commonly known as:  LOPRESSOR Take 1 tablet (25 mg total) by mouth 2 (two) times daily.   mirtazapine 30 MG tablet Commonly known as:  REMERON Take 30 mg by mouth at bedtime.   multivitamin with minerals Tabs tablet Take 1 tablet by mouth daily.   NASACORT ALLERGY 24HR 55 MCG/ACT Aero nasal inhaler Generic drug:  triamcinolone Place 2 sprays into the nose at bedtime as needed (For allergies.).   PROBIOTIC FORMULA Caps Take by mouth daily.   rosuvastatin 10 MG tablet Commonly known as:  CRESTOR Take 10 mg by mouth every morning.   sitaGLIPtin 100 MG tablet Commonly known as:  JANUVIA Take 1 tablet (100 mg total) by mouth daily.   temazepam 15 MG capsule Commonly known as:  RESTORIL Take 15 mg by mouth at bedtime as needed for sleep.   venlafaxine XR 150 MG 24 hr capsule Commonly known as:  EFFEXOR-XR Take 300 mg by mouth every morning.   VITAMIN D PO Take 1 tablet by mouth daily.       Allergies:  Allergies  Allergen Reactions  . Contrast Media [Iodinated Diagnostic Agents] Hives and Swelling  . Diazepam Other (See Comments)    Patient does not want to use  . Aspirin Other (See Comments)    GI upset  . Fentanyl     Pt wishes not to have  . Morphine And Related Other (See Comments)    Tremors   . Oxycontin [Oxycodone Hcl] Other (See Comments)    "pt does not wish to have"  . Shellfish Allergy Hives    All fish and shellfish w/ iodine    Past Medical History:  Diagnosis Date  . Anxiety   . Benign localized prostatic hyperplasia with lower urinary tract symptoms (LUTS)   . Bipolar 1 disorder (HCC)   . Complication of anesthesia    limited neck motion due to cervical spine surgery  . Depression   . ED (erectile dysfunction) of organic  origin   . History of penile cancer    s/p  excision of cancer 2013  . HOH (hard of  hearing)    left ear  . Hypertension   . Irritable bowel    intermittant diarrhea  secondary to antibiotics  . Legally blind in left eye, as defined in [place]   . Stricture, prostate    urethra  . Type 2 diabetes mellitus (HCC)   . Weak urinary stream    intermittant    Past Surgical History:  Procedure Laterality Date  . CATARACT EXTRACTION W/ INTRAOCULAR LENS  IMPLANT, BILATERAL  2007  . CERVICAL FUSION  05/ 2016   [facility]   C2 -- C4  . CHOLECYSTECTOMY  2013  approx  . EXCISION PENILE CANCER  2013  . NASAL SEPTUM SURGERY  x2  last one 1990's  . SHOULDER ARTHROSCOPY WITH ROTATOR CUFF REPAIR Left 1990's  . TOTAL KNEE ARTHROPLASTY Bilateral left 2007/  right and revision in 2009  . TRANSURETHRAL INCISION OF PROSTATE N/A 11/21/2015   Procedure: TRANSURETHRAL INCISION OF THE PROSTATE (TUIP);  Surgeon: Hildred Laser, MD;  [facility];  Service: Urology;  Laterality: N/A;  . TRANSURETHRAL RESECTION OF PROSTATE N/A 07/22/2015   Procedure: TRANSURETHRAL RESECTION OF THE PROSTATE ;  Surgeon: Hildred Laser, MD;  Location: [facility];  Service: Urology;  Laterality: N/A;  . VIDEO ASSISTED THORACOSCOPY (VATS)/THOROCOTOMY Left 08/12/2014   Procedure: VIDEO ASSISTED THORACOSCOPY (VATS)/THOROCOTOMY, DRAINAGE OF EMPYEMA;  Surgeon: Alleen Borne, MD;  Location: [facility];  Service: Thoracic;  Laterality: Left;    Family History  Problem Relation Age of Onset  . Diabetes Cousin     Social History:  reports that he has been smoking Cigarettes.  He has a 53.00 pack-year smoking history. He has never used smokeless tobacco. He reports that he does not drink alcohol or use drugs.   Review of Systems   SODIUM is 130, previously not low.  He says he is having water by his side all the time  Lipid history:  Treated with Crestor, last LDL was 42   No results found for: CHOL, HDL, LDLCALC,  LDLDIRECT, TRIG, CHOLHDL         Hypertension: Has had mild hypertension, taking only low-dose losartan, followed by PCP No complaints of lightheadedness  Most recent eye exam was 12/18  Most recent foot exam: 2/18  He was complaining of significant numbness and abnormal sensations in his feet but he thinks these symptoms are not improved with improved blood sugars   LABS:  Lab on 05/11/2016  Component Date Value Ref Range Status  . Sodium 05/11/2016 130* 135 - 145 mEq/L Final  . Potassium 05/11/2016 4.5  3.5 - 5.1 mEq/L Final  . Chloride 05/11/2016 100  96 - 112 mEq/L Final  . CO2 05/11/2016 27  19 - 32 mEq/L Final  . Glucose, Bld 05/11/2016 168* 70 - 99 mg/dL Final  . BUN 29/56/2130 13  6 - 23 mg/dL Final  . Creatinine, Ser 05/11/2016 0.94  0.40 - 1.50 mg/dL Final  . Calcium 86/57/8469 10.5  8.4 - 10.5 mg/dL Final  . GFR 62/95/2841 83.95  >60.00 mL/min Final  . Fructosamine 05/11/2016 278  0 - 285 umol/L Final   Comment: Published reference interval for apparently healthy subjects between age 104 and 63 is 89 - 285 umol/L and in a poorly controlled diabetic population is 228 - 563 umol/L with a mean of 396 umol/L.     Physical Examination:  BP (!) 98/54   Pulse 72   Ht 5\' 11"  (1.803 m)   Wt 227 lb (  103 kg)   SpO2 97%   BMI 31.66 kg/m        ASSESSMENT:  Diabetes type 2, Long-standing with BMI 31  See history of present illness for detailed discussion of current diabetes management, blood sugar patterns and problems identified  His blood sugars are overall improving with using Januvia instead of glipizide Also he probably has started improving his diet also No hypoglycemia with glipizide being stopped, this may be reducing his tendency to have rebound in the evenings Currently not able to exercise and is gaining weight   Complications of diabetes: Peripheral neuropathy with mild sensory loss, symptoms appear to be better  HYPONATREMIA: His sodium is 130,  not on any thiazide diuretics However since he is drinking water constantly sodium may be low because of polydipsia  PLAN:     Since his insurance company will probably prefer Accu-[employer] he was given a new meter and he will verify that this is covered  Continue same dose of Januvia and metformin  More consistent monitoring after meals  Restart exercise when able to  Discussed needing to be more consistent with low fat intake, again he is not interested in seeing the dietitian but will discuss this again if he continues to gain weight  Also may consider a GLP-1 drug if he has weight gain  Cut back on fluid intake especially water by at least one third  A1c on the next visit  Follow-up with PCP for blood pressure regulation  Patient Instructions  Need to get new strips  Cut water intake 30%  Check blood sugars on waking up  2-3x weekly  Also check blood sugars about 2 hours after a meal and do this after different meals by rotation  Recommended blood sugar levels on waking up is 90-130 and about 2 hours after meal is 130-160  Please bring your blood sugar monitor to each visit, thank you    Counseling time on subjects discussed above is over 50% of today's 25 minute visit   Charlton Boule 05/14/2016, 5:01 PM   Note: This office note was prepared with Insurance underwriter. Any transcriptional errors that result from this process are unintentional.

## 2016-05-14 NOTE — Patient Instructions (Signed)
Need to get new strips  Cut water intake 30%  Check blood sugars on waking up  2-3x weekly  Also check blood sugars about 2 hours after a meal and do this after different meals by rotation  Recommended blood sugar levels on waking up is 90-130 and about 2 hours after meal is 130-160  Please bring your blood sugar monitor to each visit, thank you

## 2016-06-11 ENCOUNTER — Other Ambulatory Visit: Payer: Self-pay | Admitting: Endocrinology

## 2016-06-17 ENCOUNTER — Emergency Department (HOSPITAL_COMMUNITY)
Admission: EM | Admit: 2016-06-17 | Discharge: 2016-06-17 | Disposition: A | Discharge status: Home / Self Care | Payer: Medicare PPO | Attending: Emergency Medicine | Admitting: Emergency Medicine

## 2016-06-17 ENCOUNTER — Encounter (HOSPITAL_COMMUNITY): Payer: Self-pay | Admitting: Emergency Medicine

## 2016-06-17 DIAGNOSIS — Z5321 Procedure and treatment not carried out due to patient leaving prior to being seen by health care provider: Secondary | ICD-10-CM | POA: Diagnosis not present

## 2016-06-17 DIAGNOSIS — R109 Unspecified abdominal pain: Secondary | ICD-10-CM | POA: Insufficient documentation

## 2016-06-17 DIAGNOSIS — R339 Retention of urine, unspecified: Secondary | ICD-10-CM | POA: Insufficient documentation

## 2016-06-17 NOTE — ED Notes (Signed)
Pt attempted to give urine sample with no success. Given specimen cup and sent to lobby.

## 2016-06-17 NOTE — ED Triage Notes (Signed)
Pt c/o urinary retention/ bladder pressure intermittently for past week. Pt also having right sided flank pain, unable to lay on right side. Thought pain was musculoskeletal and saw ortho. Pt reports trickling stream, unable to get a stream of urine started.

## 2016-06-21 ENCOUNTER — Other Ambulatory Visit: Payer: Medicare PPO

## 2016-06-25 ENCOUNTER — Ambulatory Visit: Payer: Medicare PPO | Admitting: Endocrinology

## 2016-07-02 ENCOUNTER — Telehealth: Payer: Self-pay | Admitting: Radiology

## 2016-07-02 ENCOUNTER — Other Ambulatory Visit: Payer: Self-pay | Admitting: Family Medicine

## 2016-07-02 DIAGNOSIS — R1900 Intra-abdominal and pelvic swelling, mass and lump, unspecified site: Secondary | ICD-10-CM

## 2016-07-02 NOTE — Telephone Encounter (Signed)
[facility]. Called pt's home and left message for pt to call with questions.

## 2016-07-02 NOTE — Telephone Encounter (Signed)
Mail box is full on cell phone and no answer.

## 2016-07-04 ENCOUNTER — Ambulatory Visit
Admission: RE | Admit: 2016-07-04 | Discharge: 2016-07-04 | Disposition: A | Discharge status: Home / Self Care | Payer: Medicare PPO | Source: Ambulatory Visit | Attending: Family Medicine | Admitting: Family Medicine

## 2016-07-04 DIAGNOSIS — R1900 Intra-abdominal and pelvic swelling, mass and lump, unspecified site: Secondary | ICD-10-CM

## 2016-07-04 MED ORDER — IOPAMIDOL (ISOVUE-300) INJECTION 61%
125.0000 mL | Freq: Once | INTRAVENOUS | Status: AC | PRN
  Administered 2016-07-04: 125 mL via INTRAVENOUS

## 2016-07-10 ENCOUNTER — Other Ambulatory Visit: Payer: Self-pay | Admitting: Family Medicine

## 2016-09-04 ENCOUNTER — Other Ambulatory Visit: Payer: Self-pay | Admitting: Endocrinology

## 2016-11-28 ENCOUNTER — Other Ambulatory Visit: Payer: Self-pay | Admitting: Endocrinology

## 2016-12-31 DIAGNOSIS — F3131 Bipolar disorder, current episode depressed, mild: Secondary | ICD-10-CM | POA: Diagnosis not present

## 2017-01-28 ENCOUNTER — Other Ambulatory Visit: Payer: Self-pay | Admitting: Endocrinology

## 2017-01-30 DIAGNOSIS — E119 Type 2 diabetes mellitus without complications: Secondary | ICD-10-CM | POA: Diagnosis not present

## 2017-01-30 DIAGNOSIS — G479 Sleep disorder, unspecified: Secondary | ICD-10-CM | POA: Diagnosis not present

## 2017-01-30 DIAGNOSIS — M62838 Other muscle spasm: Secondary | ICD-10-CM | POA: Diagnosis not present

## 2017-01-30 DIAGNOSIS — Z7984 Long term (current) use of oral hypoglycemic drugs: Secondary | ICD-10-CM | POA: Diagnosis not present

## 2017-01-30 DIAGNOSIS — F3171 Bipolar disorder, in partial remission, most recent episode hypomanic: Secondary | ICD-10-CM | POA: Diagnosis not present

## 2017-02-27 ENCOUNTER — Other Ambulatory Visit: Payer: Self-pay | Admitting: Endocrinology

## 2017-03-27 ENCOUNTER — Other Ambulatory Visit: Payer: Self-pay | Admitting: Endocrinology

## 2017-04-01 ENCOUNTER — Other Ambulatory Visit: Payer: Self-pay | Admitting: Endocrinology

## 2017-04-09 DIAGNOSIS — M25551 Pain in right hip: Secondary | ICD-10-CM | POA: Diagnosis not present

## 2017-04-13 DIAGNOSIS — M25551 Pain in right hip: Secondary | ICD-10-CM | POA: Diagnosis not present

## 2017-04-17 DIAGNOSIS — M1612 Unilateral primary osteoarthritis, left hip: Secondary | ICD-10-CM | POA: Diagnosis not present

## 2017-04-26 DIAGNOSIS — M1612 Unilateral primary osteoarthritis, left hip: Secondary | ICD-10-CM | POA: Diagnosis not present

## 2017-04-26 DIAGNOSIS — S73191S Other sprain of right hip, sequela: Secondary | ICD-10-CM | POA: Diagnosis not present

## 2017-04-26 DIAGNOSIS — F319 Bipolar disorder, unspecified: Secondary | ICD-10-CM | POA: Diagnosis not present

## 2017-04-26 DIAGNOSIS — E119 Type 2 diabetes mellitus without complications: Secondary | ICD-10-CM | POA: Diagnosis not present

## 2017-04-26 DIAGNOSIS — M25552 Pain in left hip: Secondary | ICD-10-CM | POA: Diagnosis not present

## 2017-04-26 DIAGNOSIS — E785 Hyperlipidemia, unspecified: Secondary | ICD-10-CM | POA: Diagnosis not present

## 2017-04-26 DIAGNOSIS — E782 Mixed hyperlipidemia: Secondary | ICD-10-CM | POA: Diagnosis not present

## 2017-04-26 DIAGNOSIS — S73101D Unspecified sprain of right hip, subsequent encounter: Secondary | ICD-10-CM | POA: Diagnosis not present

## 2017-04-26 DIAGNOSIS — Z5181 Encounter for therapeutic drug level monitoring: Secondary | ICD-10-CM | POA: Diagnosis not present

## 2017-04-26 DIAGNOSIS — I10 Essential (primary) hypertension: Secondary | ICD-10-CM | POA: Diagnosis not present

## 2017-04-26 DIAGNOSIS — M25551 Pain in right hip: Secondary | ICD-10-CM | POA: Diagnosis not present

## 2017-04-26 DIAGNOSIS — M1990 Unspecified osteoarthritis, unspecified site: Secondary | ICD-10-CM | POA: Diagnosis not present

## 2017-05-13 DIAGNOSIS — M25561 Pain in right knee: Secondary | ICD-10-CM | POA: Diagnosis not present

## 2017-05-13 DIAGNOSIS — G8929 Other chronic pain: Secondary | ICD-10-CM | POA: Diagnosis not present

## 2017-05-13 DIAGNOSIS — Z96651 Presence of right artificial knee joint: Secondary | ICD-10-CM | POA: Diagnosis not present

## 2017-05-13 DIAGNOSIS — Z96652 Presence of left artificial knee joint: Secondary | ICD-10-CM | POA: Diagnosis not present

## 2017-05-13 DIAGNOSIS — M1611 Unilateral primary osteoarthritis, right hip: Secondary | ICD-10-CM | POA: Diagnosis not present

## 2017-05-13 DIAGNOSIS — M25551 Pain in right hip: Secondary | ICD-10-CM | POA: Diagnosis not present

## 2017-05-17 DIAGNOSIS — M1611 Unilateral primary osteoarthritis, right hip: Secondary | ICD-10-CM | POA: Diagnosis not present

## 2017-05-27 ENCOUNTER — Other Ambulatory Visit: Payer: Self-pay | Admitting: Endocrinology

## 2017-05-27 ENCOUNTER — Telehealth: Payer: Self-pay | Admitting: Endocrinology

## 2017-05-27 NOTE — Telephone Encounter (Signed)
Refuse refill with note to make appointment 

## 2017-05-27 NOTE — Telephone Encounter (Signed)
Pt last ov 05/14/16 2 cancelled and no future. Requesting refill please advise

## 2017-05-27 NOTE — Telephone Encounter (Signed)
Need refill of  Original Order:  JANUVIA 100 MG tablet [[id number]][[id number]]    [facility]  [facility] [place] - [phone] [facility]  --     Please advise

## 2017-05-28 ENCOUNTER — Other Ambulatory Visit: Payer: Self-pay

## 2017-05-28 ENCOUNTER — Other Ambulatory Visit: Payer: Self-pay | Admitting: Endocrinology

## 2017-05-28 DIAGNOSIS — E1165 Type 2 diabetes mellitus with hyperglycemia: Secondary | ICD-10-CM

## 2017-05-28 MED ORDER — SITAGLIPTIN PHOSPHATE 100 MG PO TABS: 100.0000 mg | ORAL_TABLET | Freq: Every day | ORAL | 3 refills | Status: DC

## 2017-05-28 NOTE — Telephone Encounter (Signed)
Please schedule pt

## 2017-05-28 NOTE — Telephone Encounter (Signed)
He will need to have labs before he comes in to see me

## 2017-05-28 NOTE — Telephone Encounter (Signed)
This has been done.

## 2017-05-29 ENCOUNTER — Other Ambulatory Visit: Payer: Medicare PPO

## 2017-05-29 DIAGNOSIS — E1165 Type 2 diabetes mellitus with hyperglycemia: Secondary | ICD-10-CM

## 2017-05-29 NOTE — Telephone Encounter (Signed)
LVM requesting patient call us to get on our lab schedule or go to [facility]

## 2017-05-31 ENCOUNTER — Ambulatory Visit: Payer: Medicare PPO | Admitting: Endocrinology

## 2017-05-31 ENCOUNTER — Other Ambulatory Visit (INDEPENDENT_AMBULATORY_CARE_PROVIDER_SITE_OTHER): Payer: Medicare PPO

## 2017-05-31 DIAGNOSIS — E1165 Type 2 diabetes mellitus with hyperglycemia: Secondary | ICD-10-CM | POA: Diagnosis not present

## 2017-05-31 LAB — COMPREHENSIVE METABOLIC PANEL
ALK PHOS: 62 U/L (ref 39–117)
ALT: 32 U/L (ref 0–53)
AST: 25 U/L (ref 0–37)
Albumin: 4.4 g/dL (ref 3.5–5.2)
BUN: 10 mg/dL (ref 6–23)
CALCIUM: 9.9 mg/dL (ref 8.4–10.5)
CO2: 27 mEq/L (ref 19–32)
Chloride: 103 mEq/L (ref 96–112)
Creatinine, Ser: 1.03 mg/dL (ref 0.40–1.50)
GFR: 75.32 mL/min (ref >60.00)
GLUCOSE: 118 mg/dL — AB (ref 70–99)
POTASSIUM: 4.9 meq/L (ref 3.5–5.1)
Sodium: 136 mEq/L (ref 135–145)
TOTAL PROTEIN: 7.2 g/dL (ref 6.0–8.3)
Total Bilirubin: 0.4 mg/dL (ref 0.2–1.2)

## 2017-05-31 LAB — LIPID PANEL
Cholesterol: 126 mg/dL (ref 0–200)
HDL: 40.2 mg/dL (ref >39.00)
LDL Cholesterol: 47 mg/dL (ref 0–99)
NONHDL: 85.39
TRIGLYCERIDES: 190 mg/dL — AB (ref 0.0–149.0)
Total CHOL/HDL Ratio: 3
VLDL: 38 mg/dL (ref 0.0–40.0)

## 2017-05-31 LAB — MICROALBUMIN / CREATININE URINE RATIO
Creatinine,U: 68.8 mg/dL
MICROALB/CREAT RATIO: 22.6 mg/g (ref 0.0–30.0)
Microalb, Ur: 15.6 mg/dL — ABNORMAL HIGH (ref 0.0–1.9)

## 2017-05-31 LAB — HEMOGLOBIN A1C: Hgb A1c MFr Bld: 6.6 % — ABNORMAL HIGH (ref 4.6–6.5)

## 2017-06-03 DIAGNOSIS — M1611 Unilateral primary osteoarthritis, right hip: Secondary | ICD-10-CM | POA: Diagnosis not present

## 2017-06-05 ENCOUNTER — Encounter: Payer: Self-pay | Admitting: Endocrinology

## 2017-06-05 ENCOUNTER — Ambulatory Visit: Payer: Medicare PPO | Admitting: Endocrinology

## 2017-06-05 VITALS — BP 136/84 | HR 86 | Ht 71.0 in | Wt 213.8 lb

## 2017-06-05 DIAGNOSIS — E1165 Type 2 diabetes mellitus with hyperglycemia: Secondary | ICD-10-CM | POA: Diagnosis not present

## 2017-06-05 LAB — GLUCOSE, POCT (MANUAL RESULT ENTRY): POC Glucose: 198 mg/dl — AB (ref 70–99)

## 2017-06-05 MED ORDER — GLIPIZIDE ER 5 MG PO TB24: 5.0000 mg | ORAL_TABLET | Freq: Every day | ORAL | 2 refills | Status: DC

## 2017-06-05 NOTE — Patient Instructions (Addendum)
Check blood sugars on waking up  2-3/7  Also check blood sugars about 2 hours after a meal and do this after different meals by rotation  Recommended blood sugar levels on waking up is 90-130 and about 2 hours after meal is 130-160  Please bring your blood sugar monitor to each visit, thank you   

## 2017-06-05 NOTE — Progress Notes (Signed)
Patient ID: Dwayne Vargas, male   DOB: Jul 03, 1945, 72 y.o.   MRN: [id number]            Reason for Appointment:  Follow-up for Type 2 Diabetes  Referring physician: Henrine Screws   History of Present Illness:          Date of diagnosis of type 2 diabetes mellitus: 2013       Background history:   He apparently had mild diabetes at onset and was treated with metformin alone, he thinks for most of the duration of his diabetes he was on metformin once a day, 500 mg and subsequently twice a day Not clear if his blood sugars have been adequately controlled in the past, no records available  Recent history: :       Non-insulin hypoglycemic drugs the patient is taking are: Januvia 100 mg, metformin 500 mg twice a day  He has not been seen in follow-up since 04/2016  His A1c is relatively better at 6.6 compared to 7.2   Current management, blood sugar patterns and problems identified:  He was told to stop glipizide when he was started on Januvia in 2018 but appears that he is gone back on glipizide from a previous prescription  He was given an Accu-Chek meter to use last year and since he did not know how to use this he did not start checking his blood sugars and did not call for assistance  Although he is taking glipizide only in the morning he does not appear to have overall high readings in the afternoon lab glucose was 118  Today however his blood sugar was 198 without any food or glipizide.  He had fried food last night and 3 cups of coffee this morning  Since last year he has lost significant amount of weight from cutting back on portions and improving his diet in general  However because of back pain and hip pain he has not been active at all and is waiting for surgery  He is completely avoiding drinks with sugar but periodically eating out        Side effects from medications have been: None  Compliance with the medical regimen: Inconsistent Hypoglycemia:   none  recently  Glucose monitoring:  done 0 times a day         Glucometer: Accu-Chek       Blood Glucose readings none  Self-care: The diet that the patient has been following is: tries to limit foods and drinks which contain sugar .      Typical meal intake: Breakfast is sausage, eggs and cheese, usually has a sandwich at lunch.  Snacks are sugar free cookies and Crackers               Dietician visit, most recent: None, previously has seen dietitian with diabetes teaching programs               Exercise:  none   Weight history: Previous range 210-235  Wt Readings from Last 3 Encounters:  06/05/17 213 lb 12.8 oz (97 kg)  05/14/16 227 lb (103 kg)  04/16/16 220 lb (99.8 kg)    Glycemic control:   Lab Results  Component Value Date   HGBA1C 6.6 (H) 05/31/2017   HGBA1C 7.2 (H) 07/07/2015   HGBA1C 9.5 (H) 08/11/2014   Lab Results  Component Value Date   MICROALBUR 15.6 (H) 05/31/2017   LDLCALC 47 05/31/2017   CREATININE 1.03 05/31/2017   Lab Results  Component  Value Date   MICRALBCREAT 22.6 05/31/2017    Lab Results  Component Value Date   FRUCTOSAMINE 278 05/11/2016      Allergies as of 06/05/2017      Reactions   Aspirin Other (See Comments)   GI upset   Contrast Media [iodinated Diagnostic Agents] Hives, Swelling   Fentanyl    Pt wishes not to have   Morphine And Related Other (See Comments)   Tremors    Oxycontin [oxycodone Hcl] Other (See Comments)   "pt does not wish to have"   Shellfish Allergy Hives   All fish and shellfish w/ iodine   Diazepam Other (See Comments)   Patient does not want to use      Medication List        Accurate as of 06/05/17 11:16 AM. Always use your most recent med list.          gabapentin 300 MG capsule Commonly known as:  NEURONTIN Take by mouth.   glipiZIDE 5 MG 24 hr tablet Commonly known as:  GLUCOTROL XL Take 1 tablet (5 mg total) by mouth daily with breakfast.   lamoTRIgine 200 MG tablet Commonly known as:   LAMICTAL Take 200 mg by mouth at bedtime.   losartan 25 MG tablet Commonly known as:  COZAAR Take 25 mg by mouth at bedtime.   metFORMIN 500 MG tablet Commonly known as:  GLUCOPHAGE Take 500 mg by mouth 2 (two) times daily.   metoprolol tartrate 25 MG tablet Commonly known as:  LOPRESSOR Take 1 tablet (25 mg total) by mouth 2 (two) times daily.   mirtazapine 30 MG tablet Commonly known as:  REMERON Take 30 mg by mouth at bedtime.   multivitamin with minerals Tabs tablet Take 1 tablet by mouth daily.   NASACORT ALLERGY 24HR 55 MCG/ACT Aero nasal inhaler Generic drug:  triamcinolone Place 2 sprays into the nose at bedtime as needed (For allergies.).   rosuvastatin 10 MG tablet Commonly known as:  CRESTOR Take 10 mg by mouth every morning.   sitaGLIPtin 100 MG tablet Commonly known as:  JANUVIA Take 1 tablet (100 mg total) by mouth daily.   venlafaxine XR 150 MG 24 hr capsule Commonly known as:  EFFEXOR-XR Take 300 mg by mouth every morning.       Allergies:  Allergies  Allergen Reactions  . Aspirin Other (See Comments)    GI upset  . Contrast Media [Iodinated Diagnostic Agents] Hives and Swelling  . Fentanyl     Pt wishes not to have  . Morphine And Related Other (See Comments)    Tremors   . Oxycontin [Oxycodone Hcl] Other (See Comments)    "pt does not wish to have"  . Shellfish Allergy Hives    All fish and shellfish w/ iodine  . Diazepam Other (See Comments)    Patient does not want to use    Past Medical History:  Diagnosis Date  . Anxiety   . Benign localized prostatic hyperplasia with lower urinary tract symptoms (LUTS)   . Bipolar 1 disorder (HCC)   . Complication of anesthesia    limited neck motion due to cervical spine surgery  . Depression   . ED (erectile dysfunction) of organic origin   . History of penile cancer    s/p  excision of cancer 2013  . HOH (hard of hearing)    left ear  . Hypertension   . Irritable bowel    intermittant  diarrhea  secondary to  antibiotics  . Legally blind in left eye, as defined in [place]   . Stricture, prostate    urethra  . Type 2 diabetes mellitus (HCC)   . Weak urinary stream    intermittant    Past Surgical History:  Procedure Laterality Date  . CATARACT EXTRACTION W/ INTRAOCULAR LENS  IMPLANT, BILATERAL  2007  . CERVICAL FUSION  05/ 2016   [facility]   C2 -- C4  . CHOLECYSTECTOMY  2013  approx  . EXCISION PENILE CANCER  2013  . NASAL SEPTUM SURGERY  x2  last one 1990's  . SHOULDER ARTHROSCOPY WITH ROTATOR CUFF REPAIR Left 1990's  . TOTAL KNEE ARTHROPLASTY Bilateral left 2007/  right and revision in 2009  . TRANSURETHRAL INCISION OF PROSTATE N/A 11/21/2015   Procedure: TRANSURETHRAL INCISION OF THE PROSTATE (TUIP);  Surgeon: Hildred Laser, MD;  Location: [facility];  Service: Urology;  Laterality: N/A;  . TRANSURETHRAL RESECTION OF PROSTATE N/A 07/22/2015   Procedure: TRANSURETHRAL RESECTION OF THE PROSTATE ;  Surgeon: Hildred Laser, MD;  Location: [facility];  Service: Urology;  Laterality: N/A;  . VIDEO ASSISTED THORACOSCOPY (VATS)/THOROCOTOMY Left 08/12/2014   Procedure: VIDEO ASSISTED THORACOSCOPY (VATS)/THOROCOTOMY, DRAINAGE OF EMPYEMA;  Surgeon: Alleen Borne, MD;  Location: [facility];  Service: Thoracic;  Laterality: Left;    Family History  Problem Relation Age of Onset  . Diabetes Cousin     Social History:  reports that he has been smoking cigarettes.  He has a 53.00 pack-year smoking history. He has never used smokeless tobacco. He reports that he does not drink alcohol or use drugs.   Review of Systems   Lipid history:  Treated adequately with Crestor, last lipid panel was nonfasting    Lab Results  Component Value Date   CHOL 126 05/31/2017   HDL 40.20 05/31/2017   LDLCALC 47 05/31/2017   TRIG 190.0 (H) 05/31/2017   CHOLHDL 3 05/31/2017           Hypertension: Has had mild hypertension, taking only low-dose losartan, followed by PCP No  complaints of lightheadedness  Most recent eye exam was 12/18  Most recent foot exam: 2/18  Currently having pain down the right leg and some associated numbness on the right side   Complications of diabetes: Peripheral neuropathy with mild sensory loss, less consistent symptoms now  LABS:  Office Visit on 06/05/2017  Component Date Value Ref Range Status  . POC Glucose 06/05/2017 198* 70 - 99 mg/dl Final  Lab on 11/91/4782  Component Date Value Ref Range Status  . Microalb, Ur 05/31/2017 15.6* 0.0 - 1.9 mg/dL Final  . Creatinine,U 95/62/1308 68.8  mg/dL Final  . Microalb Creat Ratio 05/31/2017 22.6  0.0 - 30.0 mg/g Final  . Cholesterol 05/31/2017 126  0 - 200 mg/dL Final   ATP III Classification       Desirable:  < 200 mg/dL               Borderline High:  200 - 239 mg/dL          High:  > = 657 mg/dL  . Triglycerides 05/31/2017 190.0* 0.0 - 149.0 mg/dL Final   Normal:  <846 mg/dLBorderline High:  150 - 199 mg/dL  . HDL 05/31/2017 40.20  >39.00 mg/dL Final  . VLDL 96/29/5284 38.0  0.0 - 40.0 mg/dL Final  . LDL Cholesterol 05/31/2017 47  0 - 99 mg/dL Final  . Total CHOL/HDL Ratio 05/31/2017 3   Final  Men          Women1/2 Average Risk     3.4          3.3Average Risk          5.0          4.42X Average Risk          9.6          7.13X Average Risk          15.0          11.0                      . NonHDL 05/31/2017 85.39   Final   NOTE:  Non-HDL goal should be 30 mg/dL higher than patient's LDL goal (i.e. LDL goal of < 70 mg/dL, would have non-HDL goal of < 100 mg/dL)  . Sodium 05/31/2017 136  135 - 145 mEq/L Final  . Potassium 05/31/2017 4.9  3.5 - 5.1 mEq/L Final  . Chloride 05/31/2017 103  96 - 112 mEq/L Final  . CO2 05/31/2017 27  19 - 32 mEq/L Final  . Glucose, Bld 05/31/2017 118* 70 - 99 mg/dL Final  . BUN 16/11/9602 10  6 - 23 mg/dL Final  . Creatinine, Ser 05/31/2017 1.03  0.40 - 1.50 mg/dL Final  . Total Bilirubin 05/31/2017 0.4  0.2 - 1.2 mg/dL Final   . Alkaline Phosphatase 05/31/2017 62  39 - 117 U/L Final  . AST 05/31/2017 25  0 - 37 U/L Final  . ALT 05/31/2017 32  0 - 53 U/L Final  . Total Protein 05/31/2017 7.2  6.0 - 8.3 g/dL Final  . Albumin 54/10/8117 4.4  3.5 - 5.2 g/dL Final  . Calcium 14/78/2956 9.9  8.4 - 10.5 mg/dL Final  . GFR 21/30/8657 75.32  >60.00 mL/min Final  . Hgb A1c MFr Bld 05/31/2017 6.6* 4.6 - 6.5 % Final   Glycemic Control Guidelines for People with Diabetes:Non Diabetic:  <6%Goal of Therapy: <7%Additional Action Suggested:  >8%     Physical Examination:  BP 136/84 (BP Location: Left Arm, Patient Position: Sitting, Cuff Size: Normal)   Pulse 86   Ht 5\' 11"  (1.803 m)   Wt 213 lb 12.8 oz (97 kg)   SpO2 98%   BMI 29.82 kg/m        ASSESSMENT:  Diabetes type 2, Long-standing not on insulin  See history of present illness for detailed discussion of current diabetes management, blood sugar patterns and problems identified  His blood sugars are better controlled than last year This is however with his taking glipizide on his own in addition to his Januvia and metformin However he has improved his diet significantly and has lost weight since last year Currently not monitoring blood sugar Not exercising at this time Discussed with patient that his high sugar this morning in the office is likely to be from getting fried food last night   Hypertension: Followed by PCP   PLAN:    We will have him continue glipizide but change to the extended release version to have consistent 24 control  More consistent restriction of high fat foods  He will bring his monitor so that he can be shown how to use this  To check blood sugars alternating fasting and after meals  Continue metformin and Januvia  Review blood sugar patterns in 6 weeks  Patient Instructions  Check blood sugars on waking up  2-3/7   Also check  blood sugars about 2 hours after a meal and do this after different meals by  rotation  Recommended blood sugar levels on waking up is 90-130 and about 2 hours after meal is 130-160  Please bring your blood sugar monitor to each visit, thank you        Reather LittlerAjay Jaymarie Yeakel 06/05/2017, 11:16 AM   Note: This office note was prepared with Dragon voice recognition system technology. Any transcriptional errors that result from this process are unintentional.

## 2017-07-03 DIAGNOSIS — S161XXD Strain of muscle, fascia and tendon at neck level, subsequent encounter: Secondary | ICD-10-CM | POA: Diagnosis not present

## 2017-07-03 DIAGNOSIS — M542 Cervicalgia: Secondary | ICD-10-CM | POA: Diagnosis not present

## 2017-07-04 DIAGNOSIS — D485 Neoplasm of uncertain behavior of skin: Secondary | ICD-10-CM | POA: Diagnosis not present

## 2017-07-04 DIAGNOSIS — L439 Lichen planus, unspecified: Secondary | ICD-10-CM | POA: Diagnosis not present

## 2017-07-08 DIAGNOSIS — M1611 Unilateral primary osteoarthritis, right hip: Secondary | ICD-10-CM | POA: Diagnosis not present

## 2017-07-08 DIAGNOSIS — Z96652 Presence of left artificial knee joint: Secondary | ICD-10-CM | POA: Diagnosis not present

## 2017-07-08 DIAGNOSIS — Z96651 Presence of right artificial knee joint: Secondary | ICD-10-CM | POA: Diagnosis not present

## 2017-07-08 DIAGNOSIS — Z96653 Presence of artificial knee joint, bilateral: Secondary | ICD-10-CM | POA: Diagnosis not present

## 2017-07-08 DIAGNOSIS — M25551 Pain in right hip: Secondary | ICD-10-CM | POA: Diagnosis not present

## 2017-07-15 DIAGNOSIS — M545 Low back pain: Secondary | ICD-10-CM | POA: Diagnosis not present

## 2017-07-15 DIAGNOSIS — G8929 Other chronic pain: Secondary | ICD-10-CM | POA: Diagnosis not present

## 2017-07-16 ENCOUNTER — Other Ambulatory Visit: Payer: Medicare PPO

## 2017-07-16 DIAGNOSIS — L438 Other lichen planus: Secondary | ICD-10-CM | POA: Diagnosis not present

## 2017-07-19 ENCOUNTER — Ambulatory Visit: Payer: Medicare PPO | Admitting: Endocrinology

## 2017-07-23 ENCOUNTER — Other Ambulatory Visit: Payer: Self-pay | Admitting: Endocrinology

## 2017-07-23 DIAGNOSIS — N401 Enlarged prostate with lower urinary tract symptoms: Secondary | ICD-10-CM | POA: Diagnosis not present

## 2017-07-23 DIAGNOSIS — N5201 Erectile dysfunction due to arterial insufficiency: Secondary | ICD-10-CM | POA: Diagnosis not present

## 2017-07-23 DIAGNOSIS — R3912 Poor urinary stream: Secondary | ICD-10-CM | POA: Diagnosis not present

## 2017-07-26 DIAGNOSIS — Z7984 Long term (current) use of oral hypoglycemic drugs: Secondary | ICD-10-CM | POA: Diagnosis not present

## 2017-07-26 DIAGNOSIS — E119 Type 2 diabetes mellitus without complications: Secondary | ICD-10-CM | POA: Diagnosis not present

## 2017-07-26 DIAGNOSIS — M150 Primary generalized (osteo)arthritis: Secondary | ICD-10-CM | POA: Diagnosis not present

## 2017-07-26 DIAGNOSIS — I10 Essential (primary) hypertension: Secondary | ICD-10-CM | POA: Diagnosis not present

## 2017-08-01 DIAGNOSIS — N5201 Erectile dysfunction due to arterial insufficiency: Secondary | ICD-10-CM | POA: Diagnosis not present

## 2017-08-05 DIAGNOSIS — M1611 Unilateral primary osteoarthritis, right hip: Secondary | ICD-10-CM | POA: Diagnosis not present

## 2017-08-05 DIAGNOSIS — E119 Type 2 diabetes mellitus without complications: Secondary | ICD-10-CM | POA: Diagnosis not present

## 2017-08-05 DIAGNOSIS — R001 Bradycardia, unspecified: Secondary | ICD-10-CM | POA: Diagnosis not present

## 2017-08-05 DIAGNOSIS — F319 Bipolar disorder, unspecified: Secondary | ICD-10-CM | POA: Diagnosis not present

## 2017-08-05 DIAGNOSIS — I10 Essential (primary) hypertension: Secondary | ICD-10-CM | POA: Diagnosis not present

## 2017-08-05 DIAGNOSIS — Z01818 Encounter for other preprocedural examination: Secondary | ICD-10-CM | POA: Diagnosis not present

## 2017-08-05 DIAGNOSIS — F419 Anxiety disorder, unspecified: Secondary | ICD-10-CM | POA: Diagnosis not present

## 2017-08-05 DIAGNOSIS — G4730 Sleep apnea, unspecified: Secondary | ICD-10-CM | POA: Diagnosis not present

## 2017-08-05 DIAGNOSIS — F329 Major depressive disorder, single episode, unspecified: Secondary | ICD-10-CM | POA: Diagnosis not present

## 2017-08-05 DIAGNOSIS — G8929 Other chronic pain: Secondary | ICD-10-CM | POA: Diagnosis not present

## 2017-08-21 DIAGNOSIS — M5000 Cervical disc disorder with myelopathy, unspecified cervical region: Secondary | ICD-10-CM | POA: Diagnosis not present

## 2017-08-21 DIAGNOSIS — Z471 Aftercare following joint replacement surgery: Secondary | ICD-10-CM | POA: Diagnosis not present

## 2017-08-21 DIAGNOSIS — F319 Bipolar disorder, unspecified: Secondary | ICD-10-CM | POA: Diagnosis not present

## 2017-08-21 DIAGNOSIS — R488 Other symbolic dysfunctions: Secondary | ICD-10-CM | POA: Diagnosis not present

## 2017-08-21 DIAGNOSIS — R001 Bradycardia, unspecified: Secondary | ICD-10-CM | POA: Diagnosis not present

## 2017-08-21 DIAGNOSIS — G2581 Restless legs syndrome: Secondary | ICD-10-CM | POA: Diagnosis not present

## 2017-08-21 DIAGNOSIS — E119 Type 2 diabetes mellitus without complications: Secondary | ICD-10-CM | POA: Diagnosis not present

## 2017-08-21 DIAGNOSIS — E785 Hyperlipidemia, unspecified: Secondary | ICD-10-CM | POA: Diagnosis not present

## 2017-08-21 DIAGNOSIS — E782 Mixed hyperlipidemia: Secondary | ICD-10-CM | POA: Diagnosis not present

## 2017-08-21 DIAGNOSIS — G8929 Other chronic pain: Secondary | ICD-10-CM | POA: Diagnosis not present

## 2017-08-21 DIAGNOSIS — E871 Hypo-osmolality and hyponatremia: Secondary | ICD-10-CM | POA: Diagnosis not present

## 2017-08-21 DIAGNOSIS — M542 Cervicalgia: Secondary | ICD-10-CM | POA: Diagnosis not present

## 2017-08-21 DIAGNOSIS — M1611 Unilateral primary osteoarthritis, right hip: Secondary | ICD-10-CM | POA: Diagnosis not present

## 2017-08-21 DIAGNOSIS — G4700 Insomnia, unspecified: Secondary | ICD-10-CM | POA: Diagnosis not present

## 2017-08-21 DIAGNOSIS — I10 Essential (primary) hypertension: Secondary | ICD-10-CM | POA: Diagnosis not present

## 2017-08-21 DIAGNOSIS — N400 Enlarged prostate without lower urinary tract symptoms: Secondary | ICD-10-CM | POA: Diagnosis not present

## 2017-08-21 DIAGNOSIS — G4733 Obstructive sleep apnea (adult) (pediatric): Secondary | ICD-10-CM | POA: Diagnosis not present

## 2017-08-21 DIAGNOSIS — M6281 Muscle weakness (generalized): Secondary | ICD-10-CM | POA: Diagnosis not present

## 2017-08-21 DIAGNOSIS — Z6830 Body mass index (BMI) 30.0-30.9, adult: Secondary | ICD-10-CM | POA: Diagnosis not present

## 2017-08-21 DIAGNOSIS — Z96641 Presence of right artificial hip joint: Secondary | ICD-10-CM | POA: Diagnosis not present

## 2017-08-21 DIAGNOSIS — F329 Major depressive disorder, single episode, unspecified: Secondary | ICD-10-CM | POA: Diagnosis not present

## 2017-08-21 DIAGNOSIS — R2689 Other abnormalities of gait and mobility: Secondary | ICD-10-CM | POA: Diagnosis not present

## 2017-08-21 DIAGNOSIS — E669 Obesity, unspecified: Secondary | ICD-10-CM | POA: Diagnosis not present

## 2017-08-21 DIAGNOSIS — F419 Anxiety disorder, unspecified: Secondary | ICD-10-CM | POA: Diagnosis not present

## 2017-08-21 DIAGNOSIS — M1990 Unspecified osteoarthritis, unspecified site: Secondary | ICD-10-CM | POA: Diagnosis not present

## 2017-08-22 ENCOUNTER — Other Ambulatory Visit: Payer: Self-pay | Admitting: Endocrinology

## 2017-08-23 DIAGNOSIS — N400 Enlarged prostate without lower urinary tract symptoms: Secondary | ICD-10-CM | POA: Diagnosis not present

## 2017-08-23 DIAGNOSIS — F39 Unspecified mood [affective] disorder: Secondary | ICD-10-CM | POA: Diagnosis not present

## 2017-08-23 DIAGNOSIS — M1990 Unspecified osteoarthritis, unspecified site: Secondary | ICD-10-CM | POA: Diagnosis not present

## 2017-08-23 DIAGNOSIS — F419 Anxiety disorder, unspecified: Secondary | ICD-10-CM | POA: Diagnosis not present

## 2017-08-23 DIAGNOSIS — F319 Bipolar disorder, unspecified: Secondary | ICD-10-CM | POA: Diagnosis not present

## 2017-08-23 DIAGNOSIS — M5000 Cervical disc disorder with myelopathy, unspecified cervical region: Secondary | ICD-10-CM | POA: Diagnosis not present

## 2017-08-23 DIAGNOSIS — R488 Other symbolic dysfunctions: Secondary | ICD-10-CM | POA: Diagnosis not present

## 2017-08-23 DIAGNOSIS — E1142 Type 2 diabetes mellitus with diabetic polyneuropathy: Secondary | ICD-10-CM | POA: Diagnosis not present

## 2017-08-23 DIAGNOSIS — I10 Essential (primary) hypertension: Secondary | ICD-10-CM | POA: Diagnosis not present

## 2017-08-23 DIAGNOSIS — Z96641 Presence of right artificial hip joint: Secondary | ICD-10-CM | POA: Diagnosis not present

## 2017-08-23 DIAGNOSIS — M6281 Muscle weakness (generalized): Secondary | ICD-10-CM | POA: Diagnosis not present

## 2017-08-23 DIAGNOSIS — F329 Major depressive disorder, single episode, unspecified: Secondary | ICD-10-CM | POA: Diagnosis not present

## 2017-08-23 DIAGNOSIS — E1169 Type 2 diabetes mellitus with other specified complication: Secondary | ICD-10-CM | POA: Diagnosis not present

## 2017-08-23 DIAGNOSIS — E1165 Type 2 diabetes mellitus with hyperglycemia: Secondary | ICD-10-CM | POA: Diagnosis not present

## 2017-08-23 DIAGNOSIS — M542 Cervicalgia: Secondary | ICD-10-CM | POA: Diagnosis not present

## 2017-08-23 DIAGNOSIS — M1611 Unilateral primary osteoarthritis, right hip: Secondary | ICD-10-CM | POA: Diagnosis not present

## 2017-08-23 DIAGNOSIS — R2689 Other abnormalities of gait and mobility: Secondary | ICD-10-CM | POA: Diagnosis not present

## 2017-08-23 DIAGNOSIS — E119 Type 2 diabetes mellitus without complications: Secondary | ICD-10-CM | POA: Diagnosis not present

## 2017-08-23 DIAGNOSIS — G4700 Insomnia, unspecified: Secondary | ICD-10-CM | POA: Diagnosis not present

## 2017-08-23 DIAGNOSIS — Z471 Aftercare following joint replacement surgery: Secondary | ICD-10-CM | POA: Diagnosis not present

## 2017-08-23 DIAGNOSIS — E785 Hyperlipidemia, unspecified: Secondary | ICD-10-CM | POA: Diagnosis not present

## 2017-08-23 DIAGNOSIS — E782 Mixed hyperlipidemia: Secondary | ICD-10-CM | POA: Diagnosis not present

## 2017-08-23 DIAGNOSIS — G8929 Other chronic pain: Secondary | ICD-10-CM | POA: Diagnosis not present

## 2017-08-26 ENCOUNTER — Encounter: Payer: Self-pay | Admitting: Internal Medicine

## 2017-08-26 ENCOUNTER — Non-Acute Institutional Stay (SKILLED_NURSING_FACILITY): Payer: Medicare PPO | Admitting: Internal Medicine

## 2017-08-26 DIAGNOSIS — M1611 Unilateral primary osteoarthritis, right hip: Secondary | ICD-10-CM

## 2017-08-26 DIAGNOSIS — E1165 Type 2 diabetes mellitus with hyperglycemia: Secondary | ICD-10-CM | POA: Diagnosis not present

## 2017-08-26 DIAGNOSIS — E1169 Type 2 diabetes mellitus with other specified complication: Secondary | ICD-10-CM

## 2017-08-26 DIAGNOSIS — E1142 Type 2 diabetes mellitus with diabetic polyneuropathy: Secondary | ICD-10-CM | POA: Diagnosis not present

## 2017-08-26 DIAGNOSIS — F319 Bipolar disorder, unspecified: Secondary | ICD-10-CM

## 2017-08-26 DIAGNOSIS — E785 Hyperlipidemia, unspecified: Secondary | ICD-10-CM | POA: Diagnosis not present

## 2017-08-26 NOTE — Progress Notes (Signed)
:  Location:  [facility] Room Number: 505-P Place of Service:  SNF ([phone])   Margit Hanks, MD  PCP: Henrine Screws, MD Patient Care Team: Henrine Screws, MD as PCP - General (Family Medicine)  Extended Emergency Contact Information Primary Emergency Contact: Shirline Frees [place] Home Phone: [phone] Mobile Phone: [phone] Relation: Daughter     Allergies: Aspirin; Contrast media [iodinated diagnostic agents]; Fentanyl; Morphine and related; Oxycontin [oxycodone hcl]; Shellfish allergy; and Diazepam  Chief Complaint  Patient presents with  . New Admit To SNF    Admit to [facility]    HPI: Patient is 72 y.o. male hypertension, OSA, diabetes type 2, anxiety, bipolar disease, depression, chronic pain, hyperlipidemia, RLS, and obesity with end-stage osteoarthritis of right hip who was admitted to [facility] from 6/26-28 for a total right hip replacement.  Patient's postoperative period was uneventful and patient started physical therapy in the hospital.  Patient is admitted to skilled nursing facility for OT/PT.  While at skilled nursing facility patient will be followed for neuropathy treated with Neurontin, diabetes treated with glipizide Januvia and metformin, and bipolar disease treated with Lamictal.  Past Medical History:  Diagnosis Date  . Anxiety   . Benign localized prostatic hyperplasia with lower urinary tract symptoms (LUTS)   . Bipolar 1 disorder (HCC)   . Complication of anesthesia    limited neck motion due to cervical spine surgery  . Depression   . ED (erectile dysfunction) of organic origin   . History of penile cancer    s/p  excision of cancer 2013  . HOH (hard of hearing)    left ear  . Hypertension   . Irritable bowel    intermittant diarrhea  secondary to antibiotics  . Legally blind in left eye, as defined in [place]   . Stricture, prostate    urethra  . Type 2 diabetes mellitus  (HCC)   . Weak urinary stream    intermittant    Past Surgical History:  Procedure Laterality Date  . CATARACT EXTRACTION W/ INTRAOCULAR LENS  IMPLANT, BILATERAL  2007  . CERVICAL FUSION  05/ 2016   [facility]   C2 -- C4  . CHOLECYSTECTOMY  2013  approx  . EXCISION PENILE CANCER  2013  . NASAL SEPTUM SURGERY  x2  last one 1990's  . SHOULDER ARTHROSCOPY WITH ROTATOR CUFF REPAIR Left 1990's  . TOTAL KNEE ARTHROPLASTY Bilateral left 2007/  right and revision in 2009  . TRANSURETHRAL INCISION OF PROSTATE N/A 11/21/2015   Procedure: TRANSURETHRAL INCISION OF THE PROSTATE (TUIP);  Surgeon: Hildred Laser, MD;  Location: [facility];  Service: Urology;  Laterality: N/A;  . TRANSURETHRAL RESECTION OF PROSTATE N/A 07/22/2015   Procedure: TRANSURETHRAL RESECTION OF THE PROSTATE ;  Surgeon: Hildred Laser, MD;  Location: [facility];  Service: Urology;  Laterality: N/A;  . VIDEO ASSISTED THORACOSCOPY (VATS)/THOROCOTOMY Left 08/12/2014   Procedure: VIDEO ASSISTED THORACOSCOPY (VATS)/THOROCOTOMY, DRAINAGE OF EMPYEMA;  Surgeon: Alleen Borne, MD;  Location: [facility];  Service: Thoracic;  Laterality: Left;    Allergies as of 08/26/2017      Reactions   Aspirin Other (See Comments)   GI upset   Contrast Media [iodinated Diagnostic Agents] Hives, Swelling   Fentanyl    Pt wishes not to have   Morphine And Related Other (See Comments)   Tremors    Oxycontin [oxycodone Hcl] Other (See Comments)   "pt does  not wish to have"   Shellfish Allergy Hives   All fish and shellfish w/ iodine   Diazepam Other (See Comments)   Patient does not want to use      Medication List        Accurate as of 08/26/17 11:24 AM. Always use your most recent med list.          gabapentin 300 MG capsule Commonly known as:  NEURONTIN Take by mouth.   glipiZIDE 5 MG 24 hr tablet Commonly known as:  GLUCOTROL XL Take 1 tablet (5 mg total) by mouth daily with breakfast.   JANUVIA 100 MG tablet Generic  drug:  sitaGLIPtin TAKE 1 TABLET BY MOUTH EVERY DAY   lamoTRIgine 200 MG tablet Commonly known as:  LAMICTAL Take 200 mg by mouth at bedtime.   losartan 25 MG tablet Commonly known as:  COZAAR Take 25 mg by mouth at bedtime.   metFORMIN 500 MG tablet Commonly known as:  GLUCOPHAGE Take 500 mg by mouth 2 (two) times daily.   metoprolol tartrate 25 MG tablet Commonly known as:  LOPRESSOR Take 1 tablet (25 mg total) by mouth 2 (two) times daily.   mirtazapine 30 MG tablet Commonly known as:  REMERON Take 30 mg by mouth at bedtime.   multivitamin with minerals Tabs tablet Take 1 tablet by mouth daily.   NASACORT ALLERGY 24HR 55 MCG/ACT Aero nasal inhaler Generic drug:  triamcinolone Place 2 sprays into the nose at bedtime as needed (For allergies.).   rosuvastatin 10 MG tablet Commonly known as:  CRESTOR Take 10 mg by mouth every morning.   venlafaxine XR 150 MG 24 hr capsule Commonly known as:  EFFEXOR-XR Take 300 mg by mouth every morning.       No orders of the defined types were placed in this encounter.    There is no immunization history on file for this patient.  Social History   Tobacco Use  . Smoking status: Current Every Day Smoker    Packs/day: 1.00    Years: 53.00    Pack years: 53.00    Types: Cigarettes  . Smokeless tobacco: Never Used  Substance Use Topics  . Alcohol use: No    Alcohol/week: 0.0 oz    Family history is   Family History  Problem Relation Age of Onset  . Diabetes Cousin       Review of Systems  DATA OBTAINED: from patient GENERAL:  no fevers, fatigue, appetite changes SKIN: No itching, or rash EYES: No eye pain, redness, discharge EARS: No earache, tinnitus, change in hearing NOSE: No congestion, drainage or bleeding  MOUTH/THROAT: No mouth or tooth pain, No sore throat RESPIRATORY: No cough, wheezing, SOB CARDIAC: No chest pain, palpitations, lower extremity edema  GI: No abdominal pain, No N/V/D or  constipation, No heartburn or reflux  GU: No dysuria, frequency or urgency, or incontinence  MUSCULOSKELETAL: No unrelieved bone/joint pain NEUROLOGIC: No headache, dizziness or focal weakness PSYCHIATRIC: No c/o anxiety or sadness   Vitals:   08/26/17 1116  BP: 130/70  Pulse: 88  Resp: 18  Temp: 98 F (36.7 C)  SpO2: 97%    SpO2 Readings from Last 1 Encounters:  08/26/17 97%   Body mass index is 29.71 kg/m.     Physical Exam  GENERAL APPEARANCE: Alert, conversant,  No acute distress.  SKIN: No diaphoresis rash HEAD: Normocephalic, atraumatic  EYES: Conjunctiva/lids clear. Pupils round, reactive. EOMs intact.  EARS: External exam WNL, canals clear. Hearing  grossly normal.  NOSE: No deformity or discharge.  MOUTH/THROAT: Lips w/o lesions  RESPIRATORY: Breathing is even, unlabored. Lung sounds are clear   CARDIOVASCULAR: Heart RRR no murmurs, rubs or gallops. No peripheral edema.   GASTROINTESTINAL: Abdomen is soft, non-tender, not distended w/ normal bowel sounds. GENITOURINARY: Bladder non tender, not distended  MUSCULOSKELETAL: No abnormal joints or musculature NEUROLOGIC:  Cranial nerves 2-12 grossly intact. Moves all extremities  PSYCHIATRIC: Mood and affect appropriate to situation, no behavioral issues  Patient Active Problem List   Diagnosis Date Noted  . Uncontrolled type 2 diabetes mellitus with hyperglycemia, without long-term current use of insulin (HCC) 04/17/2016  . Diabetic peripheral neuropathy associated with type 2 diabetes mellitus (HCC) 04/17/2016  . BPH (benign prostatic hyperplasia) 07/22/2015  . Empyema (HCC) 08/12/2014  . Diabetes mellitus without complication (HCC) 08/10/2014  . Community acquired pneumonia 08/10/2014  . Pleural effusion 08/10/2014  . HTN (hypertension) 08/10/2014  . Pneumonia 08/10/2014  . CAP (community acquired pneumonia) 08/10/2014      Labs reviewed: Basic Metabolic Panel:    Component Value Date/Time   NA 136  05/31/2017 1510   K 4.9 05/31/2017 1510   CL 103 05/31/2017 1510   CO2 27 05/31/2017 1510   GLUCOSE 118 (H) 05/31/2017 1510   BUN 10 05/31/2017 1510   CREATININE 1.03 05/31/2017 1510   CALCIUM 9.9 05/31/2017 1510   PROT 7.2 05/31/2017 1510   ALBUMIN 4.4 05/31/2017 1510   AST 25 05/31/2017 1510   ALT 32 05/31/2017 1510   ALKPHOS 62 05/31/2017 1510   BILITOT 0.4 05/31/2017 1510   GFRNONAA >60 07/23/2015 0520   GFRAA >60 07/23/2015 0520    Recent Labs    05/31/17 1510  NA 136  K 4.9  CL 103  CO2 27  GLUCOSE 118*  BUN 10  CREATININE 1.03  CALCIUM 9.9   Liver Function Tests: Recent Labs    05/31/17 1510  AST 25  ALT 32  ALKPHOS 62  BILITOT 0.4  PROT 7.2  ALBUMIN 4.4   No results for input(s): LIPASE, AMYLASE in the last 8760 hours. No results for input(s): AMMONIA in the last 8760 hours. CBC: No results for input(s): WBC, NEUTROABS, HGB, HCT, MCV, PLT in the last 8760 hours. Lipid Recent Labs    05/31/17 1510  CHOL 126  HDL 40.20  LDLCALC 47  TRIG 190.0*    Cardiac Enzymes: No results for input(s): CKTOTAL, CKMB, CKMBINDEX, TROPONINI in the last 8760 hours. BNP: No results for input(s): BNP in the last 8760 hours. Lab Results  Component Value Date   MICROALBUR 15.6 (H) 05/31/2017   Lab Results  Component Value Date   HGBA1C 6.6 (H) 05/31/2017   No results found for: TSH No results found for: VITAMINB12 No results found for: FOLATE No results found for: IRON, TIBC, FERRITIN  Imaging and Procedures obtained prior to SNF admission: Ct Abdomen Pelvis W Contrast  Result Date: 07/04/2016 CLINICAL DATA:  Right flank pain EXAM: CT ABDOMEN AND PELVIS WITH CONTRAST TECHNIQUE: Multidetector CT imaging of the abdomen and pelvis was performed using the standard protocol following bolus administration of intravenous contrast. CONTRAST:  ISOVUE-300 IOPAMIDOL (ISOVUE-300) INJECTION 61% COMPARISON:  11/10/2014 FINDINGS: Lower chest: Minimal scarring is noted  in the left lower lobe. Previously seen consolidation and effusion have resolved in the interval. Hepatobiliary: The gallbladder has been surgically removed. The liver is within normal limits. Pancreas: Unremarkable. No pancreatic ductal dilatation or surrounding inflammatory changes. Spleen: Normal in size without  focal abnormality. Adrenals/Urinary Tract: The adrenal glands are within normal limits. Scattered small cysts are noted within the kidneys. No renal calculi or obstructive changes are noted. Stomach/Bowel: Stomach is within normal limits. Appendix is not well visualized although no inflammatory changes to suggest appendicitis are seen. No evidence of bowel wall thickening, distention, or inflammatory changes. Vascular/Lymphatic: Aortic atherosclerosis. No enlarged abdominal or pelvic lymph nodes. Reproductive: Prostate has been surgically removed. Other: No abdominal wall hernia or abnormality. No abdominopelvic ascites. Musculoskeletal: Degenerative changes of lumbar spine are noted. IMPRESSION: No acute abnormality noted. Electronically Signed   By: Alcide Clever M.D.   On: 07/04/2016 11:04     Not all labs, radiology exams or other studies done during hospitalization come through on my EPIC note; however they are reviewed by me.    Assessment and Plan  Osteoarthritis right hip/status post total hip replacement- patient did well postop, no complications SNF -admitted for OT/PT: Lovenox as prophylaxis for 4 weeks status post surgery making stop date July 27  Polyneuropathy SNF -stable; continue Neurontin 300 mg nightly  Diabetes mellitus type 2 SNF -not stated as uncontrolled; continue Januvia 100 mg daily glipizide 5 mg daily and metformin 500 XR 2 p.o. daily; patient is on ARB and statin  Hyperlipidemia SNF -not stated as uncontrolled; continue Crestor 10 mg daily  Bipolar disorder SNF -appears controlled; continue Lamictal 200 mg nightly    Time spent greater than 45  minutes Margit Hanks, MD

## 2017-08-27 LAB — CBC AND DIFFERENTIAL
HEMATOCRIT: 36 — AB (ref 41–53)
HEMOGLOBIN: 12 — AB (ref 13.5–17.5)
PLATELETS: 276 (ref 150–399)
WBC: 8

## 2017-08-27 LAB — HEPATIC FUNCTION PANEL
ALK PHOS: 104 (ref 25–125)
ALT: 36 (ref 10–40)
AST: 34 (ref 14–40)
BILIRUBIN, TOTAL: 0.5

## 2017-08-27 LAB — BASIC METABOLIC PANEL
BUN: 14 (ref 4–21)
Creatinine: 1 (ref 0.6–1.3)
GLUCOSE: 118
Potassium: 5.1 (ref 3.4–5.3)
Sodium: 139 (ref 137–147)

## 2017-08-30 ENCOUNTER — Non-Acute Institutional Stay (SKILLED_NURSING_FACILITY): Payer: Medicare PPO | Admitting: Internal Medicine

## 2017-08-30 ENCOUNTER — Encounter: Payer: Self-pay | Admitting: Internal Medicine

## 2017-08-30 DIAGNOSIS — M1611 Unilateral primary osteoarthritis, right hip: Secondary | ICD-10-CM

## 2017-08-30 DIAGNOSIS — F319 Bipolar disorder, unspecified: Secondary | ICD-10-CM

## 2017-08-30 DIAGNOSIS — E1165 Type 2 diabetes mellitus with hyperglycemia: Secondary | ICD-10-CM | POA: Diagnosis not present

## 2017-08-30 DIAGNOSIS — E1169 Type 2 diabetes mellitus with other specified complication: Secondary | ICD-10-CM | POA: Diagnosis not present

## 2017-08-30 DIAGNOSIS — E1142 Type 2 diabetes mellitus with diabetic polyneuropathy: Secondary | ICD-10-CM

## 2017-08-30 DIAGNOSIS — E785 Hyperlipidemia, unspecified: Secondary | ICD-10-CM | POA: Diagnosis not present

## 2017-08-30 NOTE — Progress Notes (Signed)
Nursing Home Room Number: 505-P Place of Service:  SNF (31)  Dwayne Vargas. Dwayne Hollingshead, MD  Patient Care Team: Dwayne Screws, MD as PCP - [facility])  Extended Emergency Contact Information Primary Emergency Contact: Dwayne Vargas [place] of [place] Home Phone: [phone] Mobile Phone: [phone] Relation: Daughter  Allergies  Allergen Reactions  . Aspirin Other (See Comments)    GI upset  . Contrast Media [Iodinated Diagnostic Agents] Hives and Swelling  . Fentanyl     Pt wishes not to have  . Morphine And Related Other (See Comments)    Tremors   . Oxycontin [Oxycodone Hcl] Other (See Comments)    "pt does not wish to have"  . Shellfish Allergy Hives    All fish and shellfish w/ iodine  . Diazepam Other (See Comments)    Patient does not want to use    Chief Complaint  Patient presents with  . Discharge Note    Discharge from [facility]     HPI:  72 y.o. male with hypertension, OSA, diabetes mellitus type 2, anxiety, bipolar disease, depression, chronic pain, hyperlipidemia, RLS, and obesity with end-stage osteoarthritis of right hip who was [facility] 6-28 for total right hip replacement.  Patient's postop period was very uneventful and patient was started on physical therapy in the hospital.  Patient was admitted to skilled nursing facility for OT/PT and is now ready to be discharged to home.    Past Medical History:  Diagnosis Date  . Anxiety   . Benign localized prostatic hyperplasia with lower urinary tract symptoms (LUTS)   . Bipolar 1 disorder (HCC)   . Complication of anesthesia    limited neck motion due to cervical spine surgery  . Depression   . ED (erectile dysfunction) of organic origin   . History of penile cancer    s/p  excision of cancer 2013  . HOH (hard of hearing)    left ear  . Hypertension   . Irritable bowel    intermittant diarrhea  secondary to antibiotics  . Legally blind in left  eye, as defined in [place]   . Stricture, prostate    urethra  . Type 2 diabetes mellitus (HCC)   . Weak urinary stream    intermittant    Past Surgical History:  Procedure Laterality Date  . CATARACT EXTRACTION W/ INTRAOCULAR LENS  IMPLANT, BILATERAL  2007  . CERVICAL FUSION  05/ 2016   [facility]   C2 -- C4  . CHOLECYSTECTOMY  2013  approx  . EXCISION PENILE CANCER  2013  . NASAL SEPTUM SURGERY  x2  last one 1990's  . SHOULDER ARTHROSCOPY WITH ROTATOR CUFF REPAIR Left 1990's  . TOTAL KNEE ARTHROPLASTY Bilateral left 2007/  right and revision in 2009  . TRANSURETHRAL INCISION OF PROSTATE N/A 11/21/2015   Procedure: TRANSURETHRAL INCISION OF THE PROSTATE (TUIP);  Surgeon: Hildred Laser, MD;  Location: [facility];  Service: Urology;  Laterality: N/A;  . TRANSURETHRAL RESECTION OF PROSTATE N/A 07/22/2015   Procedure: TRANSURETHRAL RESECTION OF THE PROSTATE ;  Surgeon: Hildred Laser, MD;  Location: [facility];  Service: Urology;  Laterality: N/A;  . VIDEO ASSISTED THORACOSCOPY (VATS)/THOROCOTOMY Left 08/12/2014   Procedure: VIDEO ASSISTED THORACOSCOPY (VATS)/THOROCOTOMY, DRAINAGE OF EMPYEMA;  Surgeon: Alleen Borne, MD;  Location: [facility] OR;  Service: Thoracic;  Laterality: Left;     reports that he has been smoking cigarettes.  He has a 53.00 pack-year smoking  history. He has never used smokeless tobacco. He reports that he does not drink alcohol or use drugs. Social History   Socioeconomic History  . Marital status: Widowed    Spouse name: Not on file  . Number of children: Not on file  . Years of education: Not on file  . Highest education level: Not on file  Occupational History  . Not on file  Social Needs  . Financial resource strain: Not on file  . Food insecurity:    Worry: Not on file    Inability: Not on file  . Transportation needs:    Medical: Not on file    Non-medical: Not on file  Tobacco Use  . Smoking status: Current Every Day Smoker    Packs/day:  1.00    Years: 53.00    Pack years: 53.00    Types: Cigarettes  . Smokeless tobacco: Never Used  Substance and Sexual Activity  . Alcohol use: No    Alcohol/week: 0.0 oz  . Drug use: No  . Sexual activity: Never  Lifestyle  . Physical activity:    Days per week: Not on file    Minutes per session: Not on file  . Stress: Not on file  Relationships  . Social connections:    Talks on phone: Not on file    Gets together: Not on file    Attends religious service: Not on file    Active member of club or organization: Not on file    Attends meetings of clubs or organizations: Not on file    Relationship status: Not on file  . Intimate partner violence:    Fear of current or ex partner: Not on file    Emotionally abused: Not on file    Physically abused: Not on file    Forced sexual activity: Not on file  Other Topics Concern  . Not on file  Social History Narrative  . Not on file    Pertinent  Health Maintenance Due  Topic Date Due  . OPHTHALMOLOGY EXAM  10/20/1954  . COLONOSCOPY  10/20/1994  . PNA vac Low Risk Adult (1 of 2 - PCV13) 10/19/2009  . FOOT EXAM  04/16/2017  . INFLUENZA VACCINE  09/26/2017  . HEMOGLOBIN A1C  11/30/2017    Medications: Allergies as of 08/30/2017      Reactions   Aspirin Other (See Comments)   GI upset   Contrast Media [iodinated Diagnostic Agents] Hives, Swelling   Fentanyl    Pt wishes not to have   Morphine And Related Other (See Comments)   Tremors    Oxycontin [oxycodone Hcl] Other (See Comments)   "pt does not wish to have"   Shellfish Allergy Hives   All fish and shellfish w/ iodine   Diazepam Other (See Comments)   Patient does not want to use      Medication List        Accurate as of 08/30/17 11:59 PM. Always use your most recent med list.          cyclobenzaprine 10 MG tablet Commonly known as:  FLEXERIL Take 10 mg by mouth 3 (three) times daily as needed for muscle spasms.   enoxaparin 40 MG/0.4ML  injection Commonly known as:  LOVENOX Inject 40 mg into the skin daily. Stop date 7/24   gabapentin 300 MG capsule Commonly known as:  NEURONTIN Take by mouth.   glipiZIDE 5 MG 24 hr tablet Commonly known as:  GLUCOTROL XL Take 1 tablet (  5 mg total) by mouth daily with breakfast.   JANUVIA 100 MG tablet Generic drug:  sitaGLIPtin TAKE 1 TABLET BY MOUTH EVERY DAY   lamoTRIgine 200 MG tablet Commonly known as:  LAMICTAL Take 200 mg by mouth at bedtime.   losartan 25 MG tablet Commonly known as:  COZAAR Take 25 mg by mouth at bedtime.   Melatonin 5 MG Tabs Take 1 tablet by mouth at bedtime.   metFORMIN 500 MG tablet Commonly known as:  GLUCOPHAGE Take 1,000 mg by mouth daily with breakfast.   metoprolol tartrate 25 MG tablet Commonly known as:  LOPRESSOR Take 1 tablet (25 mg total) by mouth 2 (two) times daily.   multivitamin with minerals Tabs tablet Take 1 tablet by mouth daily.   rosuvastatin 10 MG tablet Commonly known as:  CRESTOR Take 10 mg by mouth every morning.   sennosides-docusate sodium 8.6-50 MG tablet Commonly known as:  SENOKOT-S Take 1 tablet by mouth 2 (two) times daily.   traZODone 50 MG tablet Commonly known as:  DESYREL Take 50 mg by mouth at bedtime.        Vitals:   08/30/17 1158  BP: 123/73  Pulse: 80  Resp: 20  Temp: 97.8 F (36.6 C)  SpO2: 97%  Weight: 213 lb (96.6 kg)  Height: 5\' 11"  (1.803 m)   Body mass index is 29.71 kg/m.  Physical Exam  GENERAL APPEARANCE: Alert, conversant. No acute distress.  HEENT: Unremarkable. RESPIRATORY: Breathing is even, unlabored. Lung sounds are clear   CARDIOVASCULAR: Heart RRR no murmurs, rubs or gallops. No peripheral edema.  GASTROINTESTINAL: Abdomen is soft, non-tender, not distended w/ normal bowel sounds.  NEUROLOGIC: Cranial nerves 2-12 grossly intact. Moves all extremities   Labs reviewed: Basic Metabolic Panel: Recent Labs    05/31/17 1510 08/27/17  NA 136 139  K 4.9  5.1  CL 103  --   CO2 27  --   GLUCOSE 118*  --   BUN 10 14  CREATININE 1.03 1.0  CALCIUM 9.9  --    Lab Results  Component Value Date   MICROALBUR 15.6 (H) 05/31/2017   Liver Function Tests: Recent Labs    05/31/17 1510 08/27/17  AST 25 34  ALT 32 36  ALKPHOS 62 104  BILITOT 0.4  --   PROT 7.2  --   ALBUMIN 4.4  --    No results for input(s): LIPASE, AMYLASE in the last 8760 hours. No results for input(s): AMMONIA in the last 8760 hours. CBC: Recent Labs    08/27/17  WBC 8.0  HGB 12.0*  HCT 36*  PLT 276   Lipid Recent Labs    05/31/17 1510  CHOL 126  HDL 40.20  LDLCALC 47  TRIG 190.0*   Cardiac Enzymes: No results for input(s): CKTOTAL, CKMB, CKMBINDEX, TROPONINI in the last 8760 hours. BNP: No results for input(s): BNP in the last 8760 hours. CBG: No results for input(s): GLUCAP in the last 8760 hours.  Procedures and Imaging Studies During Stay: No results found.  Assessment/Plan:   Primary osteoarthritis of right hip  Hyperlipidemia associated with type 2 diabetes mellitus (HCC)  Bipolar affective disorder, remission status unspecified (HCC)  Diabetic peripheral neuropathy associated with type 2 diabetes mellitus (HCC)  Uncontrolled type 2 diabetes mellitus with hyperglycemia, without long-term current use of insulin (HCC)   Patient is being discharged with the following home health services: OT/PT   Patient is being discharged with the following durable medical equipment:  Rolling  walker  Patient has been advised to f/u with their PCP in 1-2 weeks to bring them up to date on their rehab stay.  Social services at facility was responsible for arranging this appointment.  Pt was provided with a 30 day supply of prescriptions for medications and refills must be obtained from their PCP.  For controlled substances, a more limited supply may be provided adequate until PCP appointment only.  Medications have been reconciled.  Time spent > 30 min;>  50% of time with patient was spent reviewing records, labs, tests and studies, counseling and developing plan of care  Dwayne Vargas. Dwayne Hollingshead, MD

## 2017-09-01 ENCOUNTER — Encounter: Payer: Self-pay | Admitting: Internal Medicine

## 2017-09-01 DIAGNOSIS — F419 Anxiety disorder, unspecified: Secondary | ICD-10-CM | POA: Diagnosis not present

## 2017-09-01 DIAGNOSIS — R2689 Other abnormalities of gait and mobility: Secondary | ICD-10-CM | POA: Diagnosis not present

## 2017-09-01 DIAGNOSIS — M1611 Unilateral primary osteoarthritis, right hip: Secondary | ICD-10-CM | POA: Insufficient documentation

## 2017-09-01 DIAGNOSIS — E785 Hyperlipidemia, unspecified: Secondary | ICD-10-CM

## 2017-09-01 DIAGNOSIS — E1169 Type 2 diabetes mellitus with other specified complication: Secondary | ICD-10-CM | POA: Insufficient documentation

## 2017-09-01 DIAGNOSIS — Z96641 Presence of right artificial hip joint: Secondary | ICD-10-CM | POA: Diagnosis not present

## 2017-09-01 DIAGNOSIS — Z471 Aftercare following joint replacement surgery: Secondary | ICD-10-CM | POA: Diagnosis not present

## 2017-09-01 DIAGNOSIS — J439 Emphysema, unspecified: Secondary | ICD-10-CM | POA: Diagnosis not present

## 2017-09-01 DIAGNOSIS — F319 Bipolar disorder, unspecified: Secondary | ICD-10-CM | POA: Insufficient documentation

## 2017-09-01 DIAGNOSIS — M6281 Muscle weakness (generalized): Secondary | ICD-10-CM | POA: Diagnosis not present

## 2017-09-01 DIAGNOSIS — I10 Essential (primary) hypertension: Secondary | ICD-10-CM | POA: Diagnosis not present

## 2017-09-01 DIAGNOSIS — E1142 Type 2 diabetes mellitus with diabetic polyneuropathy: Secondary | ICD-10-CM | POA: Diagnosis not present

## 2017-09-04 DIAGNOSIS — F419 Anxiety disorder, unspecified: Secondary | ICD-10-CM | POA: Diagnosis not present

## 2017-09-04 DIAGNOSIS — Z471 Aftercare following joint replacement surgery: Secondary | ICD-10-CM | POA: Diagnosis not present

## 2017-09-04 DIAGNOSIS — I10 Essential (primary) hypertension: Secondary | ICD-10-CM | POA: Diagnosis not present

## 2017-09-04 DIAGNOSIS — E1142 Type 2 diabetes mellitus with diabetic polyneuropathy: Secondary | ICD-10-CM | POA: Diagnosis not present

## 2017-09-04 DIAGNOSIS — F319 Bipolar disorder, unspecified: Secondary | ICD-10-CM | POA: Diagnosis not present

## 2017-09-04 DIAGNOSIS — J439 Emphysema, unspecified: Secondary | ICD-10-CM | POA: Diagnosis not present

## 2017-09-13 ENCOUNTER — Emergency Department (HOSPITAL_COMMUNITY)
Admission: EM | Admit: 2017-09-13 | Discharge: 2017-09-13 | Disposition: A | Discharge status: Home / Self Care | Payer: Medicare PPO | Attending: Emergency Medicine | Admitting: Emergency Medicine

## 2017-09-13 DIAGNOSIS — E162 Hypoglycemia, unspecified: Secondary | ICD-10-CM | POA: Diagnosis not present

## 2017-09-13 DIAGNOSIS — R52 Pain, unspecified: Secondary | ICD-10-CM | POA: Diagnosis not present

## 2017-09-13 DIAGNOSIS — E119 Type 2 diabetes mellitus without complications: Secondary | ICD-10-CM | POA: Insufficient documentation

## 2017-09-13 DIAGNOSIS — F319 Bipolar disorder, unspecified: Secondary | ICD-10-CM | POA: Diagnosis not present

## 2017-09-13 DIAGNOSIS — E161 Other hypoglycemia: Secondary | ICD-10-CM | POA: Diagnosis not present

## 2017-09-13 DIAGNOSIS — Z96641 Presence of right artificial hip joint: Secondary | ICD-10-CM | POA: Diagnosis not present

## 2017-09-13 DIAGNOSIS — G4730 Sleep apnea, unspecified: Secondary | ICD-10-CM | POA: Diagnosis not present

## 2017-09-13 DIAGNOSIS — Y829 Unspecified medical devices associated with adverse incidents: Secondary | ICD-10-CM | POA: Diagnosis not present

## 2017-09-13 DIAGNOSIS — T8130XA Disruption of wound, unspecified, initial encounter: Secondary | ICD-10-CM | POA: Insufficient documentation

## 2017-09-13 DIAGNOSIS — M549 Dorsalgia, unspecified: Secondary | ICD-10-CM | POA: Diagnosis not present

## 2017-09-13 DIAGNOSIS — E785 Hyperlipidemia, unspecified: Secondary | ICD-10-CM | POA: Diagnosis not present

## 2017-09-13 DIAGNOSIS — M7989 Other specified soft tissue disorders: Secondary | ICD-10-CM | POA: Diagnosis not present

## 2017-09-13 DIAGNOSIS — I10 Essential (primary) hypertension: Secondary | ICD-10-CM | POA: Insufficient documentation

## 2017-09-13 DIAGNOSIS — G8929 Other chronic pain: Secondary | ICD-10-CM | POA: Diagnosis not present

## 2017-09-13 DIAGNOSIS — M19039 Primary osteoarthritis, unspecified wrist: Secondary | ICD-10-CM | POA: Diagnosis not present

## 2017-09-13 DIAGNOSIS — T8131XA Disruption of external operation (surgical) wound, not elsewhere classified, initial encounter: Secondary | ICD-10-CM | POA: Diagnosis not present

## 2017-09-13 DIAGNOSIS — F419 Anxiety disorder, unspecified: Secondary | ICD-10-CM | POA: Diagnosis not present

## 2017-09-13 DIAGNOSIS — F17210 Nicotine dependence, cigarettes, uncomplicated: Secondary | ICD-10-CM | POA: Diagnosis not present

## 2017-09-13 DIAGNOSIS — Z79899 Other long term (current) drug therapy: Secondary | ICD-10-CM | POA: Insufficient documentation

## 2017-09-13 DIAGNOSIS — R600 Localized edema: Secondary | ICD-10-CM | POA: Diagnosis not present

## 2017-09-13 MED ORDER — OXYCODONE-ACETAMINOPHEN 5-325 MG PO TABS
1.0000 | ORAL_TABLET | Freq: Once | ORAL | Status: AC
  Administered 2017-09-13: 1 via ORAL
  Filled 2017-09-13: qty 1

## 2017-09-13 NOTE — ED Notes (Signed)
Bed: [id number] Expected date:  Expected time:  Means of arrival:  Comments: RUE:[id number]:HipPain/Post Op

## 2017-09-13 NOTE — ED Triage Notes (Signed)
Transported by [facility] from home--had hip surgery on 6/26 (right hip replacement). Patient was attempting to bend down and opened the surgical incision. Bleeding controlled and area  covered with gauze. VSS, +mild pain.

## 2017-09-13 NOTE — Discharge Instructions (Addendum)
Go to [facility] to see the orthopedic surgeons.

## 2017-09-13 NOTE — ED Provider Notes (Signed)
[facility]-EMERGENCY DEPT Provider Note   CSN: [id number] Arrival date & time: 09/13/17  1632     History   Chief Complaint Chief Complaint  Patient presents with  . Wound Dehiscence    HPI Dwayne Vargas is a 72 y.o. male.  HPI Patient presents with dehiscence of his surgical wound.  On June 26 he had a right hip replacement at [facility] by Dr. Dolores Lory.  This afternoon he bent over and the inferior part of the wound dehisced.  Had some bleeding but it stopped.  No fevers.  No other injury. Past Medical History:  Diagnosis Date  . Anxiety   . Benign localized prostatic hyperplasia with lower urinary tract symptoms (LUTS)   . Bipolar 1 disorder (HCC)   . Complication of anesthesia    limited neck motion due to cervical spine surgery  . Depression   . ED (erectile dysfunction) of organic origin   . History of penile cancer    s/p  excision of cancer 2013  . HOH (hard of hearing)    left ear  . Hypertension   . Irritable bowel    intermittant diarrhea  secondary to antibiotics  . Legally blind in left eye, as defined in [place]   . Stricture, prostate    urethra  . Type 2 diabetes mellitus (HCC)   . Weak urinary stream    intermittant    Patient Active Problem List   Diagnosis Date Noted  . Osteoarthritis of right hip 09/01/2017  . Hyperlipidemia associated with type 2 diabetes mellitus (HCC) 09/01/2017  . Bipolar disorder (HCC) 09/01/2017  . Uncontrolled type 2 diabetes mellitus with hyperglycemia, without long-term current use of insulin (HCC) 04/17/2016  . Diabetic peripheral neuropathy associated with type 2 diabetes mellitus (HCC) 04/17/2016  . BPH (benign prostatic hyperplasia) 07/22/2015  . Empyema (HCC) 08/12/2014  . Diabetes mellitus without complication (HCC) 08/10/2014  . Community acquired pneumonia 08/10/2014  . Pleural effusion 08/10/2014  . HTN (hypertension) 08/10/2014  . Pneumonia 08/10/2014  . CAP (community acquired  pneumonia) 08/10/2014    Past Surgical History:  Procedure Laterality Date  . CATARACT EXTRACTION W/ INTRAOCULAR LENS  IMPLANT, BILATERAL  2007  . CERVICAL FUSION  05/ 2016   [facility]   C2 -- C4  . CHOLECYSTECTOMY  2013  approx  . EXCISION PENILE CANCER  2013  . NASAL SEPTUM SURGERY  x2  last one 1990's  . SHOULDER ARTHROSCOPY WITH ROTATOR CUFF REPAIR Left 1990's  . TOTAL KNEE ARTHROPLASTY Bilateral left 2007/  right and revision in 2009  . TRANSURETHRAL INCISION OF PROSTATE N/A 11/21/2015   Procedure: TRANSURETHRAL INCISION OF THE PROSTATE (TUIP);  Surgeon: Hildred Laser, MD;  Location: [facility];  Service: Urology;  Laterality: N/A;  . TRANSURETHRAL RESECTION OF PROSTATE N/A 07/22/2015   Procedure: TRANSURETHRAL RESECTION OF THE PROSTATE ;  Surgeon: Hildred Laser, MD;  Location: [facility];  Service: Urology;  Laterality: N/A;  . VIDEO ASSISTED THORACOSCOPY (VATS)/THOROCOTOMY Left 08/12/2014   Procedure: VIDEO ASSISTED THORACOSCOPY (VATS)/THOROCOTOMY, DRAINAGE OF EMPYEMA;  Surgeon: Alleen Borne, MD;  Location: [facility];  Service: Thoracic;  Laterality: Left;        Home Medications    Prior to Admission medications   Medication Sig Start Date End Date Taking? Authorizing Provider  acetaminophen (TYLENOL) 500 MG tablet Take 500 mg by mouth every 6 (six) hours as needed for moderate pain.   Yes [provider]  cyclobenzaprine (FLEXERIL) 10  MG tablet Take 10 mg by mouth 3 (three) times daily as needed for muscle spasms.   Yes [provider]  glipiZIDE (GLUCOTROL XL) 5 MG 24 hr tablet Take 1 tablet (5 mg total) by mouth daily with breakfast. 06/05/17  Yes Reather Littler, MD  JANUVIA 100 MG tablet TAKE 1 TABLET BY MOUTH EVERY DAY 08/22/17  Yes Reather Littler, MD  lamoTRIgine (LAMICTAL) 200 MG tablet Take 200 mg by mouth at bedtime. 07/29/14  Yes [provider]  losartan (COZAAR) 25 MG tablet Take 25 mg by mouth at bedtime.  08/10/14  Yes [provider]  Melatonin 5 MG TABS Take 1 tablet by mouth at bedtime.   Yes [provider]  metFORMIN (GLUCOPHAGE) 500 MG tablet Take 1,000 mg by mouth daily with breakfast.  08/10/14  Yes [provider]  metoprolol tartrate (LOPRESSOR) 25 MG tablet Take 1 tablet (25 mg total) by mouth 2 (two) times daily. 08/18/14  Yes Doree Fudge M, PA-C  Multiple Vitamin (MULTIVITAMIN WITH MINERALS) TABS tablet Take 1 tablet by mouth daily.   Yes [provider]  rosuvastatin (CRESTOR) 10 MG tablet Take 10 mg by mouth every evening.    Yes [provider]  traZODone (DESYREL) 50 MG tablet Take 50 mg by mouth at bedtime.   Yes [provider]    Family History Family History  Problem Relation Age of Onset  . Diabetes Cousin     Social History Social History   Tobacco Use  . Smoking status: Current Every Day Smoker    Packs/day: 1.00    Years: 53.00    Pack years: 53.00    Types: Cigarettes  . Smokeless tobacco: Never Used  Substance Use Topics  . Alcohol use: No    Alcohol/week: 0.0 oz  . Drug use: No     Allergies   Aspirin; Contrast media [iodinated diagnostic agents]; Fentanyl; Morphine and related; Oxycontin [oxycodone hcl]; Shellfish allergy; and Diazepam   Review of Systems Review of Systems  Constitutional: Negative for appetite change.  Gastrointestinal: Negative for abdominal pain.  Musculoskeletal: Negative for gait problem.  Skin: Positive for wound.  Neurological: Negative for weakness.     Physical Exam Updated Vital Signs BP (!) 140/99   Pulse 74   Temp 98.6 F (37 C) (Oral)   Resp 15   SpO2 100%   Physical Exam  Constitutional: He appears well-developed.  HENT:  Head: Normocephalic.  Cardiovascular: Normal rate.  Abdominal: There is no tenderness.  Musculoskeletal:  Wound from right hip replacement has dehisced inferiorly.  About 50% of the inferior wound is dehisced and is spread somewhat wide.   Probably 2-1/2 cm wide.  Wound dehiscence is approximately 5 to 6 cm long.  Neurological: He is alert.  Skin: Skin is warm.     ED Treatments / Results  Labs (all labs ordered are listed, but only abnormal results are displayed) Labs Reviewed - No data to display  EKG None  Radiology No results found.  Procedures Procedures (including critical care time)  Medications Ordered in ED Medications  oxyCODONE-acetaminophen (PERCOCET/ROXICET) 5-325 MG per tablet 1 tablet (has no administration in time range)     Initial Impression / Assessment and Plan / ED Course  I have reviewed the triage vital signs and the nursing notes.  Pertinent labs & imaging results that were available during my care of the patient were reviewed by me and considered in my medical decision making (see chart for details).  Patient with dehiscence of surgical wound.  Discussed with Dr. Berna Bueochran, who is covering for Dr. Dolores Loryhallows.  [facility]  Patient will go by private vehicle.  Rewrapped wound.  Discharge.  Final Clinical Impressions(s) / ED Diagnoses   Final diagnoses:  Wound dehiscence    ED Discharge Orders    None       Benjiman CorePickering, Reggie Bise, MD 09/13/17 1929

## 2017-09-19 DIAGNOSIS — I10 Essential (primary) hypertension: Secondary | ICD-10-CM | POA: Diagnosis not present

## 2017-09-19 DIAGNOSIS — E1142 Type 2 diabetes mellitus with diabetic polyneuropathy: Secondary | ICD-10-CM | POA: Diagnosis not present

## 2017-09-19 DIAGNOSIS — Z471 Aftercare following joint replacement surgery: Secondary | ICD-10-CM | POA: Diagnosis not present

## 2017-09-19 DIAGNOSIS — F319 Bipolar disorder, unspecified: Secondary | ICD-10-CM | POA: Diagnosis not present

## 2017-09-19 DIAGNOSIS — F419 Anxiety disorder, unspecified: Secondary | ICD-10-CM | POA: Diagnosis not present

## 2017-09-19 DIAGNOSIS — J439 Emphysema, unspecified: Secondary | ICD-10-CM | POA: Diagnosis not present

## 2017-09-24 DIAGNOSIS — T847XXA Infection and inflammatory reaction due to other internal orthopedic prosthetic devices, implants and grafts, initial encounter: Secondary | ICD-10-CM | POA: Diagnosis not present

## 2017-09-25 ENCOUNTER — Other Ambulatory Visit (HOSPITAL_COMMUNITY): Payer: Self-pay | Admitting: Family Medicine

## 2017-09-25 DIAGNOSIS — T8451XA Infection and inflammatory reaction due to internal right hip prosthesis, initial encounter: Secondary | ICD-10-CM

## 2017-09-26 ENCOUNTER — Ambulatory Visit (HOSPITAL_COMMUNITY)
Admission: RE | Admit: 2017-09-26 | Discharge: 2017-09-26 | Disposition: A | Discharge status: Home / Self Care | Payer: Medicare PPO | Source: Ambulatory Visit | Attending: Family Medicine | Admitting: Family Medicine

## 2017-09-26 DIAGNOSIS — Y793 Surgical instruments, materials and orthopedic devices (including sutures) associated with adverse incidents: Secondary | ICD-10-CM | POA: Insufficient documentation

## 2017-09-26 DIAGNOSIS — T8459XA Infection and inflammatory reaction due to other internal joint prosthesis, initial encounter: Secondary | ICD-10-CM | POA: Diagnosis present

## 2017-09-26 DIAGNOSIS — T8451XA Infection and inflammatory reaction due to internal right hip prosthesis, initial encounter: Secondary | ICD-10-CM

## 2017-09-26 DIAGNOSIS — Z452 Encounter for adjustment and management of vascular access device: Secondary | ICD-10-CM | POA: Diagnosis not present

## 2017-09-26 MED ORDER — LIDOCAINE HCL (PF) 1 % IJ SOLN
INTRAMUSCULAR | Status: DC | PRN
  Administered 2017-09-26: 5 mL

## 2017-09-26 MED ORDER — HEPARIN SOD (PORK) LOCK FLUSH 100 UNIT/ML IV SOLN
INTRAVENOUS | Status: AC
  Filled 2017-09-26: qty 5

## 2017-09-26 MED ORDER — LIDOCAINE HCL 1 % IJ SOLN
INTRAMUSCULAR | Status: AC
  Filled 2017-09-26: qty 20

## 2017-09-26 NOTE — Procedures (Signed)
Right SL basilic vein PICC placed without immediate complications via US/fluoro guidance. Length 42 cm. Tip SVC/RA junction. Ok to use.

## 2017-09-27 DIAGNOSIS — T847XXA Infection and inflammatory reaction due to other internal orthopedic prosthetic devices, implants and grafts, initial encounter: Secondary | ICD-10-CM | POA: Diagnosis not present

## 2017-09-30 DIAGNOSIS — T847XXA Infection and inflammatory reaction due to other internal orthopedic prosthetic devices, implants and grafts, initial encounter: Secondary | ICD-10-CM | POA: Diagnosis not present

## 2017-10-01 DIAGNOSIS — T8451XD Infection and inflammatory reaction due to internal right hip prosthesis, subsequent encounter: Secondary | ICD-10-CM | POA: Diagnosis not present

## 2017-10-01 DIAGNOSIS — T847XXD Infection and inflammatory reaction due to other internal orthopedic prosthetic devices, implants and grafts, subsequent encounter: Secondary | ICD-10-CM | POA: Diagnosis not present

## 2017-10-01 DIAGNOSIS — Z79899 Other long term (current) drug therapy: Secondary | ICD-10-CM | POA: Diagnosis not present

## 2017-10-01 DIAGNOSIS — B9561 Methicillin susceptible Staphylococcus aureus infection as the cause of diseases classified elsewhere: Secondary | ICD-10-CM | POA: Diagnosis not present

## 2017-10-04 DIAGNOSIS — T847XXA Infection and inflammatory reaction due to other internal orthopedic prosthetic devices, implants and grafts, initial encounter: Secondary | ICD-10-CM | POA: Diagnosis not present

## 2017-10-07 DIAGNOSIS — T847XXA Infection and inflammatory reaction due to other internal orthopedic prosthetic devices, implants and grafts, initial encounter: Secondary | ICD-10-CM | POA: Diagnosis not present

## 2017-10-09 DIAGNOSIS — T847XXA Infection and inflammatory reaction due to other internal orthopedic prosthetic devices, implants and grafts, initial encounter: Secondary | ICD-10-CM | POA: Diagnosis not present

## 2017-10-14 DIAGNOSIS — T847XXA Infection and inflammatory reaction due to other internal orthopedic prosthetic devices, implants and grafts, initial encounter: Secondary | ICD-10-CM | POA: Diagnosis not present

## 2017-10-15 DIAGNOSIS — T8451XD Infection and inflammatory reaction due to internal right hip prosthesis, subsequent encounter: Secondary | ICD-10-CM | POA: Diagnosis not present

## 2017-10-15 DIAGNOSIS — T847XXD Infection and inflammatory reaction due to other internal orthopedic prosthetic devices, implants and grafts, subsequent encounter: Secondary | ICD-10-CM | POA: Diagnosis not present

## 2017-10-15 DIAGNOSIS — B9561 Methicillin susceptible Staphylococcus aureus infection as the cause of diseases classified elsewhere: Secondary | ICD-10-CM | POA: Diagnosis not present

## 2017-10-16 DIAGNOSIS — T847XXA Infection and inflammatory reaction due to other internal orthopedic prosthetic devices, implants and grafts, initial encounter: Secondary | ICD-10-CM | POA: Diagnosis not present

## 2017-10-17 DIAGNOSIS — Z471 Aftercare following joint replacement surgery: Secondary | ICD-10-CM | POA: Diagnosis not present

## 2017-10-17 DIAGNOSIS — Z96641 Presence of right artificial hip joint: Secondary | ICD-10-CM | POA: Diagnosis not present

## 2017-10-21 DIAGNOSIS — T847XXA Infection and inflammatory reaction due to other internal orthopedic prosthetic devices, implants and grafts, initial encounter: Secondary | ICD-10-CM | POA: Diagnosis not present

## 2017-10-23 DIAGNOSIS — T847XXA Infection and inflammatory reaction due to other internal orthopedic prosthetic devices, implants and grafts, initial encounter: Secondary | ICD-10-CM | POA: Diagnosis not present

## 2017-10-27 DIAGNOSIS — T847XXA Infection and inflammatory reaction due to other internal orthopedic prosthetic devices, implants and grafts, initial encounter: Secondary | ICD-10-CM | POA: Diagnosis not present

## 2017-10-28 DIAGNOSIS — T847XXA Infection and inflammatory reaction due to other internal orthopedic prosthetic devices, implants and grafts, initial encounter: Secondary | ICD-10-CM | POA: Diagnosis not present

## 2017-10-30 DIAGNOSIS — T847XXA Infection and inflammatory reaction due to other internal orthopedic prosthetic devices, implants and grafts, initial encounter: Secondary | ICD-10-CM | POA: Diagnosis not present

## 2017-11-04 DIAGNOSIS — T847XXA Infection and inflammatory reaction due to other internal orthopedic prosthetic devices, implants and grafts, initial encounter: Secondary | ICD-10-CM | POA: Diagnosis not present

## 2017-11-06 DIAGNOSIS — T847XXA Infection and inflammatory reaction due to other internal orthopedic prosthetic devices, implants and grafts, initial encounter: Secondary | ICD-10-CM | POA: Diagnosis not present

## 2017-11-08 DIAGNOSIS — T847XXD Infection and inflammatory reaction due to other internal orthopedic prosthetic devices, implants and grafts, subsequent encounter: Secondary | ICD-10-CM | POA: Diagnosis not present

## 2017-11-27 DIAGNOSIS — L309 Dermatitis, unspecified: Secondary | ICD-10-CM | POA: Diagnosis not present

## 2017-11-27 DIAGNOSIS — M25551 Pain in right hip: Secondary | ICD-10-CM | POA: Diagnosis not present

## 2017-11-27 DIAGNOSIS — E119 Type 2 diabetes mellitus without complications: Secondary | ICD-10-CM | POA: Diagnosis not present

## 2017-12-03 DIAGNOSIS — T847XXD Infection and inflammatory reaction due to other internal orthopedic prosthetic devices, implants and grafts, subsequent encounter: Secondary | ICD-10-CM | POA: Diagnosis not present

## 2017-12-26 ENCOUNTER — Other Ambulatory Visit: Payer: Self-pay | Admitting: Endocrinology

## 2017-12-31 DIAGNOSIS — B9561 Methicillin susceptible Staphylococcus aureus infection as the cause of diseases classified elsewhere: Secondary | ICD-10-CM | POA: Diagnosis not present

## 2017-12-31 DIAGNOSIS — T847XXD Infection and inflammatory reaction due to other internal orthopedic prosthetic devices, implants and grafts, subsequent encounter: Secondary | ICD-10-CM | POA: Diagnosis not present

## 2017-12-31 DIAGNOSIS — I10 Essential (primary) hypertension: Secondary | ICD-10-CM | POA: Diagnosis not present

## 2017-12-31 DIAGNOSIS — T8451XD Infection and inflammatory reaction due to internal right hip prosthesis, subsequent encounter: Secondary | ICD-10-CM | POA: Diagnosis not present

## 2018-01-01 DIAGNOSIS — L309 Dermatitis, unspecified: Secondary | ICD-10-CM | POA: Diagnosis not present

## 2018-01-07 DIAGNOSIS — N5201 Erectile dysfunction due to arterial insufficiency: Secondary | ICD-10-CM | POA: Diagnosis not present

## 2018-01-17 DIAGNOSIS — Z7984 Long term (current) use of oral hypoglycemic drugs: Secondary | ICD-10-CM | POA: Diagnosis not present

## 2018-01-17 DIAGNOSIS — H53002 Unspecified amblyopia, left eye: Secondary | ICD-10-CM | POA: Diagnosis not present

## 2018-01-17 DIAGNOSIS — E119 Type 2 diabetes mellitus without complications: Secondary | ICD-10-CM | POA: Diagnosis not present

## 2018-01-17 DIAGNOSIS — Z961 Presence of intraocular lens: Secondary | ICD-10-CM | POA: Diagnosis not present

## 2018-01-20 DIAGNOSIS — Z471 Aftercare following joint replacement surgery: Secondary | ICD-10-CM | POA: Diagnosis not present

## 2018-01-20 DIAGNOSIS — Z96641 Presence of right artificial hip joint: Secondary | ICD-10-CM | POA: Diagnosis not present

## 2018-01-20 DIAGNOSIS — M25561 Pain in right knee: Secondary | ICD-10-CM | POA: Diagnosis not present

## 2018-01-20 DIAGNOSIS — M25551 Pain in right hip: Secondary | ICD-10-CM | POA: Diagnosis not present

## 2018-02-27 ENCOUNTER — Other Ambulatory Visit: Payer: Self-pay | Admitting: Endocrinology

## 2018-02-28 DIAGNOSIS — Z7984 Long term (current) use of oral hypoglycemic drugs: Secondary | ICD-10-CM | POA: Diagnosis not present

## 2018-02-28 DIAGNOSIS — I10 Essential (primary) hypertension: Secondary | ICD-10-CM | POA: Diagnosis not present

## 2018-02-28 DIAGNOSIS — R05 Cough: Secondary | ICD-10-CM | POA: Diagnosis not present

## 2018-02-28 DIAGNOSIS — E119 Type 2 diabetes mellitus without complications: Secondary | ICD-10-CM | POA: Diagnosis not present

## 2018-02-28 DIAGNOSIS — E785 Hyperlipidemia, unspecified: Secondary | ICD-10-CM | POA: Diagnosis not present

## 2018-03-03 DIAGNOSIS — M25551 Pain in right hip: Secondary | ICD-10-CM | POA: Diagnosis not present

## 2018-03-04 DIAGNOSIS — Z96641 Presence of right artificial hip joint: Secondary | ICD-10-CM | POA: Diagnosis not present

## 2018-03-04 DIAGNOSIS — M25651 Stiffness of right hip, not elsewhere classified: Secondary | ICD-10-CM | POA: Diagnosis not present

## 2018-03-05 DIAGNOSIS — Z96641 Presence of right artificial hip joint: Secondary | ICD-10-CM | POA: Diagnosis not present

## 2018-03-05 DIAGNOSIS — M25651 Stiffness of right hip, not elsewhere classified: Secondary | ICD-10-CM | POA: Diagnosis not present

## 2018-03-07 DIAGNOSIS — Z96641 Presence of right artificial hip joint: Secondary | ICD-10-CM | POA: Diagnosis not present

## 2018-03-07 DIAGNOSIS — M25651 Stiffness of right hip, not elsewhere classified: Secondary | ICD-10-CM | POA: Diagnosis not present

## 2018-03-10 DIAGNOSIS — Z96641 Presence of right artificial hip joint: Secondary | ICD-10-CM | POA: Diagnosis not present

## 2018-03-10 DIAGNOSIS — M25651 Stiffness of right hip, not elsewhere classified: Secondary | ICD-10-CM | POA: Diagnosis not present

## 2018-03-12 DIAGNOSIS — Z96641 Presence of right artificial hip joint: Secondary | ICD-10-CM | POA: Diagnosis not present

## 2018-03-12 DIAGNOSIS — M25651 Stiffness of right hip, not elsewhere classified: Secondary | ICD-10-CM | POA: Diagnosis not present

## 2018-03-14 DIAGNOSIS — M25651 Stiffness of right hip, not elsewhere classified: Secondary | ICD-10-CM | POA: Diagnosis not present

## 2018-03-14 DIAGNOSIS — Z96641 Presence of right artificial hip joint: Secondary | ICD-10-CM | POA: Diagnosis not present

## 2018-03-20 DIAGNOSIS — M25651 Stiffness of right hip, not elsewhere classified: Secondary | ICD-10-CM | POA: Diagnosis not present

## 2018-03-20 DIAGNOSIS — Z96641 Presence of right artificial hip joint: Secondary | ICD-10-CM | POA: Diagnosis not present

## 2018-03-28 DIAGNOSIS — M25651 Stiffness of right hip, not elsewhere classified: Secondary | ICD-10-CM | POA: Diagnosis not present

## 2018-03-28 DIAGNOSIS — Z96641 Presence of right artificial hip joint: Secondary | ICD-10-CM | POA: Diagnosis not present

## 2018-03-29 ENCOUNTER — Other Ambulatory Visit: Payer: Self-pay | Admitting: Endocrinology

## 2018-04-01 DIAGNOSIS — Z96641 Presence of right artificial hip joint: Secondary | ICD-10-CM | POA: Diagnosis not present

## 2018-04-01 DIAGNOSIS — M25651 Stiffness of right hip, not elsewhere classified: Secondary | ICD-10-CM | POA: Diagnosis not present

## 2018-04-04 DIAGNOSIS — M25651 Stiffness of right hip, not elsewhere classified: Secondary | ICD-10-CM | POA: Diagnosis not present

## 2018-04-04 DIAGNOSIS — Z96641 Presence of right artificial hip joint: Secondary | ICD-10-CM | POA: Diagnosis not present

## 2018-04-07 DIAGNOSIS — F3131 Bipolar disorder, current episode depressed, mild: Secondary | ICD-10-CM | POA: Diagnosis not present

## 2018-04-08 DIAGNOSIS — M25651 Stiffness of right hip, not elsewhere classified: Secondary | ICD-10-CM | POA: Diagnosis not present

## 2018-04-08 DIAGNOSIS — Z96641 Presence of right artificial hip joint: Secondary | ICD-10-CM | POA: Diagnosis not present

## 2018-04-11 DIAGNOSIS — Z96641 Presence of right artificial hip joint: Secondary | ICD-10-CM | POA: Diagnosis not present

## 2018-04-11 DIAGNOSIS — M25651 Stiffness of right hip, not elsewhere classified: Secondary | ICD-10-CM | POA: Diagnosis not present

## 2018-04-14 DIAGNOSIS — M25551 Pain in right hip: Secondary | ICD-10-CM | POA: Diagnosis not present

## 2018-04-28 DIAGNOSIS — J0100 Acute maxillary sinusitis, unspecified: Secondary | ICD-10-CM | POA: Diagnosis not present

## 2018-05-06 DIAGNOSIS — R0981 Nasal congestion: Secondary | ICD-10-CM | POA: Diagnosis not present

## 2018-05-06 DIAGNOSIS — R05 Cough: Secondary | ICD-10-CM | POA: Diagnosis not present

## 2018-05-30 DIAGNOSIS — I10 Essential (primary) hypertension: Secondary | ICD-10-CM | POA: Diagnosis not present

## 2018-05-30 DIAGNOSIS — M150 Primary generalized (osteo)arthritis: Secondary | ICD-10-CM | POA: Diagnosis not present

## 2018-05-30 DIAGNOSIS — E785 Hyperlipidemia, unspecified: Secondary | ICD-10-CM | POA: Diagnosis not present

## 2018-05-30 DIAGNOSIS — E119 Type 2 diabetes mellitus without complications: Secondary | ICD-10-CM | POA: Diagnosis not present

## 2018-07-08 DIAGNOSIS — N410 Acute prostatitis: Secondary | ICD-10-CM | POA: Diagnosis not present

## 2018-07-08 DIAGNOSIS — Z125 Encounter for screening for malignant neoplasm of prostate: Secondary | ICD-10-CM | POA: Diagnosis not present

## 2018-07-31 DIAGNOSIS — Z96641 Presence of right artificial hip joint: Secondary | ICD-10-CM | POA: Diagnosis not present

## 2018-07-31 DIAGNOSIS — E119 Type 2 diabetes mellitus without complications: Secondary | ICD-10-CM | POA: Diagnosis not present

## 2018-07-31 DIAGNOSIS — Z471 Aftercare following joint replacement surgery: Secondary | ICD-10-CM | POA: Diagnosis not present

## 2018-08-04 DIAGNOSIS — M25511 Pain in right shoulder: Secondary | ICD-10-CM | POA: Diagnosis not present

## 2018-08-06 DIAGNOSIS — N410 Acute prostatitis: Secondary | ICD-10-CM | POA: Diagnosis not present

## 2018-08-06 DIAGNOSIS — N401 Enlarged prostate with lower urinary tract symptoms: Secondary | ICD-10-CM | POA: Diagnosis not present

## 2018-08-09 DIAGNOSIS — M25511 Pain in right shoulder: Secondary | ICD-10-CM | POA: Diagnosis not present

## 2018-08-18 DIAGNOSIS — M75111 Incomplete rotator cuff tear or rupture of right shoulder, not specified as traumatic: Secondary | ICD-10-CM | POA: Diagnosis not present

## 2018-08-19 DIAGNOSIS — M75111 Incomplete rotator cuff tear or rupture of right shoulder, not specified as traumatic: Secondary | ICD-10-CM | POA: Diagnosis not present

## 2018-08-20 DIAGNOSIS — M75111 Incomplete rotator cuff tear or rupture of right shoulder, not specified as traumatic: Secondary | ICD-10-CM | POA: Diagnosis not present

## 2018-08-26 DIAGNOSIS — M75111 Incomplete rotator cuff tear or rupture of right shoulder, not specified as traumatic: Secondary | ICD-10-CM | POA: Diagnosis not present

## 2018-08-28 DIAGNOSIS — M75111 Incomplete rotator cuff tear or rupture of right shoulder, not specified as traumatic: Secondary | ICD-10-CM | POA: Diagnosis not present

## 2018-09-02 DIAGNOSIS — M75111 Incomplete rotator cuff tear or rupture of right shoulder, not specified as traumatic: Secondary | ICD-10-CM | POA: Diagnosis not present

## 2018-09-02 DIAGNOSIS — N410 Acute prostatitis: Secondary | ICD-10-CM | POA: Diagnosis not present

## 2018-09-04 DIAGNOSIS — M75111 Incomplete rotator cuff tear or rupture of right shoulder, not specified as traumatic: Secondary | ICD-10-CM | POA: Diagnosis not present

## 2018-09-09 DIAGNOSIS — M75111 Incomplete rotator cuff tear or rupture of right shoulder, not specified as traumatic: Secondary | ICD-10-CM | POA: Diagnosis not present

## 2018-09-12 DIAGNOSIS — R531 Weakness: Secondary | ICD-10-CM | POA: Diagnosis not present

## 2018-09-12 DIAGNOSIS — Z7984 Long term (current) use of oral hypoglycemic drugs: Secondary | ICD-10-CM | POA: Diagnosis not present

## 2018-09-12 DIAGNOSIS — I10 Essential (primary) hypertension: Secondary | ICD-10-CM | POA: Diagnosis not present

## 2018-09-12 DIAGNOSIS — E119 Type 2 diabetes mellitus without complications: Secondary | ICD-10-CM | POA: Diagnosis not present

## 2018-09-15 DIAGNOSIS — F3131 Bipolar disorder, current episode depressed, mild: Secondary | ICD-10-CM | POA: Diagnosis not present

## 2018-09-16 DIAGNOSIS — R102 Pelvic and perineal pain: Secondary | ICD-10-CM | POA: Diagnosis not present

## 2018-09-16 DIAGNOSIS — N410 Acute prostatitis: Secondary | ICD-10-CM | POA: Diagnosis not present

## 2018-09-16 DIAGNOSIS — M62838 Other muscle spasm: Secondary | ICD-10-CM | POA: Diagnosis not present

## 2018-09-16 DIAGNOSIS — M6281 Muscle weakness (generalized): Secondary | ICD-10-CM | POA: Diagnosis not present

## 2018-09-16 DIAGNOSIS — M6289 Other specified disorders of muscle: Secondary | ICD-10-CM | POA: Diagnosis not present

## 2018-09-17 DIAGNOSIS — M75111 Incomplete rotator cuff tear or rupture of right shoulder, not specified as traumatic: Secondary | ICD-10-CM | POA: Diagnosis not present

## 2018-09-18 DIAGNOSIS — M75111 Incomplete rotator cuff tear or rupture of right shoulder, not specified as traumatic: Secondary | ICD-10-CM | POA: Diagnosis not present

## 2018-09-23 DIAGNOSIS — M75111 Incomplete rotator cuff tear or rupture of right shoulder, not specified as traumatic: Secondary | ICD-10-CM | POA: Diagnosis not present

## 2018-09-23 DIAGNOSIS — R5383 Other fatigue: Secondary | ICD-10-CM | POA: Diagnosis not present

## 2018-09-29 DIAGNOSIS — M75111 Incomplete rotator cuff tear or rupture of right shoulder, not specified as traumatic: Secondary | ICD-10-CM | POA: Diagnosis not present

## 2018-10-20 DIAGNOSIS — F3131 Bipolar disorder, current episode depressed, mild: Secondary | ICD-10-CM | POA: Diagnosis not present

## 2018-10-23 DIAGNOSIS — F3131 Bipolar disorder, current episode depressed, mild: Secondary | ICD-10-CM | POA: Diagnosis not present

## 2018-10-29 DIAGNOSIS — M75111 Incomplete rotator cuff tear or rupture of right shoulder, not specified as traumatic: Secondary | ICD-10-CM | POA: Diagnosis not present

## 2018-11-10 DIAGNOSIS — M25511 Pain in right shoulder: Secondary | ICD-10-CM | POA: Diagnosis not present

## 2018-11-10 DIAGNOSIS — G8929 Other chronic pain: Secondary | ICD-10-CM | POA: Diagnosis not present

## 2018-11-10 DIAGNOSIS — Z96652 Presence of left artificial knee joint: Secondary | ICD-10-CM | POA: Diagnosis not present

## 2018-11-10 DIAGNOSIS — M25512 Pain in left shoulder: Secondary | ICD-10-CM | POA: Diagnosis not present

## 2018-11-10 DIAGNOSIS — M7541 Impingement syndrome of right shoulder: Secondary | ICD-10-CM | POA: Diagnosis not present

## 2018-11-10 DIAGNOSIS — M19011 Primary osteoarthritis, right shoulder: Secondary | ICD-10-CM | POA: Diagnosis not present

## 2018-12-10 DIAGNOSIS — M47816 Spondylosis without myelopathy or radiculopathy, lumbar region: Secondary | ICD-10-CM | POA: Diagnosis not present

## 2018-12-10 DIAGNOSIS — G8929 Other chronic pain: Secondary | ICD-10-CM | POA: Diagnosis not present

## 2018-12-10 DIAGNOSIS — M4712 Other spondylosis with myelopathy, cervical region: Secondary | ICD-10-CM | POA: Diagnosis not present

## 2018-12-10 DIAGNOSIS — I10 Essential (primary) hypertension: Secondary | ICD-10-CM | POA: Diagnosis not present

## 2018-12-10 DIAGNOSIS — M47812 Spondylosis without myelopathy or radiculopathy, cervical region: Secondary | ICD-10-CM | POA: Diagnosis not present

## 2018-12-10 DIAGNOSIS — Z720 Tobacco use: Secondary | ICD-10-CM | POA: Diagnosis not present

## 2018-12-10 DIAGNOSIS — M8588 Other specified disorders of bone density and structure, other site: Secondary | ICD-10-CM | POA: Diagnosis not present

## 2018-12-10 DIAGNOSIS — Z96641 Presence of right artificial hip joint: Secondary | ICD-10-CM | POA: Diagnosis not present

## 2018-12-10 DIAGNOSIS — M5000 Cervical disc disorder with myelopathy, unspecified cervical region: Secondary | ICD-10-CM | POA: Diagnosis not present

## 2018-12-10 DIAGNOSIS — F319 Bipolar disorder, unspecified: Secondary | ICD-10-CM | POA: Diagnosis not present

## 2018-12-10 DIAGNOSIS — C801 Malignant (primary) neoplasm, unspecified: Secondary | ICD-10-CM | POA: Diagnosis not present

## 2018-12-10 DIAGNOSIS — E119 Type 2 diabetes mellitus without complications: Secondary | ICD-10-CM | POA: Diagnosis not present

## 2018-12-10 DIAGNOSIS — T847XXD Infection and inflammatory reaction due to other internal orthopedic prosthetic devices, implants and grafts, subsequent encounter: Secondary | ICD-10-CM | POA: Diagnosis not present

## 2018-12-10 DIAGNOSIS — N400 Enlarged prostate without lower urinary tract symptoms: Secondary | ICD-10-CM | POA: Diagnosis not present

## 2018-12-10 DIAGNOSIS — M1611 Unilateral primary osteoarthritis, right hip: Secondary | ICD-10-CM | POA: Diagnosis not present

## 2018-12-10 DIAGNOSIS — F419 Anxiety disorder, unspecified: Secondary | ICD-10-CM | POA: Diagnosis not present

## 2018-12-19 DIAGNOSIS — I10 Essential (primary) hypertension: Secondary | ICD-10-CM | POA: Diagnosis not present

## 2018-12-19 DIAGNOSIS — L219 Seborrheic dermatitis, unspecified: Secondary | ICD-10-CM | POA: Diagnosis not present

## 2018-12-19 DIAGNOSIS — G479 Sleep disorder, unspecified: Secondary | ICD-10-CM | POA: Diagnosis not present

## 2018-12-19 DIAGNOSIS — E119 Type 2 diabetes mellitus without complications: Secondary | ICD-10-CM | POA: Diagnosis not present

## 2018-12-19 DIAGNOSIS — M150 Primary generalized (osteo)arthritis: Secondary | ICD-10-CM | POA: Diagnosis not present

## 2018-12-19 DIAGNOSIS — E785 Hyperlipidemia, unspecified: Secondary | ICD-10-CM | POA: Diagnosis not present

## 2018-12-24 DIAGNOSIS — M1611 Unilateral primary osteoarthritis, right hip: Secondary | ICD-10-CM | POA: Diagnosis not present

## 2018-12-24 DIAGNOSIS — Z96641 Presence of right artificial hip joint: Secondary | ICD-10-CM | POA: Diagnosis not present

## 2018-12-24 DIAGNOSIS — M5137 Other intervertebral disc degeneration, lumbosacral region: Secondary | ICD-10-CM | POA: Diagnosis not present

## 2018-12-24 DIAGNOSIS — M47816 Spondylosis without myelopathy or radiculopathy, lumbar region: Secondary | ICD-10-CM | POA: Diagnosis not present

## 2018-12-24 DIAGNOSIS — M5136 Other intervertebral disc degeneration, lumbar region: Secondary | ICD-10-CM | POA: Diagnosis not present

## 2018-12-24 DIAGNOSIS — M76891 Other specified enthesopathies of right lower limb, excluding foot: Secondary | ICD-10-CM | POA: Diagnosis not present

## 2018-12-24 DIAGNOSIS — M48061 Spinal stenosis, lumbar region without neurogenic claudication: Secondary | ICD-10-CM | POA: Diagnosis not present

## 2018-12-24 DIAGNOSIS — M47817 Spondylosis without myelopathy or radiculopathy, lumbosacral region: Secondary | ICD-10-CM | POA: Diagnosis not present

## 2018-12-24 DIAGNOSIS — M4807 Spinal stenosis, lumbosacral region: Secondary | ICD-10-CM | POA: Diagnosis not present

## 2019-01-02 DIAGNOSIS — M150 Primary generalized (osteo)arthritis: Secondary | ICD-10-CM | POA: Diagnosis not present

## 2019-01-02 DIAGNOSIS — F331 Major depressive disorder, recurrent, moderate: Secondary | ICD-10-CM | POA: Diagnosis not present

## 2019-01-02 DIAGNOSIS — N289 Disorder of kidney and ureter, unspecified: Secondary | ICD-10-CM | POA: Diagnosis not present

## 2019-01-02 DIAGNOSIS — M7611 Psoas tendinitis, right hip: Secondary | ICD-10-CM | POA: Diagnosis not present

## 2019-01-02 DIAGNOSIS — M1611 Unilateral primary osteoarthritis, right hip: Secondary | ICD-10-CM | POA: Diagnosis not present

## 2019-01-16 DIAGNOSIS — R29898 Other symptoms and signs involving the musculoskeletal system: Secondary | ICD-10-CM | POA: Diagnosis not present

## 2019-01-16 DIAGNOSIS — M48061 Spinal stenosis, lumbar region without neurogenic claudication: Secondary | ICD-10-CM | POA: Diagnosis not present

## 2019-01-19 DIAGNOSIS — F3131 Bipolar disorder, current episode depressed, mild: Secondary | ICD-10-CM | POA: Diagnosis not present

## 2019-01-20 DIAGNOSIS — F3131 Bipolar disorder, current episode depressed, mild: Secondary | ICD-10-CM | POA: Diagnosis not present

## 2019-01-20 DIAGNOSIS — M150 Primary generalized (osteo)arthritis: Secondary | ICD-10-CM | POA: Diagnosis not present

## 2019-01-20 DIAGNOSIS — F310 Bipolar disorder, current episode hypomanic: Secondary | ICD-10-CM | POA: Diagnosis not present

## 2019-01-20 DIAGNOSIS — I10 Essential (primary) hypertension: Secondary | ICD-10-CM | POA: Diagnosis not present

## 2019-01-20 DIAGNOSIS — E119 Type 2 diabetes mellitus without complications: Secondary | ICD-10-CM | POA: Diagnosis not present

## 2019-01-27 ENCOUNTER — Encounter (HOSPITAL_COMMUNITY): Payer: Self-pay

## 2019-01-27 ENCOUNTER — Other Ambulatory Visit: Payer: Self-pay

## 2019-01-27 ENCOUNTER — Emergency Department (HOSPITAL_COMMUNITY)
Admission: EM | Admit: 2019-01-27 | Discharge: 2019-01-28 | Disposition: A | Discharge status: Home / Self Care | Payer: Medicare PPO | Attending: Emergency Medicine | Admitting: Emergency Medicine

## 2019-01-27 DIAGNOSIS — R200 Anesthesia of skin: Secondary | ICD-10-CM | POA: Diagnosis not present

## 2019-01-27 DIAGNOSIS — Z96653 Presence of artificial knee joint, bilateral: Secondary | ICD-10-CM | POA: Insufficient documentation

## 2019-01-27 DIAGNOSIS — Z79899 Other long term (current) drug therapy: Secondary | ICD-10-CM | POA: Insufficient documentation

## 2019-01-27 DIAGNOSIS — Z8549 Personal history of malignant neoplasm of other male genital organs: Secondary | ICD-10-CM | POA: Insufficient documentation

## 2019-01-27 DIAGNOSIS — I10 Essential (primary) hypertension: Secondary | ICD-10-CM | POA: Diagnosis not present

## 2019-01-27 DIAGNOSIS — E119 Type 2 diabetes mellitus without complications: Secondary | ICD-10-CM | POA: Insufficient documentation

## 2019-01-27 DIAGNOSIS — F17210 Nicotine dependence, cigarettes, uncomplicated: Secondary | ICD-10-CM | POA: Diagnosis not present

## 2019-01-27 DIAGNOSIS — R202 Paresthesia of skin: Secondary | ICD-10-CM | POA: Diagnosis not present

## 2019-01-27 LAB — CBG MONITORING, ED: Glucose-Capillary: 127 mg/dL — ABNORMAL HIGH (ref 70–99)

## 2019-01-27 NOTE — ED Provider Notes (Signed)
[facility]-EMERGENCY DEPT Provider Note   CSN: [id number] Arrival date & time: 01/27/19  2208     History   Chief Complaint Chief Complaint  Patient presents with  . Numbness  . Leg Pain    HPI Dwayne Vargas is a 73 y.o. male.     HPI Patient presents to the emergency room for evaluation of leg numbness and weakness.  Patient states he has been having issues since 2019 after hip operation.  Patient states he had a complicated course that required extensive rehab.  Patient states he had been doing better and has been walking about a mile and a half.  More recently he started developing a burning pain going down his right leg.  He also felt weakness in his right leg.  Patient [facility].  Patient was concerned because this evening he started developing numbness in his left toes up to the midfoot.  He is not having any significant pain.  He is not having any fevers or chills. Past Medical History:  Diagnosis Date  . Anxiety   . Benign localized prostatic hyperplasia with lower urinary tract symptoms (LUTS)   . Bipolar 1 disorder (HCC)   . Complication of anesthesia    limited neck motion due to cervical spine surgery  . Depression   . ED (erectile dysfunction) of organic origin   . History of penile cancer    s/p  excision of cancer 2013  . HOH (hard of hearing)    left ear  . Hypertension   . Irritable bowel    intermittant diarrhea  secondary to antibiotics  . Legally blind in left eye, as defined in [place]   . Stricture, prostate    urethra  . Type 2 diabetes mellitus (HCC)   . Weak urinary stream    intermittant    Patient Active Problem List   Diagnosis Date Noted  . Osteoarthritis of right hip 09/01/2017  . Hyperlipidemia associated with type 2 diabetes mellitus (HCC) 09/01/2017  . Bipolar disorder (HCC) 09/01/2017  . Uncontrolled type 2 diabetes mellitus with hyperglycemia, without long-term current use of  insulin (HCC) 04/17/2016  . Diabetic peripheral neuropathy associated with type 2 diabetes mellitus (HCC) 04/17/2016  . BPH (benign prostatic hyperplasia) 07/22/2015  . Empyema (HCC) 08/12/2014  . Diabetes mellitus without complication (HCC) 08/10/2014  . Community acquired pneumonia 08/10/2014  . Pleural effusion 08/10/2014  . HTN (hypertension) 08/10/2014  . Pneumonia 08/10/2014  . CAP (community acquired pneumonia) 08/10/2014    Past Surgical History:  Procedure Laterality Date  . CATARACT EXTRACTION W/ INTRAOCULAR LENS  IMPLANT, BILATERAL  2007  . CERVICAL FUSION  05/ 2016   [facility]   C2 -- C4  . CHOLECYSTECTOMY  2013  approx  . EXCISION PENILE CANCER  2013  . NASAL SEPTUM SURGERY  x2  last one 1990's  . SHOULDER ARTHROSCOPY WITH ROTATOR CUFF REPAIR Left 1990's  . TOTAL KNEE ARTHROPLASTY Bilateral left 2007/  right and revision in 2009  . TRANSURETHRAL INCISION OF PROSTATE N/A 11/21/2015   Procedure: TRANSURETHRAL INCISION OF THE PROSTATE (TUIP);  Surgeon: Hildred LaserBrian James Budzyn, MD;  [facility];  Service: Urology;  Laterality: N/A;  . TRANSURETHRAL RESECTION OF PROSTATE N/A 07/22/2015   Procedure: TRANSURETHRAL RESECTION OF THE PROSTATE ;  Surgeon: Hildred LaserBrian James Budzyn, MD;  Location: [facility];  Service: Urology;  Laterality: N/A;  . VIDEO ASSISTED THORACOSCOPY (VATS)/THOROCOTOMY Left 08/12/2014   Procedure: VIDEO  ASSISTED THORACOSCOPY (VATS)/THOROCOTOMY, DRAINAGE OF EMPYEMA;  Surgeon: Gaye Pollack, MD;  Location: [facility];  Service: Thoracic;  Laterality: Left;        Home Medications    Prior to Admission medications   Medication Sig Start Date End Date Taking? Authorizing Provider  acetaminophen (TYLENOL) 500 MG tablet Take 500 mg by mouth every 6 (six) hours as needed for moderate pain.   Yes [provider]  gabapentin (NEURONTIN) 100 MG capsule Take 100 mg by mouth at bedtime. 01/26/19  Yes [provider]  glipiZIDE (GLUCOTROL XL) 5 MG  24 hr tablet Take 1 tablet (5 mg total) by mouth daily with breakfast. 06/05/17  Yes Elayne Snare, MD  JANUVIA 100 MG tablet TAKE 1 TABLET BY MOUTH EVERY DAY Patient taking differently: Take 50 mg by mouth daily.  02/27/18  Yes Elayne Snare, MD  lamoTRIgine (LAMICTAL) 200 MG tablet Take 200 mg by mouth at bedtime. 07/29/14  Yes [provider]  losartan (COZAAR) 25 MG tablet Take 25 mg by mouth at bedtime.  08/10/14  Yes [provider]  Melatonin 5 MG TABS Take 1 tablet by mouth at bedtime.   Yes [provider]  meloxicam (MOBIC) 15 MG tablet Take 15 mg by mouth daily as needed for muscle spasms. 12/12/18  Yes [provider]  metoprolol tartrate (LOPRESSOR) 25 MG tablet Take 1 tablet (25 mg total) by mouth 2 (two) times daily. 08/18/14  Yes Lars Pinks M, PA-C  Multiple Vitamin (MULTIVITAMIN WITH MINERALS) TABS tablet Take 1 tablet by mouth daily.   Yes [provider]  rosuvastatin (CRESTOR) 10 MG tablet Take 10 mg by mouth every evening.    Yes [provider]  traMADol (ULTRAM) 50 MG tablet Take 50-100 mg by mouth 2 (two) times daily as needed for pain. 01/26/19  Yes [provider]  traZODone (DESYREL) 50 MG tablet Take 100 mg by mouth at bedtime.    Yes [provider]  ziprasidone (GEODON) 40 MG capsule Take 40 mg by mouth at bedtime. 01/19/19  Yes [provider]    Family History Family History  Problem Relation Age of Onset  . Diabetes Cousin     Social History Social History   Tobacco Use  . Smoking status: Current Every Day Smoker    Packs/day: 1.00    Years: 53.00    Pack years: 53.00    Types: Cigarettes  . Smokeless tobacco: Never Used  Substance Use Topics  . Alcohol use: No    Alcohol/week: 0.0 standard drinks  . Drug use: No     Allergies   Aspirin, Contrast media [iodinated diagnostic agents], Fentanyl, Morphine and related, Oxycontin [oxycodone hcl], Shellfish allergy, and  Diazepam   Review of Systems Review of Systems  All other systems reviewed and are negative.    Physical Exam Updated Vital Signs BP (!) 182/97 (BP Location: Left Arm)   Pulse 81   Temp 98 F (36.7 C) (Oral)   Resp 16   Ht 1.778 m (5\' 10" )   Wt 92.1 kg   SpO2 98%   BMI 29.13 kg/m   Physical Exam Vitals signs and nursing note reviewed.  Constitutional:      General: He is not in acute distress.    Appearance: He is well-developed.  HENT:     Head: Normocephalic and atraumatic.     Right Ear: External ear normal.     Left Ear: External ear normal.  Eyes:  General: No scleral icterus.       Right eye: No discharge.        Left eye: No discharge.     Conjunctiva/sclera: Conjunctivae normal.  Neck:     Musculoskeletal: Neck supple.     Trachea: No tracheal deviation.  Cardiovascular:     Rate and Rhythm: Normal rate.  Pulmonary:     Effort: Pulmonary effort is normal. No respiratory distress.     Breath sounds: No stridor.  Abdominal:     General: There is no distension.  Musculoskeletal:        General: No swelling, tenderness or deformity.     Comments: Strong dorsalis pedis pulse on the right, weaker dorsalis pedis on the left but strong posterior tibial on the left, termination warm well perfused, no cyanosis  Skin:    General: Skin is warm and dry.     Findings: No rash.  Neurological:     Mental Status: He is alert.     Cranial Nerves: Cranial nerve deficit: no gross deficits.     Comments: 5 out of 5 plantar and dorsiflexion study left lower extremity, 4 out of 5 plantar and dorsiflexion on the right side; 2+ patellar reflexes bilaterally, sensation light touch is intact lower extremities bilaterally      ED Treatments / Results  Labs (all labs ordered are listed, but only abnormal results are displayed) Labs Reviewed  CBG MONITORING, ED - Abnormal; Notable for the following components:      Result Value   Glucose-Capillary 127 (*)    All other  components within normal limits    EKG None  Radiology No results found. Imaging test reviewed from [facility].  Study was performed on October 29.  Patient was noted to have mild broad-based disc bulge with mild osteophyte formation at L4-L5.  Mild to moderate canal stenosis.  Moderate bilateral foraminal stenosis.  Mild diffuse disc bulge at L5-S1 moderate right and mild left foraminal stenosis. Procedures Procedures (including critical care time)  Medications Ordered in ED Medications - No data to display   Initial Impression / Assessment and Plan / ED Course  I have reviewed the triage vital signs and the nursing notes.  Pertinent labs & imaging results that were available during my care of the patient were reviewed by me and considered in my medical decision making (see chart for details).   Patient's physical exam is reassuring.  He has good strength other than some weakness in his right lower extremity.  Reviewed previous records and this is not new.  He has strong pulses no signs of any vascular compromise.  No signs of infection.  His symptoms may be related to his known lumbar spine disc disease.  I think he stable to follow-up with his spine doctor and primary care doctor.  Final Clinical Impressions(s) / ED Diagnoses   Final diagnoses:  Paresthesia  Hypertension, unspecified type    ED Discharge Orders    None       Linwood Dibbles, MD 01/27/19 2327

## 2019-01-27 NOTE — Discharge Instructions (Signed)
Follow-up with your spine doctor and primary care doctor as we discussed.  Continue your current medication.

## 2019-01-27 NOTE — ED Triage Notes (Signed)
Pt reports new onset numbness in left toes. Pt has chronic right leg numbness and pain. Pt reports extensive surgeries to both legs, hips, and back. Pt has hx of diabetes.

## 2019-02-24 DIAGNOSIS — M5136 Other intervertebral disc degeneration, lumbar region: Secondary | ICD-10-CM | POA: Diagnosis not present

## 2019-02-24 DIAGNOSIS — M48061 Spinal stenosis, lumbar region without neurogenic claudication: Secondary | ICD-10-CM | POA: Diagnosis not present

## 2019-03-05 DIAGNOSIS — G2589 Other specified extrapyramidal and movement disorders: Secondary | ICD-10-CM | POA: Diagnosis not present

## 2019-03-05 DIAGNOSIS — E119 Type 2 diabetes mellitus without complications: Secondary | ICD-10-CM | POA: Diagnosis not present

## 2019-03-05 DIAGNOSIS — M7541 Impingement syndrome of right shoulder: Secondary | ICD-10-CM | POA: Diagnosis not present

## 2019-03-17 DIAGNOSIS — Z23 Encounter for immunization: Secondary | ICD-10-CM | POA: Diagnosis not present

## 2019-03-17 DIAGNOSIS — I10 Essential (primary) hypertension: Secondary | ICD-10-CM | POA: Diagnosis not present

## 2019-03-17 DIAGNOSIS — F331 Major depressive disorder, recurrent, moderate: Secondary | ICD-10-CM | POA: Diagnosis not present

## 2019-03-17 DIAGNOSIS — N400 Enlarged prostate without lower urinary tract symptoms: Secondary | ICD-10-CM | POA: Diagnosis not present

## 2019-03-17 DIAGNOSIS — Z0000 Encounter for general adult medical examination without abnormal findings: Secondary | ICD-10-CM | POA: Diagnosis not present

## 2019-03-17 DIAGNOSIS — M150 Primary generalized (osteo)arthritis: Secondary | ICD-10-CM | POA: Diagnosis not present

## 2019-03-17 DIAGNOSIS — E119 Type 2 diabetes mellitus without complications: Secondary | ICD-10-CM | POA: Diagnosis not present

## 2019-03-17 DIAGNOSIS — E785 Hyperlipidemia, unspecified: Secondary | ICD-10-CM | POA: Diagnosis not present

## 2019-03-17 DIAGNOSIS — Z125 Encounter for screening for malignant neoplasm of prostate: Secondary | ICD-10-CM | POA: Diagnosis not present

## 2019-03-17 DIAGNOSIS — H9312 Tinnitus, left ear: Secondary | ICD-10-CM | POA: Diagnosis not present

## 2019-03-30 ENCOUNTER — Encounter (INDEPENDENT_AMBULATORY_CARE_PROVIDER_SITE_OTHER): Payer: Self-pay | Admitting: Otolaryngology

## 2019-03-30 ENCOUNTER — Other Ambulatory Visit: Payer: Self-pay

## 2019-03-30 ENCOUNTER — Ambulatory Visit (INDEPENDENT_AMBULATORY_CARE_PROVIDER_SITE_OTHER): Payer: Medicare PPO | Admitting: Otolaryngology

## 2019-03-30 VITALS — Temp 97.2°F

## 2019-03-30 DIAGNOSIS — H9042 Sensorineural hearing loss, unilateral, left ear, with unrestricted hearing on the contralateral side: Secondary | ICD-10-CM | POA: Diagnosis not present

## 2019-03-30 DIAGNOSIS — H6123 Impacted cerumen, bilateral: Secondary | ICD-10-CM | POA: Diagnosis not present

## 2019-03-30 DIAGNOSIS — H9312 Tinnitus, left ear: Secondary | ICD-10-CM | POA: Diagnosis not present

## 2019-03-30 DIAGNOSIS — H903 Sensorineural hearing loss, bilateral: Secondary | ICD-10-CM | POA: Diagnosis not present

## 2019-03-30 NOTE — Progress Notes (Signed)
HPI: Dwayne Vargas is a 74 y.o. male who presents is referred by [facility] for evaluation of asymmetric SNHL.  Patient has noted worse hearing on the left side for the past 3 to 5 years.  He feels like is gotten a little bit worse.  He underwent audiologic testing with [facility] that demonstrated a moderate severe left ear SNHL of approximately 70 DB and right ear hearing of approximately 35 DB.  He had type a tympanograms bilaterally.  Past Medical History:  Diagnosis Date  . Anxiety   . Benign localized prostatic hyperplasia with lower urinary tract symptoms (LUTS)   . Bipolar 1 disorder (HCC)   . Complication of anesthesia    limited neck motion due to cervical spine surgery  . Depression   . ED (erectile dysfunction) of organic origin   . History of penile cancer    s/p  excision of cancer 2013  . HOH (hard of hearing)    left ear  . Hypertension   . Irritable bowel    intermittant diarrhea  secondary to antibiotics  . Legally blind in left eye, as defined in [place]   . Stricture, prostate    urethra  . Type 2 diabetes mellitus (HCC)   . Weak urinary stream    intermittant   Past Surgical History:  Procedure Laterality Date  . CATARACT EXTRACTION W/ INTRAOCULAR LENS  IMPLANT, BILATERAL  2007  . CERVICAL FUSION  05/ 2016   [facility]   C2 -- C4  . CHOLECYSTECTOMY  2013  approx  . EXCISION PENILE CANCER  2013  . NASAL SEPTUM SURGERY  x2  last one 1990's  . SHOULDER ARTHROSCOPY WITH ROTATOR CUFF REPAIR Left 1990's  . TOTAL KNEE ARTHROPLASTY Bilateral left 2007/  right and revision in 2009  . TRANSURETHRAL INCISION OF PROSTATE N/A 11/21/2015   Procedure: TRANSURETHRAL INCISION OF THE PROSTATE (TUIP);  Surgeon: Hildred Laser, MD;  Location: [facility];  Service: Urology;  Laterality: N/A;  . TRANSURETHRAL RESECTION OF PROSTATE N/A 07/22/2015   Procedure: TRANSURETHRAL RESECTION OF THE PROSTATE ;  Surgeon: Hildred Laser, MD;  Location: [facility];  Service:  Urology;  Laterality: N/A;  . VIDEO ASSISTED THORACOSCOPY (VATS)/THOROCOTOMY Left 08/12/2014   Procedure: VIDEO ASSISTED THORACOSCOPY (VATS)/THOROCOTOMY, DRAINAGE OF EMPYEMA;  Surgeon: Alleen Borne, MD;  Location: [facility];  Service: Thoracic;  Laterality: Left;   Social History   Socioeconomic History  . Marital status: Widowed    Spouse name: Not on file  . Number of children: Not on file  . Years of education: Not on file  . Highest education level: Not on file  Occupational History  . Not on file  Tobacco Use  . Smoking status: Former Smoker    Packs/day: 1.00    Years: 53.00    Pack years: 53.00    Types: Cigarettes    Quit date: 2019    Years since quitting: 2.0  . Smokeless tobacco: Never Used  Substance and Sexual Activity  . Alcohol use: No    Alcohol/week: 0.0 standard drinks  . Drug use: No  . Sexual activity: Never  Other Topics Concern  . Not on file  Social History Narrative  . Not on file   Social Determinants of Health   Financial Resource Strain:   . Difficulty of Paying Living Expenses: Not on file  Food Insecurity:   . Worried About Programme researcher, broadcasting/film/video in the Last Year: Not on file  .  Ran Out of Food in the Last Year: Not on file  Transportation Needs:   . Lack of Transportation (Medical): Not on file  . Lack of Transportation (Non-Medical): Not on file  Physical Activity:   . Days of Exercise per Week: Not on file  . Minutes of Exercise per Session: Not on file  Stress:   . Feeling of Stress : Not on file  Social Connections:   . Frequency of Communication with Friends and Family: Not on file  . Frequency of Social Gatherings with Friends and Family: Not on file  . Attends Religious Services: Not on file  . Active Member of Clubs or Organizations: Not on file  . Attends Archivist Meetings: Not on file  . Marital Status: Not on file   Family History  Problem Relation Age of Onset  . Diabetes Cousin    Allergies  Allergen  Reactions  . Aspirin Other (See Comments)    GI upset  . Contrast Media [Iodinated Diagnostic Agents] Hives and Swelling  . Fentanyl     Pt wishes not to have  . Morphine And Related Other (See Comments)    Tremors   . Oxycontin [Oxycodone Hcl] Other (See Comments)    "pt does not wish to have"  . Shellfish Allergy Hives    All fish and shellfish w/ iodine  . Diazepam Other (See Comments)    Patient does not want to use   Prior to Admission medications   Medication Sig Start Date End Date Taking? Authorizing Provider  acetaminophen (TYLENOL) 500 MG tablet Take 500 mg by mouth every 6 (six) hours as needed for moderate pain.   Yes [provider]  gabapentin (NEURONTIN) 100 MG capsule Take 100 mg by mouth at bedtime. 01/26/19  Yes [provider]  glipiZIDE (GLUCOTROL XL) 5 MG 24 hr tablet Take 1 tablet (5 mg total) by mouth daily with breakfast. 06/05/17  Yes Elayne Snare, MD  JANUVIA 100 MG tablet TAKE 1 TABLET BY MOUTH EVERY DAY Patient taking differently: Take 50 mg by mouth daily.  02/27/18  Yes Elayne Snare, MD  lamoTRIgine (LAMICTAL) 200 MG tablet Take 200 mg by mouth at bedtime. 07/29/14  Yes [provider]  losartan (COZAAR) 25 MG tablet Take 25 mg by mouth at bedtime.  08/10/14  Yes [provider]  Melatonin 5 MG TABS Take 1 tablet by mouth at bedtime.   Yes [provider]  meloxicam (MOBIC) 15 MG tablet Take 15 mg by mouth daily as needed for muscle spasms. 12/12/18  Yes [provider]  metoprolol tartrate (LOPRESSOR) 25 MG tablet Take 1 tablet (25 mg total) by mouth 2 (two) times daily. 08/18/14  Yes Lars Pinks M, PA-C  Multiple Vitamin (MULTIVITAMIN WITH MINERALS) TABS tablet Take 1 tablet by mouth daily.   Yes [provider]  rosuvastatin (CRESTOR) 10 MG tablet Take 10 mg by mouth every evening.    Yes [provider]  traMADol (ULTRAM) 50 MG tablet Take 50-100 mg by mouth 2 (two) times daily as  needed for pain. 01/26/19  Yes [provider]  traZODone (DESYREL) 50 MG tablet Take 100 mg by mouth at bedtime.    Yes [provider]  ziprasidone (GEODON) 40 MG capsule Take 40 mg by mouth at bedtime. 01/19/19  Yes [provider]     Positive ROS: Otherwise negative.  No headaches no dizziness and vertigo.  All other systems have been reviewed and were otherwise  negative with the exception of those mentioned in the HPI and as above.  Physical Exam: Constitutional: Alert, well-appearing, no acute distress Ears: External ears without lesions or tenderness. Ear canals with minimal wax buildup that was cleaned with curette.  TMs were clear bilaterally. Nasal: External nose without lesions. Septum with mild deformity.  Moderate rhinitis bilaterally with clear discharge.. Clear nasal passages with no polyps. Oral: Lips and gums without lesions. Tongue and palate mucosa without lesions. Posterior oropharynx clear. Neck: No palpable adenopathy or masses Respiratory: Breathing comfortably  Skin: No facial/neck lesions or rash noted.  Cerumen impaction removal  Date/Time: 03/30/2019 6:17 PM Performed by: Drema Halon, MD Authorized by: Drema Halon, MD   Consent:    Consent obtained:  Verbal   Consent given by:  Patient   Risks discussed:  Pain and bleeding Procedure details:    Location:  L ear and R ear   Procedure type: curette   Post-procedure details:    Inspection:  TM intact and canal normal   Hearing quality:  Improved   Patient tolerance of procedure:  Tolerated well, no immediate complications Comments:     Clear TMs bilaterally.    Assessment: Asymmetric SNHL much worse in the left ear which has been gradual over the past 5 years Minimal wax buildup  Plan: We will plan on scheduling an MRI scan of the temporal bone to rule out cochlear or retrocochlear pathology. Patient will call us concerning the results following the  MRI scan with gadolinium I would recommend going ahead with obtaining a hearing aid for the left ear.   Narda Bonds, MD   CC:

## 2019-04-02 ENCOUNTER — Other Ambulatory Visit (INDEPENDENT_AMBULATORY_CARE_PROVIDER_SITE_OTHER): Payer: Self-pay

## 2019-04-02 ENCOUNTER — Encounter (INDEPENDENT_AMBULATORY_CARE_PROVIDER_SITE_OTHER): Payer: Self-pay

## 2019-04-02 DIAGNOSIS — M4807 Spinal stenosis, lumbosacral region: Secondary | ICD-10-CM | POA: Diagnosis not present

## 2019-04-02 DIAGNOSIS — R29898 Other symptoms and signs involving the musculoskeletal system: Secondary | ICD-10-CM | POA: Diagnosis not present

## 2019-04-02 DIAGNOSIS — H905 Unspecified sensorineural hearing loss: Secondary | ICD-10-CM

## 2019-04-02 DIAGNOSIS — Z96641 Presence of right artificial hip joint: Secondary | ICD-10-CM | POA: Diagnosis not present

## 2019-04-03 ENCOUNTER — Encounter (INDEPENDENT_AMBULATORY_CARE_PROVIDER_SITE_OTHER): Payer: Self-pay

## 2019-04-14 DIAGNOSIS — M4807 Spinal stenosis, lumbosacral region: Secondary | ICD-10-CM | POA: Diagnosis not present

## 2019-04-14 DIAGNOSIS — E119 Type 2 diabetes mellitus without complications: Secondary | ICD-10-CM | POA: Diagnosis not present

## 2019-04-14 DIAGNOSIS — M47816 Spondylosis without myelopathy or radiculopathy, lumbar region: Secondary | ICD-10-CM | POA: Diagnosis not present

## 2019-04-14 DIAGNOSIS — M4306 Spondylolysis, lumbar region: Secondary | ICD-10-CM | POA: Diagnosis not present

## 2019-04-14 DIAGNOSIS — M48061 Spinal stenosis, lumbar region without neurogenic claudication: Secondary | ICD-10-CM | POA: Diagnosis not present

## 2019-04-14 DIAGNOSIS — M8588 Other specified disorders of bone density and structure, other site: Secondary | ICD-10-CM | POA: Diagnosis not present

## 2019-04-14 DIAGNOSIS — M48062 Spinal stenosis, lumbar region with neurogenic claudication: Secondary | ICD-10-CM | POA: Diagnosis not present

## 2019-04-14 DIAGNOSIS — Z96641 Presence of right artificial hip joint: Secondary | ICD-10-CM | POA: Diagnosis not present

## 2019-04-20 ENCOUNTER — Ambulatory Visit
Admission: RE | Admit: 2019-04-20 | Discharge: 2019-04-20 | Disposition: A | Discharge status: Home / Self Care | Payer: Medicare PPO | Source: Ambulatory Visit | Attending: Otolaryngology | Admitting: Otolaryngology

## 2019-04-20 ENCOUNTER — Other Ambulatory Visit: Payer: Self-pay

## 2019-04-20 ENCOUNTER — Other Ambulatory Visit: Payer: Self-pay | Admitting: Orthopedic Surgery

## 2019-04-20 DIAGNOSIS — E119 Type 2 diabetes mellitus without complications: Secondary | ICD-10-CM

## 2019-04-20 DIAGNOSIS — M4801 Spinal stenosis, occipito-atlanto-axial region: Secondary | ICD-10-CM

## 2019-04-20 DIAGNOSIS — H905 Unspecified sensorineural hearing loss: Secondary | ICD-10-CM

## 2019-04-20 DIAGNOSIS — H9042 Sensorineural hearing loss, unilateral, left ear, with unrestricted hearing on the contralateral side: Secondary | ICD-10-CM | POA: Diagnosis not present

## 2019-04-20 MED ORDER — GADOBENATE DIMEGLUMINE 529 MG/ML IV SOLN
19.0000 mL | Freq: Once | INTRAVENOUS | Status: AC | PRN
  Administered 2019-04-20: 19 mL via INTRAVENOUS

## 2019-04-24 ENCOUNTER — Other Ambulatory Visit (INDEPENDENT_AMBULATORY_CARE_PROVIDER_SITE_OTHER): Payer: Self-pay | Admitting: Otolaryngology

## 2019-04-24 ENCOUNTER — Telehealth (INDEPENDENT_AMBULATORY_CARE_PROVIDER_SITE_OTHER): Payer: Self-pay | Admitting: Otolaryngology

## 2019-04-24 DIAGNOSIS — R3914 Feeling of incomplete bladder emptying: Secondary | ICD-10-CM | POA: Diagnosis not present

## 2019-04-24 DIAGNOSIS — N401 Enlarged prostate with lower urinary tract symptoms: Secondary | ICD-10-CM | POA: Diagnosis not present

## 2019-04-24 DIAGNOSIS — M6289 Other specified disorders of muscle: Secondary | ICD-10-CM | POA: Diagnosis not present

## 2019-04-24 DIAGNOSIS — R3912 Poor urinary stream: Secondary | ICD-10-CM | POA: Diagnosis not present

## 2019-04-24 NOTE — Telephone Encounter (Signed)
Discussed with Dwayne Vargas today concerning results of his MRI scan.  This showed no evidence of retrocochlear pathology. I would recommend proceeding with obtaining hearing aids.

## 2019-04-29 ENCOUNTER — Other Ambulatory Visit: Payer: Self-pay

## 2019-04-29 ENCOUNTER — Ambulatory Visit
Admission: RE | Admit: 2019-04-29 | Discharge: 2019-04-29 | Disposition: A | Discharge status: Home / Self Care | Payer: Medicare PPO | Source: Ambulatory Visit | Attending: Orthopedic Surgery | Admitting: Orthopedic Surgery

## 2019-04-29 DIAGNOSIS — E119 Type 2 diabetes mellitus without complications: Secondary | ICD-10-CM

## 2019-04-29 DIAGNOSIS — M4801 Spinal stenosis, occipito-atlanto-axial region: Secondary | ICD-10-CM

## 2019-04-29 DIAGNOSIS — M545 Low back pain: Secondary | ICD-10-CM | POA: Diagnosis not present

## 2019-05-01 DIAGNOSIS — R29898 Other symptoms and signs involving the musculoskeletal system: Secondary | ICD-10-CM | POA: Diagnosis not present

## 2019-05-12 DIAGNOSIS — N5201 Erectile dysfunction due to arterial insufficiency: Secondary | ICD-10-CM | POA: Diagnosis not present

## 2019-05-28 DIAGNOSIS — Z96651 Presence of right artificial knee joint: Secondary | ICD-10-CM | POA: Diagnosis not present

## 2019-05-28 DIAGNOSIS — F319 Bipolar disorder, unspecified: Secondary | ICD-10-CM | POA: Diagnosis not present

## 2019-05-28 DIAGNOSIS — M48062 Spinal stenosis, lumbar region with neurogenic claudication: Secondary | ICD-10-CM | POA: Diagnosis not present

## 2019-05-28 DIAGNOSIS — Z720 Tobacco use: Secondary | ICD-10-CM | POA: Diagnosis not present

## 2019-05-28 DIAGNOSIS — Z01818 Encounter for other preprocedural examination: Secondary | ICD-10-CM | POA: Diagnosis not present

## 2019-05-28 DIAGNOSIS — G4730 Sleep apnea, unspecified: Secondary | ICD-10-CM | POA: Diagnosis not present

## 2019-05-28 DIAGNOSIS — E119 Type 2 diabetes mellitus without complications: Secondary | ICD-10-CM | POA: Diagnosis not present

## 2019-05-28 DIAGNOSIS — M48061 Spinal stenosis, lumbar region without neurogenic claudication: Secondary | ICD-10-CM | POA: Diagnosis not present

## 2019-05-28 DIAGNOSIS — I10 Essential (primary) hypertension: Secondary | ICD-10-CM | POA: Diagnosis not present

## 2019-05-28 DIAGNOSIS — N400 Enlarged prostate without lower urinary tract symptoms: Secondary | ICD-10-CM | POA: Diagnosis not present

## 2019-06-02 DIAGNOSIS — M5116 Intervertebral disc disorders with radiculopathy, lumbar region: Secondary | ICD-10-CM | POA: Diagnosis not present

## 2019-06-02 DIAGNOSIS — M48061 Spinal stenosis, lumbar region without neurogenic claudication: Secondary | ICD-10-CM | POA: Diagnosis not present

## 2019-06-02 DIAGNOSIS — M5416 Radiculopathy, lumbar region: Secondary | ICD-10-CM | POA: Diagnosis not present

## 2019-06-22 DIAGNOSIS — R296 Repeated falls: Secondary | ICD-10-CM | POA: Diagnosis not present

## 2019-06-22 DIAGNOSIS — E119 Type 2 diabetes mellitus without complications: Secondary | ICD-10-CM | POA: Diagnosis not present

## 2019-06-22 DIAGNOSIS — R2689 Other abnormalities of gait and mobility: Secondary | ICD-10-CM | POA: Diagnosis not present

## 2019-06-22 DIAGNOSIS — F319 Bipolar disorder, unspecified: Secondary | ICD-10-CM | POA: Diagnosis not present

## 2019-06-22 DIAGNOSIS — R262 Difficulty in walking, not elsewhere classified: Secondary | ICD-10-CM | POA: Diagnosis not present

## 2019-06-22 DIAGNOSIS — M6281 Muscle weakness (generalized): Secondary | ICD-10-CM | POA: Diagnosis not present

## 2019-06-22 DIAGNOSIS — E785 Hyperlipidemia, unspecified: Secondary | ICD-10-CM | POA: Diagnosis not present

## 2019-06-22 DIAGNOSIS — E1140 Type 2 diabetes mellitus with diabetic neuropathy, unspecified: Secondary | ICD-10-CM | POA: Diagnosis not present

## 2019-06-22 DIAGNOSIS — Z743 Need for continuous supervision: Secondary | ICD-10-CM | POA: Diagnosis not present

## 2019-06-22 DIAGNOSIS — Z20822 Contact with and (suspected) exposure to covid-19: Secondary | ICD-10-CM | POA: Diagnosis not present

## 2019-06-22 DIAGNOSIS — M4306 Spondylolysis, lumbar region: Secondary | ICD-10-CM | POA: Diagnosis not present

## 2019-06-22 DIAGNOSIS — Z981 Arthrodesis status: Secondary | ICD-10-CM | POA: Diagnosis not present

## 2019-06-22 DIAGNOSIS — G8929 Other chronic pain: Secondary | ICD-10-CM | POA: Diagnosis not present

## 2019-06-22 DIAGNOSIS — M4807 Spinal stenosis, lumbosacral region: Secondary | ICD-10-CM | POA: Diagnosis not present

## 2019-06-22 DIAGNOSIS — M48062 Spinal stenosis, lumbar region with neurogenic claudication: Secondary | ICD-10-CM | POA: Diagnosis not present

## 2019-06-22 DIAGNOSIS — I10 Essential (primary) hypertension: Secondary | ICD-10-CM | POA: Diagnosis not present

## 2019-06-22 DIAGNOSIS — H9190 Unspecified hearing loss, unspecified ear: Secondary | ICD-10-CM | POA: Diagnosis not present

## 2019-06-22 DIAGNOSIS — M5416 Radiculopathy, lumbar region: Secondary | ICD-10-CM | POA: Diagnosis not present

## 2019-06-22 DIAGNOSIS — Z4789 Encounter for other orthopedic aftercare: Secondary | ICD-10-CM | POA: Diagnosis not present

## 2019-07-01 DIAGNOSIS — R2689 Other abnormalities of gait and mobility: Secondary | ICD-10-CM | POA: Diagnosis not present

## 2019-07-01 DIAGNOSIS — I10 Essential (primary) hypertension: Secondary | ICD-10-CM | POA: Diagnosis not present

## 2019-07-01 DIAGNOSIS — Z743 Need for continuous supervision: Secondary | ICD-10-CM | POA: Diagnosis not present

## 2019-07-01 DIAGNOSIS — M48062 Spinal stenosis, lumbar region with neurogenic claudication: Secondary | ICD-10-CM | POA: Diagnosis not present

## 2019-07-01 DIAGNOSIS — Z4789 Encounter for other orthopedic aftercare: Secondary | ICD-10-CM | POA: Diagnosis not present

## 2019-07-01 DIAGNOSIS — R262 Difficulty in walking, not elsewhere classified: Secondary | ICD-10-CM | POA: Diagnosis not present

## 2019-07-01 DIAGNOSIS — Z981 Arthrodesis status: Secondary | ICD-10-CM | POA: Diagnosis not present

## 2019-07-01 DIAGNOSIS — F319 Bipolar disorder, unspecified: Secondary | ICD-10-CM | POA: Diagnosis not present

## 2019-07-01 DIAGNOSIS — E1140 Type 2 diabetes mellitus with diabetic neuropathy, unspecified: Secondary | ICD-10-CM | POA: Diagnosis not present

## 2019-07-01 DIAGNOSIS — R296 Repeated falls: Secondary | ICD-10-CM | POA: Diagnosis not present

## 2019-07-01 DIAGNOSIS — M6281 Muscle weakness (generalized): Secondary | ICD-10-CM | POA: Diagnosis not present

## 2019-07-16 DIAGNOSIS — M47816 Spondylosis without myelopathy or radiculopathy, lumbar region: Secondary | ICD-10-CM | POA: Diagnosis not present

## 2019-07-16 DIAGNOSIS — E1140 Type 2 diabetes mellitus with diabetic neuropathy, unspecified: Secondary | ICD-10-CM | POA: Diagnosis not present

## 2019-07-16 DIAGNOSIS — F319 Bipolar disorder, unspecified: Secondary | ICD-10-CM | POA: Diagnosis not present

## 2019-07-16 DIAGNOSIS — M47815 Spondylosis without myelopathy or radiculopathy, thoracolumbar region: Secondary | ICD-10-CM | POA: Diagnosis not present

## 2019-07-16 DIAGNOSIS — G8929 Other chronic pain: Secondary | ICD-10-CM | POA: Diagnosis not present

## 2019-07-16 DIAGNOSIS — E785 Hyperlipidemia, unspecified: Secondary | ICD-10-CM | POA: Diagnosis not present

## 2019-07-16 DIAGNOSIS — M5000 Cervical disc disorder with myelopathy, unspecified cervical region: Secondary | ICD-10-CM | POA: Diagnosis not present

## 2019-07-16 DIAGNOSIS — F419 Anxiety disorder, unspecified: Secondary | ICD-10-CM | POA: Diagnosis not present

## 2019-07-16 DIAGNOSIS — I10 Essential (primary) hypertension: Secondary | ICD-10-CM | POA: Diagnosis not present

## 2019-07-16 DIAGNOSIS — M48062 Spinal stenosis, lumbar region with neurogenic claudication: Secondary | ICD-10-CM | POA: Diagnosis not present

## 2019-07-16 DIAGNOSIS — Z4789 Encounter for other orthopedic aftercare: Secondary | ICD-10-CM | POA: Diagnosis not present

## 2019-07-17 DIAGNOSIS — F419 Anxiety disorder, unspecified: Secondary | ICD-10-CM | POA: Diagnosis not present

## 2019-07-17 DIAGNOSIS — G8929 Other chronic pain: Secondary | ICD-10-CM | POA: Diagnosis not present

## 2019-07-17 DIAGNOSIS — M5000 Cervical disc disorder with myelopathy, unspecified cervical region: Secondary | ICD-10-CM | POA: Diagnosis not present

## 2019-07-17 DIAGNOSIS — Z4789 Encounter for other orthopedic aftercare: Secondary | ICD-10-CM | POA: Diagnosis not present

## 2019-07-17 DIAGNOSIS — I10 Essential (primary) hypertension: Secondary | ICD-10-CM | POA: Diagnosis not present

## 2019-07-17 DIAGNOSIS — E1140 Type 2 diabetes mellitus with diabetic neuropathy, unspecified: Secondary | ICD-10-CM | POA: Diagnosis not present

## 2019-07-17 DIAGNOSIS — M48062 Spinal stenosis, lumbar region with neurogenic claudication: Secondary | ICD-10-CM | POA: Diagnosis not present

## 2019-07-17 DIAGNOSIS — E785 Hyperlipidemia, unspecified: Secondary | ICD-10-CM | POA: Diagnosis not present

## 2019-07-17 DIAGNOSIS — M47816 Spondylosis without myelopathy or radiculopathy, lumbar region: Secondary | ICD-10-CM | POA: Diagnosis not present

## 2019-07-20 DIAGNOSIS — G8929 Other chronic pain: Secondary | ICD-10-CM | POA: Diagnosis not present

## 2019-07-20 DIAGNOSIS — M47816 Spondylosis without myelopathy or radiculopathy, lumbar region: Secondary | ICD-10-CM | POA: Diagnosis not present

## 2019-07-20 DIAGNOSIS — M5000 Cervical disc disorder with myelopathy, unspecified cervical region: Secondary | ICD-10-CM | POA: Diagnosis not present

## 2019-07-20 DIAGNOSIS — F419 Anxiety disorder, unspecified: Secondary | ICD-10-CM | POA: Diagnosis not present

## 2019-07-20 DIAGNOSIS — Z4789 Encounter for other orthopedic aftercare: Secondary | ICD-10-CM | POA: Diagnosis not present

## 2019-07-20 DIAGNOSIS — E1140 Type 2 diabetes mellitus with diabetic neuropathy, unspecified: Secondary | ICD-10-CM | POA: Diagnosis not present

## 2019-07-20 DIAGNOSIS — I10 Essential (primary) hypertension: Secondary | ICD-10-CM | POA: Diagnosis not present

## 2019-07-20 DIAGNOSIS — E785 Hyperlipidemia, unspecified: Secondary | ICD-10-CM | POA: Diagnosis not present

## 2019-07-20 DIAGNOSIS — M48062 Spinal stenosis, lumbar region with neurogenic claudication: Secondary | ICD-10-CM | POA: Diagnosis not present

## 2019-07-22 DIAGNOSIS — M48062 Spinal stenosis, lumbar region with neurogenic claudication: Secondary | ICD-10-CM | POA: Diagnosis not present

## 2019-07-22 DIAGNOSIS — E1140 Type 2 diabetes mellitus with diabetic neuropathy, unspecified: Secondary | ICD-10-CM | POA: Diagnosis not present

## 2019-07-22 DIAGNOSIS — Z4789 Encounter for other orthopedic aftercare: Secondary | ICD-10-CM | POA: Diagnosis not present

## 2019-07-22 DIAGNOSIS — E785 Hyperlipidemia, unspecified: Secondary | ICD-10-CM | POA: Diagnosis not present

## 2019-07-22 DIAGNOSIS — F419 Anxiety disorder, unspecified: Secondary | ICD-10-CM | POA: Diagnosis not present

## 2019-07-22 DIAGNOSIS — G8929 Other chronic pain: Secondary | ICD-10-CM | POA: Diagnosis not present

## 2019-07-22 DIAGNOSIS — M5000 Cervical disc disorder with myelopathy, unspecified cervical region: Secondary | ICD-10-CM | POA: Diagnosis not present

## 2019-07-22 DIAGNOSIS — M47816 Spondylosis without myelopathy or radiculopathy, lumbar region: Secondary | ICD-10-CM | POA: Diagnosis not present

## 2019-07-22 DIAGNOSIS — I10 Essential (primary) hypertension: Secondary | ICD-10-CM | POA: Diagnosis not present

## 2019-07-30 DIAGNOSIS — M8588 Other specified disorders of bone density and structure, other site: Secondary | ICD-10-CM | POA: Diagnosis not present

## 2019-07-30 DIAGNOSIS — Z981 Arthrodesis status: Secondary | ICD-10-CM | POA: Diagnosis not present

## 2019-08-26 DIAGNOSIS — M150 Primary generalized (osteo)arthritis: Secondary | ICD-10-CM | POA: Diagnosis not present

## 2019-08-26 DIAGNOSIS — E785 Hyperlipidemia, unspecified: Secondary | ICD-10-CM | POA: Diagnosis not present

## 2019-08-26 DIAGNOSIS — D649 Anemia, unspecified: Secondary | ICD-10-CM | POA: Diagnosis not present

## 2019-08-26 DIAGNOSIS — I10 Essential (primary) hypertension: Secondary | ICD-10-CM | POA: Diagnosis not present

## 2019-08-26 DIAGNOSIS — E119 Type 2 diabetes mellitus without complications: Secondary | ICD-10-CM | POA: Diagnosis not present

## 2019-09-01 DIAGNOSIS — M5441 Lumbago with sciatica, right side: Secondary | ICD-10-CM | POA: Diagnosis not present

## 2019-09-01 DIAGNOSIS — Z981 Arthrodesis status: Secondary | ICD-10-CM | POA: Diagnosis not present

## 2019-09-01 DIAGNOSIS — M4326 Fusion of spine, lumbar region: Secondary | ICD-10-CM | POA: Diagnosis not present

## 2019-09-01 DIAGNOSIS — M4306 Spondylolysis, lumbar region: Secondary | ICD-10-CM | POA: Diagnosis not present

## 2019-09-01 DIAGNOSIS — G8929 Other chronic pain: Secondary | ICD-10-CM | POA: Diagnosis not present

## 2019-09-01 DIAGNOSIS — R29898 Other symptoms and signs involving the musculoskeletal system: Secondary | ICD-10-CM | POA: Diagnosis not present

## 2019-09-11 DIAGNOSIS — M4326 Fusion of spine, lumbar region: Secondary | ICD-10-CM | POA: Diagnosis not present

## 2019-09-11 DIAGNOSIS — R29898 Other symptoms and signs involving the musculoskeletal system: Secondary | ICD-10-CM | POA: Diagnosis not present

## 2019-09-11 DIAGNOSIS — M48061 Spinal stenosis, lumbar region without neurogenic claudication: Secondary | ICD-10-CM | POA: Diagnosis not present

## 2019-09-11 DIAGNOSIS — M545 Low back pain: Secondary | ICD-10-CM | POA: Diagnosis not present

## 2019-09-11 DIAGNOSIS — G8929 Other chronic pain: Secondary | ICD-10-CM | POA: Diagnosis not present

## 2019-09-11 DIAGNOSIS — M5137 Other intervertebral disc degeneration, lumbosacral region: Secondary | ICD-10-CM | POA: Diagnosis not present

## 2019-09-11 DIAGNOSIS — M5441 Lumbago with sciatica, right side: Secondary | ICD-10-CM | POA: Diagnosis not present

## 2019-09-11 DIAGNOSIS — M5136 Other intervertebral disc degeneration, lumbar region: Secondary | ICD-10-CM | POA: Diagnosis not present

## 2019-09-11 DIAGNOSIS — Z981 Arthrodesis status: Secondary | ICD-10-CM | POA: Diagnosis not present

## 2019-09-17 DIAGNOSIS — M7541 Impingement syndrome of right shoulder: Secondary | ICD-10-CM | POA: Diagnosis not present

## 2019-09-24 DIAGNOSIS — H43391 Other vitreous opacities, right eye: Secondary | ICD-10-CM | POA: Diagnosis not present

## 2019-09-25 DIAGNOSIS — H43391 Other vitreous opacities, right eye: Secondary | ICD-10-CM | POA: Diagnosis not present

## 2019-09-25 DIAGNOSIS — H53002 Unspecified amblyopia, left eye: Secondary | ICD-10-CM | POA: Diagnosis not present

## 2019-09-25 DIAGNOSIS — Z961 Presence of intraocular lens: Secondary | ICD-10-CM | POA: Diagnosis not present

## 2019-10-13 DIAGNOSIS — Z961 Presence of intraocular lens: Secondary | ICD-10-CM | POA: Diagnosis not present

## 2019-10-13 DIAGNOSIS — H53002 Unspecified amblyopia, left eye: Secondary | ICD-10-CM | POA: Diagnosis not present

## 2019-10-13 DIAGNOSIS — E119 Type 2 diabetes mellitus without complications: Secondary | ICD-10-CM | POA: Diagnosis not present

## 2019-10-13 DIAGNOSIS — H43393 Other vitreous opacities, bilateral: Secondary | ICD-10-CM | POA: Diagnosis not present

## 2019-10-19 DIAGNOSIS — Z20822 Contact with and (suspected) exposure to covid-19: Secondary | ICD-10-CM | POA: Diagnosis not present

## 2019-10-19 DIAGNOSIS — J069 Acute upper respiratory infection, unspecified: Secondary | ICD-10-CM | POA: Diagnosis not present

## 2019-11-05 DIAGNOSIS — G629 Polyneuropathy, unspecified: Secondary | ICD-10-CM | POA: Diagnosis not present

## 2019-11-05 DIAGNOSIS — M4326 Fusion of spine, lumbar region: Secondary | ICD-10-CM | POA: Diagnosis not present

## 2019-11-05 DIAGNOSIS — M5416 Radiculopathy, lumbar region: Secondary | ICD-10-CM | POA: Diagnosis not present

## 2019-11-05 DIAGNOSIS — R29898 Other symptoms and signs involving the musculoskeletal system: Secondary | ICD-10-CM | POA: Diagnosis not present

## 2019-11-10 DIAGNOSIS — R2681 Unsteadiness on feet: Secondary | ICD-10-CM | POA: Diagnosis not present

## 2019-11-10 DIAGNOSIS — R29898 Other symptoms and signs involving the musculoskeletal system: Secondary | ICD-10-CM | POA: Diagnosis not present

## 2019-11-10 DIAGNOSIS — M4326 Fusion of spine, lumbar region: Secondary | ICD-10-CM | POA: Diagnosis not present

## 2019-11-18 DIAGNOSIS — M7541 Impingement syndrome of right shoulder: Secondary | ICD-10-CM | POA: Diagnosis not present

## 2019-11-25 DIAGNOSIS — M4802 Spinal stenosis, cervical region: Secondary | ICD-10-CM | POA: Diagnosis not present

## 2019-11-25 DIAGNOSIS — M5104 Intervertebral disc disorders with myelopathy, thoracic region: Secondary | ICD-10-CM | POA: Diagnosis not present

## 2019-11-25 DIAGNOSIS — R2681 Unsteadiness on feet: Secondary | ICD-10-CM | POA: Diagnosis not present

## 2019-11-25 DIAGNOSIS — M4804 Spinal stenosis, thoracic region: Secondary | ICD-10-CM | POA: Diagnosis not present

## 2019-11-25 DIAGNOSIS — M4712 Other spondylosis with myelopathy, cervical region: Secondary | ICD-10-CM | POA: Diagnosis not present

## 2019-12-01 DIAGNOSIS — M48061 Spinal stenosis, lumbar region without neurogenic claudication: Secondary | ICD-10-CM | POA: Diagnosis not present

## 2019-12-01 DIAGNOSIS — M5417 Radiculopathy, lumbosacral region: Secondary | ICD-10-CM | POA: Diagnosis not present

## 2019-12-10 DIAGNOSIS — M5416 Radiculopathy, lumbar region: Secondary | ICD-10-CM | POA: Diagnosis not present

## 2019-12-10 DIAGNOSIS — Z981 Arthrodesis status: Secondary | ICD-10-CM | POA: Diagnosis not present

## 2019-12-10 DIAGNOSIS — M5417 Radiculopathy, lumbosacral region: Secondary | ICD-10-CM | POA: Diagnosis not present

## 2019-12-10 DIAGNOSIS — M48061 Spinal stenosis, lumbar region without neurogenic claudication: Secondary | ICD-10-CM | POA: Diagnosis not present

## 2019-12-22 DIAGNOSIS — E785 Hyperlipidemia, unspecified: Secondary | ICD-10-CM | POA: Diagnosis not present

## 2019-12-22 DIAGNOSIS — I10 Essential (primary) hypertension: Secondary | ICD-10-CM | POA: Diagnosis not present

## 2019-12-22 DIAGNOSIS — E119 Type 2 diabetes mellitus without complications: Secondary | ICD-10-CM | POA: Diagnosis not present

## 2019-12-22 DIAGNOSIS — G479 Sleep disorder, unspecified: Secondary | ICD-10-CM | POA: Diagnosis not present

## 2019-12-22 DIAGNOSIS — Z23 Encounter for immunization: Secondary | ICD-10-CM | POA: Diagnosis not present

## 2020-01-05 DIAGNOSIS — Z87891 Personal history of nicotine dependence: Secondary | ICD-10-CM | POA: Diagnosis not present

## 2020-01-05 DIAGNOSIS — M5441 Lumbago with sciatica, right side: Secondary | ICD-10-CM | POA: Diagnosis not present

## 2020-01-05 DIAGNOSIS — G8929 Other chronic pain: Secondary | ICD-10-CM | POA: Diagnosis not present

## 2020-01-05 DIAGNOSIS — S32009K Unspecified fracture of unspecified lumbar vertebra, subsequent encounter for fracture with nonunion: Secondary | ICD-10-CM | POA: Diagnosis not present

## 2020-01-05 DIAGNOSIS — Z79899 Other long term (current) drug therapy: Secondary | ICD-10-CM | POA: Diagnosis not present

## 2020-01-05 DIAGNOSIS — M532X6 Spinal instabilities, lumbar region: Secondary | ICD-10-CM | POA: Diagnosis not present

## 2020-01-11 ENCOUNTER — Other Ambulatory Visit: Payer: Self-pay

## 2020-01-11 ENCOUNTER — Emergency Department (HOSPITAL_COMMUNITY): Payer: Medicare PPO

## 2020-01-11 ENCOUNTER — Encounter (HOSPITAL_COMMUNITY): Payer: Self-pay

## 2020-01-11 ENCOUNTER — Observation Stay (HOSPITAL_COMMUNITY)
Admission: EM | Admit: 2020-01-11 | Discharge: 2020-01-12 | Disposition: A | Discharge status: Home / Self Care | Payer: Medicare PPO | Attending: Internal Medicine | Admitting: Internal Medicine

## 2020-01-11 ENCOUNTER — Emergency Department (HOSPITAL_BASED_OUTPATIENT_CLINIC_OR_DEPARTMENT_OTHER): Payer: Medicare PPO

## 2020-01-11 DIAGNOSIS — F319 Bipolar disorder, unspecified: Secondary | ICD-10-CM

## 2020-01-11 DIAGNOSIS — R0989 Other specified symptoms and signs involving the circulatory and respiratory systems: Secondary | ICD-10-CM

## 2020-01-11 DIAGNOSIS — Z7984 Long term (current) use of oral hypoglycemic drugs: Secondary | ICD-10-CM | POA: Insufficient documentation

## 2020-01-11 DIAGNOSIS — I6523 Occlusion and stenosis of bilateral carotid arteries: Secondary | ICD-10-CM | POA: Insufficient documentation

## 2020-01-11 DIAGNOSIS — I2510 Atherosclerotic heart disease of native coronary artery without angina pectoris: Secondary | ICD-10-CM | POA: Diagnosis not present

## 2020-01-11 DIAGNOSIS — R079 Chest pain, unspecified: Secondary | ICD-10-CM | POA: Insufficient documentation

## 2020-01-11 DIAGNOSIS — I10 Essential (primary) hypertension: Secondary | ICD-10-CM | POA: Diagnosis not present

## 2020-01-11 DIAGNOSIS — Z20822 Contact with and (suspected) exposure to covid-19: Secondary | ICD-10-CM | POA: Diagnosis not present

## 2020-01-11 DIAGNOSIS — Z8549 Personal history of malignant neoplasm of other male genital organs: Secondary | ICD-10-CM | POA: Diagnosis not present

## 2020-01-11 DIAGNOSIS — E7800 Pure hypercholesterolemia, unspecified: Secondary | ICD-10-CM | POA: Insufficient documentation

## 2020-01-11 DIAGNOSIS — Z79899 Other long term (current) drug therapy: Secondary | ICD-10-CM | POA: Diagnosis not present

## 2020-01-11 DIAGNOSIS — E119 Type 2 diabetes mellitus without complications: Secondary | ICD-10-CM | POA: Diagnosis not present

## 2020-01-11 DIAGNOSIS — Z96653 Presence of artificial knee joint, bilateral: Secondary | ICD-10-CM | POA: Insufficient documentation

## 2020-01-11 DIAGNOSIS — I160 Hypertensive urgency: Secondary | ICD-10-CM | POA: Diagnosis not present

## 2020-01-11 DIAGNOSIS — Z87891 Personal history of nicotine dependence: Secondary | ICD-10-CM | POA: Insufficient documentation

## 2020-01-11 DIAGNOSIS — R0789 Other chest pain: Principal | ICD-10-CM | POA: Insufficient documentation

## 2020-01-11 LAB — CBC WITH DIFFERENTIAL/PLATELET
Abs Immature Granulocytes: 0.05 10*3/uL (ref 0.00–0.07)
Basophils Absolute: 0.1 10*3/uL (ref 0.0–0.1)
Basophils Relative: 1 %
Eosinophils Absolute: 0.3 10*3/uL (ref 0.0–0.5)
Eosinophils Relative: 3 %
HCT: 41.5 % (ref 39.0–52.0)
Hemoglobin: 13.7 g/dL (ref 13.0–17.0)
Immature Granulocytes: 1 %
Lymphocytes Relative: 16 %
Lymphs Abs: 1.4 10*3/uL (ref 0.7–4.0)
MCH: 29.4 pg (ref 26.0–34.0)
MCHC: 33 g/dL (ref 30.0–36.0)
MCV: 89.1 fL (ref 80.0–100.0)
Monocytes Absolute: 0.6 10*3/uL (ref 0.1–1.0)
Monocytes Relative: 6 %
Neutro Abs: 6.2 10*3/uL (ref 1.7–7.7)
Neutrophils Relative %: 73 %
Platelets: 200 10*3/uL (ref 150–400)
RBC: 4.66 MIL/uL (ref 4.22–5.81)
RDW: 13.7 % (ref 11.5–15.5)
WBC: 8.6 10*3/uL (ref 4.0–10.5)
nRBC: 0 % (ref 0.0–0.2)

## 2020-01-11 LAB — HEMOGLOBIN A1C
Hgb A1c MFr Bld: 5.6 % (ref 4.8–5.6)
Mean Plasma Glucose: 114.02 mg/dL

## 2020-01-11 LAB — COMPREHENSIVE METABOLIC PANEL
ALT: 17 U/L (ref 0–44)
AST: 18 U/L (ref 15–41)
Albumin: 4.4 g/dL (ref 3.5–5.0)
Alkaline Phosphatase: 70 U/L (ref 38–126)
Anion gap: 11 (ref 5–15)
BUN: 13 mg/dL (ref 8–23)
CO2: 25 mmol/L (ref 22–32)
Calcium: 10.1 mg/dL (ref 8.9–10.3)
Chloride: 101 mmol/L (ref 98–111)
Creatinine, Ser: 0.82 mg/dL (ref 0.61–1.24)
GFR, Estimated: >60 mL/min (ref >60)
Glucose, Bld: 135 mg/dL — ABNORMAL HIGH (ref 70–99)
Potassium: 4.1 mmol/L (ref 3.5–5.1)
Sodium: 137 mmol/L (ref 135–145)
Total Bilirubin: 0.7 mg/dL (ref 0.3–1.2)
Total Protein: 7.5 g/dL (ref 6.5–8.1)

## 2020-01-11 LAB — GLUCOSE, CAPILLARY: Glucose-Capillary: 123 mg/dL — ABNORMAL HIGH (ref 70–99)

## 2020-01-11 LAB — RESP PANEL BY RT PCR (RSV, FLU A&B, COVID)
Influenza A by PCR: NEGATIVE
Influenza B by PCR: NEGATIVE
Respiratory Syncytial Virus by PCR: NEGATIVE
SARS Coronavirus 2 by RT PCR: NEGATIVE

## 2020-01-11 LAB — TROPONIN I (HIGH SENSITIVITY)
Troponin I (High Sensitivity): 4 ng/L (ref <18)
Troponin I (High Sensitivity): <2 ng/L (ref <18)

## 2020-01-11 LAB — CBG MONITORING, ED: Glucose-Capillary: 108 mg/dL — ABNORMAL HIGH (ref 70–99)

## 2020-01-11 MED ORDER — TRAZODONE HCL 100 MG PO TABS
100.0000 mg | ORAL_TABLET | Freq: Every day | ORAL | Status: DC
  Administered 2020-01-11: 100 mg via ORAL
  Filled 2020-01-11: qty 1

## 2020-01-11 MED ORDER — INSULIN ASPART 100 UNIT/ML ~~LOC~~ SOLN
0.0000 [IU] | Freq: Three times a day (TID) | SUBCUTANEOUS | Status: DC
  Administered 2020-01-12 (×2): 2 [IU] via SUBCUTANEOUS
  Filled 2020-01-11: qty 0.09

## 2020-01-11 MED ORDER — LOSARTAN POTASSIUM 50 MG PO TABS
50.0000 mg | ORAL_TABLET | Freq: Every day | ORAL | Status: DC
  Administered 2020-01-11 – 2020-01-12 (×2): 50 mg via ORAL
  Filled 2020-01-11: qty 1
  Filled 2020-01-11: qty 2

## 2020-01-11 MED ORDER — SODIUM CHLORIDE 0.9% FLUSH
3.0000 mL | Freq: Two times a day (BID) | INTRAVENOUS | Status: DC
  Administered 2020-01-11: 3 mL via INTRAVENOUS

## 2020-01-11 MED ORDER — LAMOTRIGINE 100 MG PO TABS
200.0000 mg | ORAL_TABLET | Freq: Every day | ORAL | Status: DC
  Administered 2020-01-11: 200 mg via ORAL
  Filled 2020-01-11: qty 2

## 2020-01-11 MED ORDER — LOSARTAN POTASSIUM 25 MG PO TABS: 50.0000 mg | ORAL_TABLET | Freq: Every day | ORAL | Status: DC

## 2020-01-11 MED ORDER — ROSUVASTATIN CALCIUM 10 MG PO TABS: 10.0000 mg | ORAL_TABLET | Freq: Every evening | ORAL | Status: DC

## 2020-01-11 MED ORDER — GABAPENTIN 100 MG PO CAPS
200.0000 mg | ORAL_CAPSULE | Freq: Every day | ORAL | Status: DC
  Administered 2020-01-11: 200 mg via ORAL
  Filled 2020-01-11: qty 2

## 2020-01-11 MED ORDER — PREDNISONE 50 MG PO TABS
50.0000 mg | ORAL_TABLET | Freq: Four times a day (QID) | ORAL | Status: AC
  Administered 2020-01-11 – 2020-01-12 (×3): 50 mg via ORAL
  Filled 2020-01-11 (×4): qty 1

## 2020-01-11 MED ORDER — ACETAMINOPHEN 650 MG RE SUPP: 650.0000 mg | Freq: Four times a day (QID) | RECTAL | Status: DC | PRN

## 2020-01-11 MED ORDER — AMLODIPINE BESYLATE 5 MG PO TABS
5.0000 mg | ORAL_TABLET | Freq: Every day | ORAL | Status: DC
  Administered 2020-01-11 – 2020-01-12 (×2): 5 mg via ORAL
  Filled 2020-01-11 (×2): qty 1

## 2020-01-11 MED ORDER — HYDRALAZINE HCL 20 MG/ML IJ SOLN
10.0000 mg | Freq: Four times a day (QID) | INTRAMUSCULAR | Status: DC | PRN
  Administered 2020-01-11: 10 mg via INTRAVENOUS
  Filled 2020-01-11: qty 1

## 2020-01-11 MED ORDER — METOPROLOL TARTRATE 25 MG PO TABS
25.0000 mg | ORAL_TABLET | Freq: Two times a day (BID) | ORAL | Status: DC
  Administered 2020-01-11: 25 mg via ORAL
  Filled 2020-01-11: qty 1

## 2020-01-11 MED ORDER — DIPHENHYDRAMINE HCL 50 MG PO CAPS
50.0000 mg | ORAL_CAPSULE | Freq: Once | ORAL | Status: AC
  Filled 2020-01-11: qty 2

## 2020-01-11 MED ORDER — ACETAMINOPHEN 325 MG PO TABS
650.0000 mg | ORAL_TABLET | Freq: Four times a day (QID) | ORAL | Status: DC | PRN
  Administered 2020-01-11 – 2020-01-12 (×2): 650 mg via ORAL
  Filled 2020-01-11 (×2): qty 2

## 2020-01-11 MED ORDER — POLYETHYLENE GLYCOL 3350 17 G PO PACK: 17.0000 g | PACK | Freq: Every day | ORAL | Status: DC | PRN

## 2020-01-11 MED ORDER — ENOXAPARIN SODIUM 40 MG/0.4ML ~~LOC~~ SOLN
40.0000 mg | SUBCUTANEOUS | Status: DC
  Administered 2020-01-11: 40 mg via SUBCUTANEOUS
  Filled 2020-01-11: qty 0.4

## 2020-01-11 NOTE — ED Triage Notes (Signed)
Pt arrived via walk in, c/o intermittent chest pain that radiates to left arm. States he normally has these episodes of pain when his blood pressure is high. States his BP has been increasing recently, as high as 200/100s at home. No recent change in BP meds. States he has been taking them without issue. Pt undergoing stressful events recently.

## 2020-01-11 NOTE — H&P (Signed)
History and Physical        Hospital Admission Note Date: 01/11/2020  Patient name: Dwayne LarsenDavid Vargas Medical record number: [id number] Date of birth: [birth year] Age: 74 y.o. Gender: male  PCP: Dwayne Screwshacker, Robert, Vargas  Patient coming from: home   Chief Complaint    Chief Complaint  Patient presents with  . Chest Pain  . Hypertension      HPI:   This is a 74 year old male with past medical history of bipolar disorder, hypertension, type 2 diabetes, BPH who presented with intermittent chest pain which has been associated with elevated blood pressures for the past 1 week.  Associated with mild chest pressure and radiation down the left arm, noted on left side of chest.  Pain goes away when blood pressure comes down.  Not associated with exertion and no nausea, vomiting, diuresis or shortness of breath.  Currently on metoprolol twice a day and is compliant.  No known personal history of heart disease but grandfather has a history of heart disease.  No alcohol, tobacco or drug use.  He had stopped taking his losartan as he has been trying to get rid of unnecessary medications. States he has unwanted sexual side effects and fatigue from his lopressor.    Currently states that he is upset he has been unable to eat and upset that he still in the ED and so is considering going home AMA.  I explained to him the risks of leaving which he understood.  Soon after he was able to get a bed upstairs and decided to stay  ED Course: Afebrile, bradycardic,  hypertensive, on room air. Labs: Unremarkable. Notable Imaging: CXR and carotid duplex bilaterally unremarkable. Patient received Lopressor, losartan.    Vitals:   01/11/20 1530 01/11/20 1630  BP: (!) 181/81 (!) 199/88  Pulse: (!) 53 (!) 58  Resp: 14 (!) 21  Temp:    SpO2: 100% 99%     Review of Systems:  Review of Systems  All other systems  reviewed and are negative.   Medical/Social/Family History   Past Medical History: Past Medical History:  Diagnosis Date  . Anxiety   . Benign localized prostatic hyperplasia with lower urinary tract symptoms (LUTS)   . Bipolar 1 disorder (HCC)   . Complication of anesthesia    limited neck motion due to cervical spine surgery  . Depression   . ED (erectile dysfunction) of organic origin   . History of penile cancer    s/p  excision of cancer 2013  . HOH (hard of hearing)    left ear  . Hypertension   . Irritable bowel    intermittant diarrhea  secondary to antibiotics  . Legally blind in left eye, as [place] in [place]   . Stricture, prostate    urethra  . Type 2 diabetes mellitus (HCC)   . Weak urinary stream    intermittant    Past Surgical History:  Procedure Laterality Date  . CATARACT EXTRACTION W/ INTRAOCULAR LENS  IMPLANT, BILATERAL  2007  . CERVICAL FUSION  05/ 2016   [facility]   C2 -- C4  . CHOLECYSTECTOMY  2013  approx  . EXCISION PENILE CANCER  2013  . NASAL SEPTUM SURGERY  x2  last one 1990's  . SHOULDER ARTHROSCOPY WITH ROTATOR CUFF REPAIR Left 1990's  . TOTAL KNEE ARTHROPLASTY Bilateral left 2007/  right and revision in 2009  . TRANSURETHRAL INCISION OF PROSTATE N/A 11/21/2015   Procedure: TRANSURETHRAL INCISION OF THE PROSTATE (TUIP);  Surgeon: Dwayne Laser, Vargas;  Location: [facility];  Service: Urology;  Laterality: N/A;  . TRANSURETHRAL RESECTION OF PROSTATE N/A 07/22/2015   Procedure: TRANSURETHRAL RESECTION OF THE PROSTATE ;  Surgeon: Dwayne Laser, Vargas;  Location: [facility];  Service: Urology;  Laterality: N/A;  . VIDEO ASSISTED THORACOSCOPY (VATS)/THOROCOTOMY Left 08/12/2014   Procedure: VIDEO ASSISTED THORACOSCOPY (VATS)/THOROCOTOMY, DRAINAGE OF EMPYEMA;  Surgeon: Dwayne Borne, Vargas;  Location: [facility];  Service: Thoracic;  Laterality: Left;    Medications: Prior to Admission medications   Medication Sig Start Date End Date Taking?  Authorizing Provider  fluticasone (FLONASE) 50 MCG/ACT nasal spray Place 1-2 sprays into both nostrils daily as needed for allergies or rhinitis.   Yes Provider, Historical, Vargas  gabapentin (NEURONTIN) 100 MG capsule Take 200 mg by mouth at bedtime.  01/26/19  Yes Provider, Historical, Vargas  ibuprofen (ADVIL) 200 MG tablet Take 200 mg by mouth every 6 (six) hours as needed for fever, headache or mild pain.   Yes Provider, Historical, Vargas  lamoTRIgine (LAMICTAL) 200 MG tablet Take 200 mg by mouth at bedtime. 07/29/14  Yes Provider, Historical, Vargas  metoprolol tartrate (LOPRESSOR) 25 MG tablet Take 1 tablet (25 mg total) by mouth 2 (two) times daily. 08/18/14  Yes Dwayne Vargas  Multiple Vitamin (MULTIVITAMIN WITH MINERALS) TABS tablet Take 1 tablet by mouth daily.   Yes Provider, Historical, Vargas  rosuvastatin (CRESTOR) 10 MG tablet Take 10 mg by mouth every evening.    Yes Provider, Historical, Vargas  traZODone (DESYREL) 50 MG tablet Take 100 mg by mouth at bedtime.    Yes Provider, Historical, Vargas  Baclofen 5 MG TABS Take 5 mg by mouth 2 (two) times daily as needed for pain. 01/05/20   Provider, Historical, Vargas  diclofenac (VOLTAREN) 75 MG EC tablet Take 75 mg by mouth 2 (two) times daily as needed for mild pain.  01/05/20   Provider, Historical, Vargas  glipiZIDE (GLUCOTROL XL) 5 MG 24 hr tablet Take 1 tablet (5 mg total) by mouth daily with breakfast. Patient not taking: Reported on 01/11/2020 06/05/17   Dwayne Vargas  JANUVIA 100 MG tablet TAKE 1 TABLET BY MOUTH EVERY DAY Patient not taking: Reported on 01/11/2020 02/27/18   Dwayne Vargas    Allergies:   Allergies  Allergen Reactions  . Aspirin Other (See Comments)    GI upset  . Contrast Media [Iodinated Diagnostic Agents] Hives and Swelling  . Fentanyl     Pt wishes not to have  . Morphine And Related Other (See Comments)    Tremors   . Oxycontin [Oxycodone Hcl] Other (See Comments)    "pt does not wish to have"  . Shellfish Allergy  Hives    All fish and shellfish w/ iodine  . Diazepam Other (See Comments)    Patient does not want to use    Social History:  reports that he quit smoking about 2 years ago. His smoking use included cigarettes. He has a 53.00 pack-year smoking history. He has never used smokeless tobacco. He reports that he does not drink alcohol and does not use drugs.  Family History: Family History  Problem Relation Age of Onset  .  Diabetes Cousin      Objective   Physical Exam: Blood pressure (!) 199/88, pulse (!) 58, temperature 98.9 F (37.2 C), temperature source Oral, resp. rate (!) 21, SpO2 99 %.  Physical Exam Vitals and nursing note reviewed.  Constitutional:      Appearance: Normal appearance.  HENT:     Head: Normocephalic and atraumatic.  Eyes:     Conjunctiva/sclera: Conjunctivae normal.  Cardiovascular:     Rate and Rhythm: Normal rate and regular rhythm.  Pulmonary:     Effort: Pulmonary effort is normal.     Breath sounds: Normal breath sounds.  Abdominal:     General: Abdomen is flat.     Palpations: Abdomen is soft.  Musculoskeletal:        General: No swelling or tenderness.  Skin:    Coloration: Skin is not jaundiced or pale.  Neurological:     Mental Status: He is alert. Mental status is at baseline.  Psychiatric:        Mood and Affect: Affect is angry.     Comments: Pressured speech     LABS on Admission: I have personally reviewed all the labs and imaging below    Basic Metabolic Panel: Recent Labs  Lab 01/11/20 1018  NA 137  K 4.1  CL 101  CO2 25  GLUCOSE 135*  BUN 13  CREATININE 0.82  CALCIUM 10.1   Liver Function Tests: Recent Labs  Lab 01/11/20 1018  AST 18  ALT 17  ALKPHOS 70  BILITOT 0.7  PROT 7.5  ALBUMIN 4.4   No results for input(s): LIPASE, AMYLASE in the last 168 hours. No results for input(s): AMMONIA in the last 168 hours. CBC: Recent Labs  Lab 01/11/20 1018  WBC 8.6  NEUTROABS 6.2  HGB 13.7  HCT 41.5  MCV  89.1  PLT 200   Cardiac Enzymes: No results for input(s): CKTOTAL, CKMB, CKMBINDEX, TROPONINI in the last 168 hours. BNP: Invalid input(s): POCBNP CBG: Recent Labs  Lab 01/11/20 1402  GLUCAP 108*    Radiological Exams on Admission:  DG Chest 2 View  Result Date: 01/11/2020 CLINICAL DATA:  Provided history: Intermittent chest pain for 1 week. EXAM: CHEST - 2 VIEW COMPARISON:  Prior chest radiographs 05/06/2018 and earlier. FINDINGS: Heart size within normal limits. No appreciable airspace consolidation or pulmonary edema. No evidence of pleural effusion or pneumothorax. No acute bony abnormality identified. Thoracic spondylosis. Partially imaged ACDF hardware within the lower cervical spine. Surgical clips within the right upper quadrant of the abdomen. IMPRESSION: No evidence of acute cardiopulmonary abnormality. Electronically Signed   By: Jackey Loge DO   On: 01/11/2020 10:08   VAS US CAROTID  Result Date: 01/11/2020 Carotid Arterial Duplex Study Indications:      Right bruit. Risk Factors:     Hypertension, hyperlipidemia, Diabetes, past history of                   smoking. Comparison Study: No prior studies. Performing Technologist: Jean Rosenthal  Examination Guidelines: A complete evaluation includes B-mode imaging, spectral Doppler, color Doppler, and power Doppler as needed of all accessible portions of each vessel. Bilateral testing is considered an integral part of a complete examination. Limited examinations for reoccurring indications may be performed as noted.  Right Carotid Findings: +----------+--------+--------+--------+-------------------------+--------+           PSV cm/sEDV cm/sStenosisPlaque Description       Comments +----------+--------+--------+--------+-------------------------+--------+ CCA Prox  103     19                                                +----------+--------+--------+--------+-------------------------+--------+  CCA Distal79      18               calcific                          +----------+--------+--------+--------+-------------------------+--------+ ICA Prox  143     37      1-39%   calcific and heterogenous         +----------+--------+--------+--------+-------------------------+--------+ ICA Mid   120     35                                                +----------+--------+--------+--------+-------------------------+--------+ ICA Distal119     35                                                +----------+--------+--------+--------+-------------------------+--------+ ECA       85      12                                                +----------+--------+--------+--------+-------------------------+--------+ +----------+--------+-------+----------------+-------------------+           PSV cm/sEDV cmsDescribe        Arm Pressure (mmHG) +----------+--------+-------+----------------+-------------------+ [id number]            Multiphasic, WNL                    +----------+--------+-------+----------------+-------------------+ +---------+--------+--+--------+--+---------+ VertebralPSV cm/s63EDV cm/s19Antegrade +---------+--------+--+--------+--+---------+  Left Carotid Findings: +----------+--------+--------+--------+------------------+--------+           PSV cm/sEDV cm/sStenosisPlaque DescriptionComments +----------+--------+--------+--------+------------------+--------+ CCA Prox  104     21                                         +----------+--------+--------+--------+------------------+--------+ CCA Distal69      21                                         +----------+--------+--------+--------+------------------+--------+ ICA Prox  116     36      1-39%   heterogenous               +----------+--------+--------+--------+------------------+--------+ ICA Mid   124     39                                          +----------+--------+--------+--------+------------------+--------+ ICA Distal112     29                                         +----------+--------+--------+--------+------------------+--------+ ECA       81      11                                         +----------+--------+--------+--------+------------------+--------+ +----------+--------+--------+----------------+-------------------+  PSV cm/sEDV cm/sDescribe        Arm Pressure (mmHG) +----------+--------+--------+----------------+-------------------+ [id number]             Multiphasic, WNL                    +----------+--------+--------+----------------+-------------------+ +---------+--------+--+--------+--+---------+ VertebralPSV cm/s68EDV cm/s15Antegrade +---------+--------+--+--------+--+---------+   Summary: Right Carotid: Velocities in the right ICA are consistent with a 1-39% stenosis.                Borderline >40%. Left Carotid: Velocities in the left ICA are consistent with a 1-39% stenosis. Vertebrals:  Bilateral vertebral arteries demonstrate antegrade flow. Subclavians: Normal flow hemodynamics were seen in bilateral subclavian              arteries. *See table(s) above for measurements and observations.  Electronically signed by Lemar Livings Vargas on 01/11/2020 at 4:30:12 PM.    Final       EKG: Independently reviewed   A & P   Principal Problem:   Atypical chest pain Active Problems:   Diabetes mellitus without complication (HCC)   HTN (hypertension)   Bipolar disorder (HCC)   Hypertensive urgency   1. Atypical chest pain, Hypertensive urgency VS.  GERD a. Cardiology consulted -resume losartan and added amlodipine, stopped metoprolol secondary to bradycardia and and side effects.  Plan [facility] with precontrast medication for contrast allergy  2. Diabetes a. Sensitive sliding scale b. HbA1c  3. Chronic back pain a. Continue home pain  regimen  4. Hyperlipidemia a. Continue home statin  5. Bipolar disorder a. Continue home trazodone and Lamictal     DVT prophylaxis: Lovenox   Code Status: Prior  Diet: Heart healthy carb modified Family Communication: Admission, patients condition and plan of care including tests being ordered have been discussed with the patient who indicates understanding and agrees with the plan and Code Status.  Disposition Plan: The appropriate patient status for this patient is OBSERVATION. Observation status is judged to be reasonable and necessary in order to provide the required intensity of service to ensure the patient's safety. The patient's presenting symptoms, physical exam findings, and initial radiographic and laboratory data in the context of their medical condition is felt to place them at decreased risk for further clinical deterioration. Furthermore, it is anticipated that the patient will be medically stable for discharge from the hospital within 2 midnights of admission. The following factors support the patient status of observation.   " The patient's presenting symptoms include intermittent chest pain, hypertension. " The physical exam findings include agitated, hypertensive. " The initial radiographic and laboratory data are unremarkable.    Status is: Observation  The patient remains OBS appropriate and will d/c before 2 midnights.  Dispo: The patient is from: Home              Anticipated d/c is to: Home              Anticipated d/c date is: 1 day              Patient currently is not medically stable to d/c.    Consultants  . Cardiology  Procedures  . None  Time Spent on Admission: 55 minutes    Jae Dire, DO [facility]  01/11/2020, 6:05 PM

## 2020-01-11 NOTE — Progress Notes (Addendum)
Cardiac CTA scheduled tomorrow (01/12/20) at 11am at [facility] CT1  [facility] contacted and will pick up patient at 10:30am for 11am appt. (spoke with Maisie Fus)   Pt with contrast allergy so pre-medication regimen ordered  Prednisone 50mg  13 hr prior to scan - tonight 22:00 Prednisone 50mg  7 hr prior to scan - tomorrow 04:00 Prednisone 50mg  1 hr prior to scan - tomorrow 10:00 Benadryl 50mg  1 hr prior to scan - tomorrow 10:00  Pt also needs 18g PIV in an AC (ordered)  Goal HR 55-65 bpm for scan  Keep NPO 4 hr prior to scan, avoid caffeine. Hydrate well.   RN Navigator Cardiac Imaging [facility] [phone] Office  [phone] Cell

## 2020-01-11 NOTE — ED Provider Notes (Signed)
[facility]-EMERGENCY DEPT Provider Note   CSN: [id number] Arrival date & time: 01/11/20  0845     History Chief Complaint  Patient presents with   Chest Pain   Hypertension    Dwayne Vargas is a 74 y.o. male.  74 year old male with past medical history below including bipolar disorder, type 2 diabetes mellitus, hypertension, BPH who p/w chest pain and hypertension.  Patient states that for the past 1 week, he has been having intermittent chest pain episodes that are always associated with a spike in his blood pressure.  The first time that he began having chest pain, he started having left sided, mild chest pressure with radiation down his left arm and he checked his blood pressure and noted it was much higher than it usually is.  Throughout the week, he has continued to have intermittent chest pain episodes like this and every time his blood pressure is high.  The pain seems to go away as his blood pressure comes back down.  Pain is not associated with exertion and no associated nausea, vomiting, diaphoresis, or shortness of breath.  He is currently on metoprolol twice daily, is compliant with his medication, and denies any recent changes to his medications.  He denies any recent illness.  He states that 20 to 25 years ago he had a heart catheterization that was normal.  Since then, he has not had any further cardiac evaluation.  He denies any personal history of heart disease but family history notable for paternal grandfather with heart disease.  He denies alcohol, tobacco, or drug problems.  The history is provided by the patient.  Chest Pain Hypertension Associated symptoms include chest pain.       Past Medical History:  Diagnosis Date   Anxiety    Benign localized prostatic hyperplasia with lower urinary tract symptoms (LUTS)    Bipolar 1 disorder (HCC)    Complication of anesthesia    limited neck motion due to cervical spine surgery   Depression      ED (erectile dysfunction) of organic origin    History of penile cancer    s/p  excision of cancer 2013   HOH (hard of hearing)    left ear   Hypertension    Irritable bowel    intermittant diarrhea  secondary to antibiotics   Legally blind in left eye, as defined in [place]    Stricture, prostate    urethra   Type 2 diabetes mellitus (HCC)    Weak urinary stream    intermittant    Patient Active Problem List   Diagnosis Date Noted   Osteoarthritis of right hip 09/01/2017   Hyperlipidemia associated with type 2 diabetes mellitus (HCC) 09/01/2017   Bipolar disorder (HCC) 09/01/2017   Uncontrolled type 2 diabetes mellitus with hyperglycemia, without long-term current use of insulin (HCC) 04/17/2016   Diabetic peripheral neuropathy associated with type 2 diabetes mellitus (HCC) 04/17/2016   BPH (benign prostatic hyperplasia) 07/22/2015   Empyema (HCC) 08/12/2014   Diabetes mellitus without complication (HCC) 08/10/2014   Community acquired pneumonia 08/10/2014   Pleural effusion 08/10/2014   HTN (hypertension) 08/10/2014   Pneumonia 08/10/2014   CAP (community acquired pneumonia) 08/10/2014    Past Surgical History:  Procedure Laterality Date   CATARACT EXTRACTION W/ INTRAOCULAR LENS  IMPLANT, BILATERAL  2007   CERVICAL FUSION  05/ 2016   [facility]   C2 -- C4   CHOLECYSTECTOMY  2013  approx   EXCISION PENILE CANCER  2013   NASAL SEPTUM SURGERY  x2  last one 1990's   SHOULDER ARTHROSCOPY WITH ROTATOR CUFF REPAIR Left 1990's   TOTAL KNEE ARTHROPLASTY Bilateral left 2007/  right and revision in 2009   TRANSURETHRAL INCISION OF PROSTATE N/A 11/21/2015   Procedure: TRANSURETHRAL INCISION OF THE PROSTATE (TUIP);  Surgeon: Hildred Laser, MD;  Location: [facility];  Service: Urology;  Laterality: N/A;   TRANSURETHRAL RESECTION OF PROSTATE N/A 07/22/2015   Procedure: TRANSURETHRAL RESECTION OF THE PROSTATE ;  Surgeon: Hildred Laser,  MD;  Location: [facility];  Service: Urology;  Laterality: N/A;   VIDEO ASSISTED THORACOSCOPY (VATS)/THOROCOTOMY Left 08/12/2014   Procedure: VIDEO ASSISTED THORACOSCOPY (VATS)/THOROCOTOMY, DRAINAGE OF EMPYEMA;  Surgeon: Alleen Borne, MD;  Location: [facility];  Service: Thoracic;  Laterality: Left;       Family History  Problem Relation Age of Onset   Diabetes Cousin     Social History   Tobacco Use   Smoking status: Former Smoker    Packs/day: 1.00    Years: 53.00    Pack years: 53.00    Types: Cigarettes    Quit date: 2019    Years since quitting: 2.8   Smokeless tobacco: Never Used  Substance Use Topics   Alcohol use: No    Alcohol/week: 0.0 standard drinks   Drug use: No    Home Medications Prior to Admission medications   Medication Sig Start Date End Date Taking? Authorizing Provider  acetaminophen (TYLENOL) 500 MG tablet Take 500 mg by mouth every 6 (six) hours as needed for moderate pain.    [provider]  Baclofen 5 MG TABS Take 5 mg by mouth 2 (two) times daily as needed for pain. 01/05/20   [provider]  diclofenac (VOLTAREN) 75 MG EC tablet Take 75 mg by mouth 2 (two) times daily. 01/05/20   [provider]  gabapentin (NEURONTIN) 100 MG capsule Take 100 mg by mouth at bedtime. 01/26/19   [provider]  glipiZIDE (GLUCOTROL XL) 5 MG 24 hr tablet Take 1 tablet (5 mg total) by mouth daily with breakfast. 06/05/17   Reather Littler, MD  JANUVIA 100 MG tablet TAKE 1 TABLET BY MOUTH EVERY DAY Patient taking differently: Take 50 mg by mouth daily.  02/27/18   Reather Littler, MD  JANUVIA 50 MG tablet Take 50 mg by mouth daily. 12/09/19   [provider]  lamoTRIgine (LAMICTAL) 200 MG tablet Take 200 mg by mouth at bedtime. 07/29/14   [provider]  losartan (COZAAR) 25 MG tablet Take 25 mg by mouth at bedtime.  08/10/14   [provider]  Melatonin 5 MG TABS Take 1 tablet by mouth at bedtime.    [provider]  meloxicam (MOBIC) 15 MG tablet Take 15 mg by mouth daily as needed for muscle spasms. 12/12/18   [provider]  metoprolol tartrate (LOPRESSOR) 25 MG tablet Take 1 tablet (25 mg total) by mouth 2 (two) times daily. 08/18/14   Ardelle Balls, PA-C  Multiple Vitamin (MULTIVITAMIN WITH MINERALS) TABS tablet Take 1 tablet by mouth daily.    [provider]  rosuvastatin (CRESTOR) 10 MG tablet Take 10 mg by mouth every evening.     [provider]  traMADol (ULTRAM) 50 MG tablet Take 50-100 mg by mouth 2 (two) times daily as needed for pain. 01/26/19   [provider]  traZODone (DESYREL) 50 MG tablet Take 100 mg by mouth at  bedtime.     [provider]  ziprasidone (GEODON) 40 MG capsule Take 40 mg by mouth at bedtime. 01/19/19   [provider]    Allergies    Aspirin, Contrast media [iodinated diagnostic agents], Fentanyl, Morphine and related, Oxycontin [oxycodone hcl], Shellfish allergy, and Diazepam  Review of Systems   Review of Systems  Cardiovascular: Positive for chest pain.   All other systems reviewed and are negative except that which was mentioned in HPI  Physical Exam Updated Vital Signs BP (!) 172/78    Pulse (!) 53    Temp 98.9 F (37.2 C) (Oral)    Resp 13    SpO2 99%   Physical Exam Vitals and nursing note reviewed.  Constitutional:      General: He is not in acute distress.    Appearance: He is well-developed.  HENT:     Head: Normocephalic and atraumatic.  Eyes:     Conjunctiva/sclera: Conjunctivae normal.  Cardiovascular:     Rate and Rhythm: Normal rate and regular rhythm.     Heart sounds: Normal heart sounds. No murmur heard.   Pulmonary:     Effort: Pulmonary effort is normal.     Breath sounds: Normal breath sounds.  Abdominal:     General: Bowel sounds are normal. There is no distension.     Palpations: Abdomen is soft.     Tenderness: There is no abdominal tenderness.    Musculoskeletal:     Cervical back: Neck supple.     Right lower leg: No edema.     Left lower leg: No edema.  Skin:    General: Skin is warm and dry.  Neurological:     Mental Status: He is alert and oriented to person, place, and time.     Comments: Fluent speech  Psychiatric:        Mood and Affect: Mood normal.        Judgment: Judgment normal.     ED Results / Procedures / Treatments   Labs (all labs ordered are listed, but only abnormal results are displayed) Labs Reviewed  COMPREHENSIVE METABOLIC PANEL - Abnormal; Notable for the following components:      Result Value   Glucose, Bld 135 (*)    All other components within normal limits  CBG MONITORING, ED - Abnormal; Notable for the following components:   Glucose-Capillary 108 (*)    All other components within normal limits  RESP PANEL BY RT PCR (RSV, FLU A&B, COVID)  CBC WITH DIFFERENTIAL/PLATELET  TROPONIN I (HIGH SENSITIVITY)  TROPONIN I (HIGH SENSITIVITY)    EKG EKG Interpretation  Date/Time:  Monday January 11 2020 08:58:12 EST Ventricular Rate:  62 PR Interval:    QRS Duration: 107 QT Interval:  422 QTC Calculation: 429 R Axis:   44 Text Interpretation: Sinus rhythm 12 Lead; Mason-Likar some artifact but overall similar to previous Confirmed by Frederick Peers [phone]) on 01/11/2020 9:20:21 AM   Radiology DG Chest 2 View  Result Date: 01/11/2020 CLINICAL DATA:  Provided history: Intermittent chest pain for 1 week. EXAM: CHEST - 2 VIEW COMPARISON:  Prior chest radiographs 05/06/2018 and earlier. FINDINGS: Heart size within normal limits. No appreciable airspace consolidation or pulmonary edema. No evidence of pleural effusion or pneumothorax. No acute bony abnormality identified. Thoracic spondylosis. Partially imaged ACDF hardware within the lower cervical spine. Surgical clips within the right upper quadrant of the abdomen. IMPRESSION: No evidence of acute cardiopulmonary abnormality. Electronically  Signed   By: Ronaldo Miyamoto  Golden DO   On: 01/11/2020 10:08    Procedures Procedures (including critical care time)  Medications Ordered in ED Medications - No data to display  ED Course  I have reviewed the triage vital signs and the nursing notes.  Pertinent labs & imaging results that were available during my care of the patient were reviewed by me and considered in my medical decision making (see chart for details).    MDM Rules/Calculators/A&P                          Well-appearing on exam, mildly hypertensive but patient currently denying any chest pain.  EKG similar to previous.  Chest x-ray clear.  Pain comes and goes sporadically and he has periods of time that he is completely chest pain-free therefore doubt aortic dissection.  Labs show normal CBC, CMP and trop x 2. Consulted cardiology due to concern for hypertensive emergency vs ACS. DR. Duke Salviaandolph has recommended coronary CTA, which [facility] after patient receives contrast allergy pre-treatment. [facility], thus will be admitted here and transported tomorrow for study. Discussed admission w/ [facility], Dr. Dairl PonderSegal.  Final Clinical Impression(s) / ED Diagnoses Final diagnoses:  Hypertensive urgency  Nonspecific chest pain    Rx / DC Orders ED Discharge Orders    None       Marykathryn Carboni, Ambrose Finlandachel Morgan, MD 01/11/20 1718

## 2020-01-11 NOTE — Consult Note (Addendum)
Cardiology Consultation:   Patient ID: Dwayne Vargas; [id number]; 1945/04/26   Admit date: 01/11/2020 Date of Consult: 01/11/2020  Primary Care Provider: Henrine Screws, MD is part-time, has appt w/ new MD January 2022 Primary Cardiologist: Chilton Si, MD New Primary Electrophysiologist:  None   Patient Profile:   Dwayne Vargas is a 74 y.o. male with a hx of DM, HTN, Bipolar, BPH, back issues causing chronic pain issues, who is being seen today for the evaluation of chest pain at the request of Dr Clarene Duke.  History of Present Illness:   Dwayne Vargas reportedly had a cardiac cath years 30 ago, clean. He was being evaluated because his heart was stopping. No info in available records.   Dwayne Vargas had back surgery 05/2019 and has issues w/ it, needs another surgery 12/05. Has been having severe pain issues for the last few months. He separated from his wife a week ago. He also has a torn R rotator cuff, getting steroid shots for that. All of these are stressors that he believes are elevating his BP  He is aware that his BP has been running higher than usual. Highest reading was 214/91, this was first thing am. The lowest number was 177/78. However, he just started checking it last week.   His surgeon noticed his BP was running higher than usual, w/ SBP 150s in October. He saw his PCP, no med changes.   This is the first time he has had chest pain.   The chest pain, pressure, 3/10 started last Monday afternoon. He gets some L arm numbness with this. He does not take any rx with this. The pain will last until bedtime. He takes rx to sleep, the pain resolves during the night, he does not wake with it.   He has gotten chest pain every afternoon since then. Tried walking, it did not change the pain at all. He will generally be laying down after lunch when it starts, but eating dinner does not change the pain.    Past Medical History:  Diagnosis Date  . Anxiety   . Benign localized prostatic  hyperplasia with lower urinary tract symptoms (LUTS)   . Bipolar 1 disorder (HCC)   . Complication of anesthesia    limited neck motion due to cervical spine surgery  . Depression   . ED (erectile dysfunction) of organic origin   . History of penile cancer    s/p  excision of cancer 2013  . HOH (hard of hearing)    left ear  . Hypertension   . Irritable bowel    intermittant diarrhea  secondary to antibiotics  . Legally blind in left eye, as defined in [place]   . Stricture, prostate    urethra  . Type 2 diabetes mellitus (HCC)   . Weak urinary stream    intermittant    Past Surgical History:  Procedure Laterality Date  . CATARACT EXTRACTION W/ INTRAOCULAR LENS  IMPLANT, BILATERAL  2007  . CERVICAL FUSION  05/ 2016   [facility]   C2 -- C4  . CHOLECYSTECTOMY  2013  approx  . EXCISION PENILE CANCER  2013  . NASAL SEPTUM SURGERY  x2  last one 1990's  . SHOULDER ARTHROSCOPY WITH ROTATOR CUFF REPAIR Left 1990's  . TOTAL KNEE ARTHROPLASTY Bilateral left 2007/  right and revision in 2009  . TRANSURETHRAL INCISION OF PROSTATE N/A 11/21/2015   Procedure: TRANSURETHRAL INCISION OF THE PROSTATE (TUIP);  Surgeon: Hildred Laser, MD;  [facility];  Service: Urology;  Laterality: N/A;  . TRANSURETHRAL RESECTION OF PROSTATE N/A 07/22/2015   Procedure: TRANSURETHRAL RESECTION OF THE PROSTATE ;  Surgeon: Hildred Laser, MD;  Location: [facility];  Service: Urology;  Laterality: N/A;  . VIDEO ASSISTED THORACOSCOPY (VATS)/THOROCOTOMY Left 08/12/2014   Procedure: VIDEO ASSISTED THORACOSCOPY (VATS)/THOROCOTOMY, DRAINAGE OF EMPYEMA;  Surgeon: Alleen Borne, MD;  Location: [facility];  Service: Thoracic;  Laterality: Left;     Prior to Admission medications   Medication Sig Start Date End Date Taking? Authorizing Provider  acetaminophen (TYLENOL) 500 MG tablet Take 500 mg by mouth every 6 (six) hours as needed for moderate pain.    [provider]  Baclofen 5 MG TABS Take 5 mg  by mouth 2 (two) times daily as needed for pain. 01/05/20   [provider]  diclofenac (VOLTAREN) 75 MG EC tablet Take 75 mg by mouth 2 (two) times daily. 01/05/20   [provider]  gabapentin (NEURONTIN) 100 MG capsule Take 100 mg by mouth at bedtime. 01/26/19   [provider]  glipiZIDE (GLUCOTROL XL) 5 MG 24 hr tablet Take 1 tablet (5 mg total) by mouth daily with breakfast. 06/05/17   Reather Littler, MD  JANUVIA 100 MG tablet TAKE 1 TABLET BY MOUTH EVERY DAY Patient taking differently: Take 50 mg by mouth daily.  02/27/18   Reather Littler, MD  JANUVIA 50 MG tablet Take 50 mg by mouth daily. 12/09/19   [provider]  lamoTRIgine (LAMICTAL) 200 MG tablet Take 200 mg by mouth at bedtime. 07/29/14   [provider]  losartan (COZAAR) 25 MG tablet Take 25 mg by mouth at bedtime.  08/10/14   [provider]  Melatonin 5 MG TABS Take 1 tablet by mouth at bedtime.    [provider]  meloxicam (MOBIC) 15 MG tablet Take 15 mg by mouth daily as needed for muscle spasms. 12/12/18   [provider]  metoprolol tartrate (LOPRESSOR) 25 MG tablet Take 1 tablet (25 mg total) by mouth 2 (two) times daily. 08/18/14   Ardelle Balls, PA-C  Multiple Vitamin (MULTIVITAMIN WITH MINERALS) TABS tablet Take 1 tablet by mouth daily.    [provider]  rosuvastatin (CRESTOR) 10 MG tablet Take 10 mg by mouth every evening.     [provider]  traMADol (ULTRAM) 50 MG tablet Take 50-100 mg by mouth 2 (two) times daily as needed for pain. 01/26/19   [provider]  traZODone (DESYREL) 50 MG tablet Take 100 mg by mouth at bedtime.     [provider]  ziprasidone (GEODON) 40 MG capsule Take 40 mg by mouth at bedtime. 01/19/19   [provider]    Inpatient Medications: Scheduled Meds:  Continuous Infusions:  PRN Meds:   Allergies:    Allergies  Allergen Reactions  . Aspirin Other (See Comments)     GI upset  . Contrast Media [Iodinated Diagnostic Agents] Hives and Swelling  . Fentanyl     Pt wishes not to have  . Morphine And Related Other (See Comments)    Tremors   . Oxycontin [Oxycodone Hcl] Other (See Comments)    "pt does not wish to have"  . Shellfish Allergy Hives    All fish and shellfish w/ iodine  . Diazepam Other (See Comments)    Patient does not want to use    Social History:   Social History   Socioeconomic History  . Marital status: Widowed  Spouse name: Not on file  . Number of children: Not on file  . Years of education: Not on file  . Highest education level: Not on file  Occupational History  . Not on file  Tobacco Use  . Smoking status: Former Smoker    Packs/day: 1.00    Years: 53.00    Pack years: 53.00    Types: Cigarettes    Quit date: 2019    Years since quitting: 2.8  . Smokeless tobacco: Never Used  Substance and Sexual Activity  . Alcohol use: No    Alcohol/week: 0.0 standard drinks  . Drug use: No  . Sexual activity: Never  Other Topics Concern  . Not on file  Social History Narrative  . Not on file   Social Determinants of Health   Financial Resource Strain:   . Difficulty of Paying Living Expenses: Not on file  Food Insecurity:   . Worried About Programme researcher, broadcasting/film/videounning Out of Food in the Last Year: Not on file  . Ran Out of Food in the Last Year: Not on file  Transportation Needs:   . Lack of Transportation (Medical): Not on file  . Lack of Transportation (Non-Medical): Not on file  Physical Activity:   . Days of Exercise per Week: Not on file  . Minutes of Exercise per Session: Not on file  Stress:   . Feeling of Stress : Not on file  Social Connections:   . Frequency of Communication with Friends and Family: Not on file  . Frequency of Social Gatherings with Friends and Family: Not on file  . Attends Religious Services: Not on file  . Active Member of Clubs or Organizations: Not on file  . Attends BankerClub or Organization Meetings:  Not on file  . Marital Status: Not on file  Intimate Partner Violence:   . Fear of Current or Ex-Partner: Not on file  . Emotionally Abused: Not on file  . Physically Abused: Not on file  . Sexually Abused: Not on file    Family History:   Family History  Problem Relation Age of Onset  . Diabetes Cousin    Family Status:  Family Status  Relation Name Status  . Cousin  (Not Specified)    ROS:  Please see the history of present illness.  All other ROS reviewed and negative.     Physical Exam/Data:   Vitals:   01/11/20 0901 01/11/20 0915 01/11/20 0930 01/11/20 1030  BP: (!) 189/89 (!) 167/82 (!) 157/72 (!) 172/78  Pulse: 60 (!) 58 (!) 52 (!) 53  Resp: 20 18 14 13   Temp: 98.9 F (37.2 C)     TempSrc: Oral     SpO2: 100% 100% 98% 99%   No intake or output data in the 24 hours ending 01/11/20 1138  Last 3 Weights 01/27/2019 08/30/2017 08/26/2017  Weight (lbs) 203 lb 213 lb 213 lb  Weight (kg) 92.08 kg 96.616 kg 96.616 kg     There is no height or weight on file to calculate BMI.   General:  Well nourished, well developed, male in no acute distress HEENT: normal Lymph: no adenopathy Neck: JVD - not elevated Endocrine:  No thryomegaly Vascular: +R carotid bruit; 4/4 extremity pulses 2+  Cardiac:  normal S1, S2; RRR; no murmur Lungs:  clear bilaterally, no wheezing, rhonchi or rales  Abd: soft, nontender, no hepatomegaly  Ext: no edema Musculoskeletal:  No deformities, BUE and BLE strength normal and equal Skin: warm and dry  Neuro:  CNs 2-12 intact, no focal abnormalities noted Psych:  Normal affect   EKG:  The EKG was personally reviewed and demonstrates:  SR, HR 62, ?early repol but +artifact >> repeat, minor morphology changes, unclear significance Telemetry:  Telemetry was personally reviewed and demonstrates:  SR   CV studies:   NONE IN SYSTEM  Laboratory Data:   Chemistry Recent Labs  Lab 01/11/20 1018  NA 137  K 4.1  CL 101  CO2 25  GLUCOSE 135*    BUN 13  CREATININE 0.82  CALCIUM 10.1  GFRNONAA >60  ANIONGAP 11    Lab Results  Component Value Date   ALT 17 01/11/2020   AST 18 01/11/2020   ALKPHOS 70 01/11/2020   BILITOT 0.7 01/11/2020   Hematology Recent Labs  Lab 01/11/20 1018  WBC 8.6  RBC 4.66  HGB 13.7  HCT 41.5  MCV 89.1  MCH 29.4  MCHC 33.0  RDW 13.7  PLT 200   Cardiac Enzymes High Sensitivity Troponin:   Recent Labs  Lab 01/11/20 1018  TROPONINIHS 4      BNPNo results for input(s): BNP, PROBNP in the last 168 hours.  DDimer No results for input(s): DDIMER in the last 168 hours. TSH: No results found for: TSH Lipids: Lab Results  Component Value Date   CHOL 126 05/31/2017   HDL 40.20 05/31/2017   LDLCALC 47 05/31/2017   TRIG 190.0 (H) 05/31/2017   CHOLHDL 3 05/31/2017   HgbA1c: Lab Results  Component Value Date   HGBA1C 6.6 (H) 05/31/2017   Magnesium: No results found for: MG   Radiology/Studies:  DG Chest 2 View  Result Date: 01/11/2020 CLINICAL DATA:  Provided history: Intermittent chest pain for 1 week. EXAM: CHEST - 2 VIEW COMPARISON:  Prior chest radiographs 05/06/2018 and earlier. FINDINGS: Heart size within normal limits. No appreciable airspace consolidation or pulmonary edema. No evidence of pleural effusion or pneumothorax. No acute bony abnormality identified. Thoracic spondylosis. Partially imaged ACDF hardware within the lower cervical spine. Surgical clips within the right upper quadrant of the abdomen. IMPRESSION: No evidence of acute cardiopulmonary abnormality. Electronically Signed   By: Jackey Loge DO   On: 01/11/2020 10:08    Assessment and Plan:   1. Chest pain - prolonged episodes every day for the past week - not exertional but has mult CRFs - feel we need a definitive test, but has contrast allergy - MD advise on calcium score +/- MV, feel if both were negative, sx non-cardiac  2. Hypertension - according to pt, SBP 150s last month - in looking at Care  Everywhere records, SBP 150s or better for months - pta rx is losartan 25 mg qd, lopressor 25 mg bid - HR 50s at times, no BB increase, no sx from HR 50s - renal function/electrolytes are fine, will increase losartan to 50 mg qd - could add amlodipine if needed as well  3. L Carotid bruit - ck dopplers   Active Problems:   * No active hospital problems. *     For questions or updates, please contact [facility] Please consult [email or web address] for contact info under Cardiology/STEMI.   SignedTheodore Demark, PA-C  01/11/2020 11:38 AM

## 2020-01-12 ENCOUNTER — Observation Stay (HOSPITAL_COMMUNITY): Payer: Medicare PPO

## 2020-01-12 DIAGNOSIS — I2510 Atherosclerotic heart disease of native coronary artery without angina pectoris: Secondary | ICD-10-CM | POA: Diagnosis not present

## 2020-01-12 DIAGNOSIS — I700 Atherosclerosis of aorta: Secondary | ICD-10-CM | POA: Diagnosis not present

## 2020-01-12 DIAGNOSIS — R0789 Other chest pain: Secondary | ICD-10-CM | POA: Diagnosis not present

## 2020-01-12 DIAGNOSIS — I6523 Occlusion and stenosis of bilateral carotid arteries: Secondary | ICD-10-CM | POA: Diagnosis not present

## 2020-01-12 DIAGNOSIS — E7800 Pure hypercholesterolemia, unspecified: Secondary | ICD-10-CM | POA: Diagnosis not present

## 2020-01-12 DIAGNOSIS — I160 Hypertensive urgency: Secondary | ICD-10-CM | POA: Diagnosis not present

## 2020-01-12 DIAGNOSIS — I10 Essential (primary) hypertension: Secondary | ICD-10-CM | POA: Diagnosis not present

## 2020-01-12 LAB — CBC
HCT: 42.9 % (ref 39.0–52.0)
Hemoglobin: 14 g/dL (ref 13.0–17.0)
MCH: 28.7 pg (ref 26.0–34.0)
MCHC: 32.6 g/dL (ref 30.0–36.0)
MCV: 88.1 fL (ref 80.0–100.0)
Platelets: 210 10*3/uL (ref 150–400)
RBC: 4.87 MIL/uL (ref 4.22–5.81)
RDW: 13.7 % (ref 11.5–15.5)
WBC: 7.6 10*3/uL (ref 4.0–10.5)
nRBC: 0 % (ref 0.0–0.2)

## 2020-01-12 LAB — LIPID PANEL
Cholesterol: 145 mg/dL (ref 0–200)
HDL: 47 mg/dL (ref >40)
LDL Cholesterol: 79 mg/dL (ref 0–99)
Total CHOL/HDL Ratio: 3.1 RATIO
Triglycerides: 95 mg/dL (ref <150)
VLDL: 19 mg/dL (ref 0–40)

## 2020-01-12 LAB — GLUCOSE, CAPILLARY
Glucose-Capillary: 169 mg/dL — ABNORMAL HIGH (ref 70–99)
Glucose-Capillary: 160 mg/dL — ABNORMAL HIGH (ref 70–99)

## 2020-01-12 LAB — BASIC METABOLIC PANEL
Anion gap: 7 (ref 5–15)
BUN: 14 mg/dL (ref 8–23)
CO2: 24 mmol/L (ref 22–32)
Calcium: 9.9 mg/dL (ref 8.9–10.3)
Chloride: 104 mmol/L (ref 98–111)
Creatinine, Ser: 0.85 mg/dL (ref 0.61–1.24)
GFR, Estimated: >60 mL/min (ref >60)
Glucose, Bld: 177 mg/dL — ABNORMAL HIGH (ref 70–99)
Potassium: 4.1 mmol/L (ref 3.5–5.1)
Sodium: 135 mmol/L (ref 135–145)

## 2020-01-12 LAB — MAGNESIUM: Magnesium: 2 mg/dL (ref 1.7–2.4)

## 2020-01-12 MED ORDER — METOPROLOL TARTRATE 5 MG/5ML IV SOLN
5.0000 mg | INTRAVENOUS | Status: DC | PRN
  Administered 2020-01-12: 5 mg via INTRAVENOUS

## 2020-01-12 MED ORDER — ROSUVASTATIN CALCIUM 20 MG PO TABS: 20.0000 mg | ORAL_TABLET | Freq: Every evening | ORAL | Status: DC

## 2020-01-12 MED ORDER — NITROGLYCERIN 0.4 MG SL SUBL
0.8000 mg | SUBLINGUAL_TABLET | Freq: Once | SUBLINGUAL | Status: AC
  Administered 2020-01-12: 0.8 mg via SUBLINGUAL

## 2020-01-12 MED ORDER — IOHEXOL 350 MG/ML SOLN
80.0000 mL | Freq: Once | INTRAVENOUS | Status: AC | PRN
  Administered 2020-01-12: 80 mL via INTRAVENOUS

## 2020-01-12 MED ORDER — ROSUVASTATIN CALCIUM 20 MG PO TABS: 20.0000 mg | ORAL_TABLET | Freq: Every evening | ORAL | 0 refills | Status: DC

## 2020-01-12 MED ORDER — LOSARTAN POTASSIUM 50 MG PO TABS: 50.0000 mg | ORAL_TABLET | Freq: Every day | ORAL | 0 refills | Status: DC

## 2020-01-12 MED ORDER — AMLODIPINE BESYLATE 5 MG PO TABS: 5.0000 mg | ORAL_TABLET | Freq: Every day | ORAL | 0 refills | Status: DC

## 2020-01-12 MED ORDER — SODIUM CHLORIDE 0.9 % IV SOLN: INTRAVENOUS | Status: DC

## 2020-01-12 MED ORDER — METOPROLOL TARTRATE 50 MG PO TABS
100.0000 mg | ORAL_TABLET | Freq: Once | ORAL | Status: AC
  Administered 2020-01-12: 100 mg via ORAL
  Filled 2020-01-12: qty 2

## 2020-01-12 MED ORDER — DIPHENHYDRAMINE HCL 50 MG PO CAPS
ORAL_CAPSULE | ORAL | Status: AC
  Administered 2020-01-12: 50 mg via ORAL
  Filled 2020-01-12: qty 1

## 2020-01-12 NOTE — Plan of Care (Signed)
  Problem: Clinical Measurements: Goal: Ability to maintain clinical measurements within normal limits will improve Outcome: Progressing   Problem: Coping: Goal: Level of anxiety will decrease Outcome: Progressing   Problem: Pain Managment: Goal: General experience of comfort will improve Outcome: Progressing   

## 2020-01-12 NOTE — Care Management Obs Status (Signed)
MEDICARE OBSERVATION STATUS NOTIFICATION   Patient Details  Name: Ean Gettel MRN: [id number] Date of Birth: 1946-01-03   Medicare Observation Status Notification Given:  Yes    Lanier Clam, RN 01/12/2020, 3:38 PM

## 2020-01-12 NOTE — Discharge Summary (Addendum)
Discharge Summary  Dwayne Vargas EHM:[id number] DOB: [birth year]  PCP: Henrine Screws, MD  Admit date: 01/11/2020 Discharge date: 01/12/2020  Time spent: 35 minutes  Recommendations for Outpatient Follow-up:  1. Follow-up with cardiology 2. Follow-up with your primary care provider 3. Follow-up with your psychiatrist 4. Take your medications as prescribed  Discharge Diagnoses:  Active Hospital Problems   Diagnosis Date Noted  . Atypical chest pain   . Hypertensive urgency   . Bipolar disorder (HCC) 09/01/2017  . Diabetes mellitus without complication (HCC) 08/10/2014  . HTN (hypertension) 08/10/2014    Resolved Hospital Problems  No resolved problems to display.    Discharge Condition: Stable  Diet recommendation: Heart healthy carb modified diet.  Vitals:   01/12/20 1015 01/12/20 1353  BP:  131/68  Pulse:    Resp: 18 19  Temp:  97.8 F (36.6 C)  SpO2:  95%    History of present illness:  This is a 74 year old male with past medical history of bipolar disorder, hypertension, type 2 diabetes, BPH who presented with intermittent chest pain which has been associated with elevated blood pressures for the past 1 week.  Mild left chest pressure with radiation down the left arm.  Pain goes away when blood pressure comes down.  Not associated with exertion and no nausea, vomiting, diuresis or shortness of breath.  Currently on metoprolol twice a day and is compliant.  No known personal history of heart disease.  Reports grandfather has a history of heart disease.  No alcohol, tobacco or drug use.  He had stopped taking his losartan as he has been trying to get rid of unnecessary medications. States he has unwanted sexual side effects and fatigue from his lopressor.  Due to presenting bradycardia with heart rate in the low 50s, Lopressor was held.  Cardiology restarted losartan at 50 mg daily.  CXR and bilateral carotid duplex ultrasound unremarkable.  Cardiology consulted and  following.  Completed cardiac CTA on 01/12/2020, read by cardiology, Dr. Duke Salvia, and notable for non obstructive coronary artery disease.  Crestor dose increased by cardiology.  He will follow up with cardiology in the clinic.  01/12/20: Seen and examined.  Had chest pain last night but none at the time of this visit.  Troponin testing negative x2.  No evidence of acute ischemia on twelve-lead EKG.   Hospital Course:  Principal Problem:   Atypical chest pain Active Problems:   Diabetes mellitus without complication (HCC)   HTN (hypertension)   Bipolar disorder (HCC)   Hypertensive urgency  Atypical chest pain, suspected secondary to hypertensive urgency  Seen by cardiology.  Resumed losartan and added amlodipine.  Cardiology stopped metoprolol due to bradycardia.   Completed coronary CTA with precontrast medication for contrast allergy on 01/12/2020, read by cardiology, Dr. Duke Salvia, and notable for non obstructive coronary artery disease.   Crestor dose increased by cardiology.  He will follow up with cardiology in the clinic.  Hyperglycemia a. Hemoglobin A1c 5.6 on 01/11/2020. b. Lifestyle modification is recommended c. Follow-up with your primary care provider  Chronic back pain a. Continue home pain regimen b. Follow-up with your provider  Hyperlipidemia a. Continue home statin b. LDL 79 on 01/12/2020  5. Bipolar disorder a. Continue home trazodone and Lamictal b. Follow-up with your psychiatrist      Procedures:  None.  Consultations:  Cardiology.  Discharge Exam: BP 131/68 (BP Location: Right Arm)   Pulse (!) 102   Temp 97.8 F (36.6 C) (Oral)   Resp 19  Ht 5\' 11"  (1.803 m)   Wt 85.7 kg   SpO2 95%   BMI 26.36 kg/m  . General: 74 y.o. year-old male well developed well nourished in no acute distress.  Alert and oriented x3. . Cardiovascular: Regular rate and rhythm with no rubs or gallops.  No thyromegaly or JVD noted.   61 Respiratory: Clear  to auscultation with no wheezes or rales. Good inspiratory effort. . Abdomen: Soft nontender nondistended with normal bowel sounds x4 quadrants. . Musculoskeletal: No lower extremity edema. 2/4 pulses in all 4 extremities. Marland Kitchen Psychiatry: Mood is appropriate for condition and setting  Discharge Instructions You were cared for by a hospitalist during your hospital stay. If you have any questions about your discharge medications or the care you received while you were in the hospital after you are discharged, you can call the unit and asked to speak with the hospitalist on call if the hospitalist that took care of you is not available. Once you are discharged, your primary care physician will handle any further medical issues. Please note that NO REFILLS for any discharge medications will be authorized once you are discharged, as it is imperative that you return to your primary care physician (or establish a relationship with a primary care physician if you do not have one) for your aftercare needs so that they can reassess your need for medications and monitor your lab values.   Allergies as of 01/12/2020      Reactions   Aspirin Other (See Comments)   GI upset   Contrast Media [iodinated Diagnostic Agents] Hives, Swelling   Fentanyl    Pt wishes not to have   Morphine And Related Other (See Comments)   Tremors    Oxycontin [oxycodone Hcl] Other (See Comments)   "pt does not wish to have"   Shellfish Allergy Hives   All fish and shellfish w/ iodine   Diazepam Other (See Comments)   Patient does not want to use      Medication List    STOP taking these medications   diclofenac 75 MG EC tablet Commonly known as: VOLTAREN   glipiZIDE 5 MG 24 hr tablet Commonly known as: GLUCOTROL XL   ibuprofen 200 MG tablet Commonly known as: ADVIL   Januvia 100 MG tablet Generic drug: sitaGLIPtin   metoprolol tartrate 25 MG tablet Commonly known as: LOPRESSOR     TAKE these medications     amLODipine 5 MG tablet Commonly known as: NORVASC Take 1 tablet (5 mg total) by mouth daily. Start taking on: January 13, 2020   Baclofen 5 MG Tabs Take 5 mg by mouth 2 (two) times daily as needed for pain.   fluticasone 50 MCG/ACT nasal spray Commonly known as: FLONASE Place 1-2 sprays into both nostrils daily as needed for allergies or rhinitis.   gabapentin 100 MG capsule Commonly known as: NEURONTIN Take 200 mg by mouth at bedtime.   lamoTRIgine 200 MG tablet Commonly known as: LAMICTAL Take 200 mg by mouth at bedtime.   losartan 50 MG tablet Commonly known as: COZAAR Take 1 tablet (50 mg total) by mouth daily. Start taking on: January 13, 2020 What changed:   medication strength  how much to take  when to take this   multivitamin with minerals Tabs tablet Take 1 tablet by mouth daily.   rosuvastatin 20 MG tablet Commonly known as: CRESTOR Take 1 tablet (20 mg total) by mouth every evening. What changed:   medication strength  how  much to take   traZODone 50 MG tablet Commonly known as: DESYREL Take 100 mg by mouth at bedtime.      Allergies  Allergen Reactions  . Aspirin Other (See Comments)    GI upset  . Contrast Media [Iodinated Diagnostic Agents] Hives and Swelling  . Fentanyl     Pt wishes not to have  . Morphine And Related Other (See Comments)    Tremors   . Oxycontin [Oxycodone Hcl] Other (See Comments)    "pt does not wish to have"  . Shellfish Allergy Hives    All fish and shellfish w/ iodine  . Diazepam Other (See Comments)    Patient does not want to use    Follow-up Information    Henrine Screws, MD. Call in 1 day(s).   Specialty: Family Medicine Why: please call for your post hospital follow up appointment. Contact information: [address], [address] [place] [address] [phone]        Chilton Si, MD .   Specialty: Cardiology Contact information: [address] [place]  [address] [phone]                The results of significant diagnostics from this hospitalization (including imaging, microbiology, ancillary and laboratory) are listed below for reference.    Significant Diagnostic Studies: DG Chest 2 View  Result Date: 01/11/2020 CLINICAL DATA:  Provided history: Intermittent chest pain for 1 week. EXAM: CHEST - 2 VIEW COMPARISON:  Prior chest radiographs 05/06/2018 and earlier. FINDINGS: Heart size within normal limits. No appreciable airspace consolidation or pulmonary edema. No evidence of pleural effusion or pneumothorax. No acute bony abnormality identified. Thoracic spondylosis. Partially imaged ACDF hardware within the lower cervical spine. Surgical clips within the right upper quadrant of the abdomen. IMPRESSION: No evidence of acute cardiopulmonary abnormality. Electronically Signed   By: Jackey Loge DO   On: 01/11/2020 10:08   CT CORONARY MORPH W/CTA COR W/SCORE W/CA W/CM &/OR WO/CM  Addendum Date: 01/12/2020   ADDENDUM REPORT: 01/12/2020 15:11 EXAM: Cardiac/Coronary  CT TECHNIQUE: The patient was scanned on a [employer]. FINDINGS: A 120 kV prospective scan was triggered in the descending thoracic aorta at 111 HU's. Axial non-contrast 3 mm slices were carried out through the heart. The data set was analyzed on a dedicated work station and scored using the Agatson method. Gantry rotation speed was 250 msecs and collimation was .6 mm. No beta blockade and 0.8 mg of sl NTG was given. The 3D data set was reconstructed in 5% intervals of the 67-82 % of the R-R cycle. Diastolic phases were analyzed on a dedicated work station using MPR, MIP and VRT modes. The patient received 80 cc of contrast. Aorta: Normal size. Ascending aorta 3.5 cm. Mild calcification of the descending aorta. No dissection. Aortic Valve:  Trileaflet.  Mild calcification. Coronary Arteries:  Normal coronary origin.  Right dominance. RCA is a large dominant artery that  gives rise to PDA and PLVB. There is diffuse, mild (25-49%) plaque. Left main is a large artery that gives rise to LAD, RI, and LCX arteries. There is minimal (<25%) calcified plaque. LAD is a large vessel that has minimal (<25%) calcified plaque in the proximal and mid vessel. There is a small D1, normal D2, and small D3. There is no plaque in the diagonal vessels. RI is a medium vessel with moderate (50-69%) calcified plaque. LCX is a small non-dominant artery that gives rise to a small OM1 branch.  There is minimal (<25%) plaque. Other findings: Normal pulmonary vein drainage into the left atrium. There are three pulmonary veins on the right and two on the left. Normal let atrial appendage without a thrombus. Normal size of the pulmonary artery. IMPRESSION: 1. Coronary calcium score of 1355. This was 85th percentile for age and sex matched control. 2. Normal coronary origin with right dominance. 3. There is mild plaque in the RCA, moderate in the RI and minimal in RCA and LCX. CAD-RADS 3. 4. Recommend aggressive risk factor modification including LDL goal <70. Chilton Si, MD Electronically Signed   By: Chilton Si   On: 01/12/2020 15:11   Result Date: 01/12/2020 EXAM: OVER-READ INTERPRETATION  CT CHEST The following report is an over-read performed by radiologist Dr. Trudie Reed of [facility] on 01/12/2020. This over-read does not include interpretation of cardiac or coronary anatomy or pathology. The coronary calcium score/coronary CTA interpretation by the cardiologist is attached. COMPARISON:  Chest CT 08/10/2014. FINDINGS: Aortic atherosclerosis. The areas of dependent subsegmental atelectasis are noted in the lower lobes of the lungs bilaterally. Within the visualized portions of the thorax there are no suspicious appearing pulmonary nodules or masses, there is no acute consolidative airspace disease, no pleural effusions, no pneumothorax and no lymphadenopathy. Visualized  portions of the upper abdomen are unremarkable. There are no aggressive appearing lytic or blastic lesions noted in the visualized portions of the skeleton. IMPRESSION: 1.  Aortic Atherosclerosis (ICD10-I70.0). Electronically Signed: By: Trudie Reed M.D. On: 01/12/2020 12:52   VAS US CAROTID  Result Date: 01/11/2020 Carotid Arterial Duplex Study Indications:      Right bruit. Risk Factors:     Hypertension, hyperlipidemia, Diabetes, past history of                   smoking. Comparison Study: No prior studies. Performing Technologist: Jean Rosenthal  Examination Guidelines: A complete evaluation includes B-mode imaging, spectral Doppler, color Doppler, and power Doppler as needed of all accessible portions of each vessel. Bilateral testing is considered an integral part of a complete examination. Limited examinations for reoccurring indications may be performed as noted.  Right Carotid Findings: +----------+--------+--------+--------+-------------------------+--------+           PSV cm/sEDV cm/sStenosisPlaque Description       Comments +----------+--------+--------+--------+-------------------------+--------+ CCA Prox  103     19                                                +----------+--------+--------+--------+-------------------------+--------+ CCA Distal79      18              calcific                          +----------+--------+--------+--------+-------------------------+--------+ ICA Prox  143     37      1-39%   calcific and heterogenous         +----------+--------+--------+--------+-------------------------+--------+ ICA Mid   120     35                                                +----------+--------+--------+--------+-------------------------+--------+ ICA Distal119     35                                                +----------+--------+--------+--------+-------------------------+--------+  ECA       85      12                                                 +----------+--------+--------+--------+-------------------------+--------+ +----------+--------+-------+----------------+-------------------+           PSV cm/sEDV cmsDescribe        Arm Pressure (mmHG) +----------+--------+-------+----------------+-------------------+ [id number]            Multiphasic, WNL                    +----------+--------+-------+----------------+-------------------+ +---------+--------+--+--------+--+---------+ VertebralPSV cm/s63EDV cm/s19Antegrade +---------+--------+--+--------+--+---------+  Left Carotid Findings: +----------+--------+--------+--------+------------------+--------+           PSV cm/sEDV cm/sStenosisPlaque DescriptionComments +----------+--------+--------+--------+------------------+--------+ CCA Prox  104     21                                         +----------+--------+--------+--------+------------------+--------+ CCA Distal69      21                                         +----------+--------+--------+--------+------------------+--------+ ICA Prox  116     36      1-39%   heterogenous               +----------+--------+--------+--------+------------------+--------+ ICA Mid   124     39                                         +----------+--------+--------+--------+------------------+--------+ ICA Distal112     29                                         +----------+--------+--------+--------+------------------+--------+ ECA       81      11                                         +----------+--------+--------+--------+------------------+--------+ +----------+--------+--------+----------------+-------------------+           PSV cm/sEDV cm/sDescribe        Arm Pressure (mmHG) +----------+--------+--------+----------------+-------------------+ [id number]             Multiphasic, WNL                    +----------+--------+--------+----------------+-------------------+  +---------+--------+--+--------+--+---------+ VertebralPSV cm/s68EDV cm/s15Antegrade +---------+--------+--+--------+--+---------+   Summary: Right Carotid: Velocities in the right ICA are consistent with a 1-39% stenosis.                Borderline >40%. Left Carotid: Velocities in the left ICA are consistent with a 1-39% stenosis. Vertebrals:  Bilateral vertebral arteries demonstrate antegrade flow. Subclavians: Normal flow hemodynamics were seen in bilateral subclavian              arteries. *See table(s) above for measurements and observations.  Electronically signed by Lemar LivingsBrandon Cain MD on 01/11/2020 at 4:30:12 PM.    Final  Microbiology: Recent Results (from the past 240 hour(s))  Resp Panel by RT PCR (RSV, Flu A&B, Covid) - Nasopharyngeal Swab     Status: None   Collection Time: 01/11/20  4:01 PM   Specimen: Nasopharyngeal Swab  Result Value Ref Range Status   SARS Coronavirus 2 by RT PCR NEGATIVE NEGATIVE Final    Comment: (NOTE) SARS-CoV-2 target nucleic acids are NOT DETECTED.  The SARS-CoV-2 RNA is generally detectable in upper respiratoy specimens during the acute phase of infection. The lowest concentration of SARS-CoV-2 viral copies this assay can detect is 131 copies/mL. A negative result does not preclude SARS-Cov-2 infection and should not be used as the sole basis for treatment or other patient management decisions. A negative result may occur with  improper specimen collection/handling, submission of specimen other than nasopharyngeal swab, presence of viral mutation(s) within the areas targeted by this assay, and inadequate number of viral copies (<131 copies/mL). A negative result must be combined with clinical observations, patient history, and epidemiological information. The expected result is Negative.  Fact Sheet for Patients:  https://www.moore.com/  Fact Sheet for Healthcare Providers:   https://www.young.biz/  This test is no t yet approved or cleared by the [place] FDA and  has been authorized for detection and/or diagnosis of SARS-CoV-2 by FDA under an Emergency Use Authorization (EUA). This EUA will remain  in effect (meaning this test can be used) for the duration of the COVID-19 declaration under Section 564(b)(1) of the Act, 21 U.S.C. section 360bbb-3(b)(1), unless the authorization is terminated or revoked sooner.     Influenza A by PCR NEGATIVE NEGATIVE Final   Influenza B by PCR NEGATIVE NEGATIVE Final    Comment: (NOTE) The Xpert Xpress SARS-CoV-2/FLU/RSV assay is intended as an aid in  the diagnosis of influenza from Nasopharyngeal swab specimens and  should not be used as a sole basis for treatment. Nasal washings and  aspirates are unacceptable for Xpert Xpress SARS-CoV-2/FLU/RSV  testing.  Fact Sheet for Patients: https://www.moore.com/  Fact Sheet for Healthcare Providers: https://www.young.biz/  This test is not yet approved or cleared by the [place] FDA and  has been authorized for detection and/or diagnosis of SARS-CoV-2 by  FDA under an Emergency Use Authorization (EUA). This EUA will remain  in effect (meaning this test can be used) for the duration of the  Covid-19 declaration under Section 564(b)(1) of the Act, 21  U.S.C. section 360bbb-3(b)(1), unless the authorization is  terminated or revoked.    Respiratory Syncytial Virus by PCR NEGATIVE NEGATIVE Final    Comment: (NOTE) Fact Sheet for Patients: https://www.moore.com/  Fact Sheet for Healthcare Providers: https://www.young.biz/  This test is not yet approved or cleared by the [place] FDA and  has been authorized for detection and/or diagnosis of SARS-CoV-2 by  FDA under an Emergency Use Authorization (EUA). This EUA will remain  in effect (meaning this test can be  used) for the duration of the  COVID-19 declaration under Section 564(b)(1) of the Act, 21 U.S.C.  section 360bbb-3(b)(1), unless the authorization is terminated or  revoked. Performed at [facility], [address], [place], [place] [address]      Labs: Basic Metabolic Panel: Recent Labs  Lab 01/11/20 1018 01/12/20 0533  NA 137 135  K 4.1 4.1  CL 101 104  CO2 25 24  GLUCOSE 135* 177*  BUN 13 14  CREATININE 0.82 0.85  CALCIUM 10.1 9.9  MG  --  2.0   Liver Function Tests:  Recent Labs  Lab 01/11/20 1018  AST 18  ALT 17  ALKPHOS 70  BILITOT 0.7  PROT 7.5  ALBUMIN 4.4   No results for input(s): LIPASE, AMYLASE in the last 168 hours. No results for input(s): AMMONIA in the last 168 hours. CBC: Recent Labs  Lab 01/11/20 1018 01/12/20 0533  WBC 8.6 7.6  NEUTROABS 6.2  --   HGB 13.7 14.0  HCT 41.5 42.9  MCV 89.1 88.1  PLT 200 210   Cardiac Enzymes: No results for input(s): CKTOTAL, CKMB, CKMBINDEX, TROPONINI in the last 168 hours. BNP: BNP (last 3 results) No results for input(s): BNP in the last 8760 hours.  ProBNP (last 3 results) No results for input(s): PROBNP in the last 8760 hours.  CBG: Recent Labs  Lab 01/11/20 1402 01/11/20 2145 01/12/20 0817 01/12/20 1210  GLUCAP 108* 123* 169* 160*       Signed:  Darlin Drop, MD [facility] 01/12/2020, 3:25 PM

## 2020-01-12 NOTE — Progress Notes (Signed)
Progress Note  Patient Name: Dwayne Vargas Date of Encounter: 01/12/2020  Primary Cardiologist:  Chilton Si, MD  Subjective   Feeling much better, upset that he did not sleep much last pm because staff was checking on him No chest pain now.  Inpatient Medications    Scheduled Meds: . amLODipine  5 mg Oral Daily  . diphenhydrAMINE  50 mg Oral Once  . enoxaparin (LOVENOX) injection  40 mg Subcutaneous Q24H  . gabapentin  200 mg Oral QHS  . insulin aspart  0-9 Units Subcutaneous TID WC  . lamoTRIgine  200 mg Oral QHS  . losartan  50 mg Oral Daily  . predniSONE  50 mg Oral Q6H  . rosuvastatin  10 mg Oral QPM  . sodium chloride flush  3 mL Intravenous Q12H  . traZODone  100 mg Oral QHS   Continuous Infusions: . sodium chloride 75 mL/hr at 01/12/20 0651   PRN Meds: acetaminophen **OR** acetaminophen, hydrALAZINE, polyethylene glycol   Vital Signs    Vitals:   01/12/20 0200 01/12/20 0300 01/12/20 0325 01/12/20 0400  BP: (!) 157/71  (!) 147/76 (!) 146/72  Pulse:      Resp: 14 17  17   Temp: 97.9 F (36.6 C)     TempSrc: Oral     SpO2: 97%   99%  Weight:      Height:        Intake/Output Summary (Last 24 hours) at 01/12/2020 0757 Last data filed at 01/11/2020 2138 Gross per 24 hour  Intake 3 ml  Output --  Net 3 ml   Filed Weights   01/11/20 1925  Weight: 85.7 kg   Last Weight  Most recent update: 01/11/2020  7:26 PM   Weight  85.7 kg (189 lb)           Weight change:    Telemetry    No saved strips, currently SR, 80s - Personally Reviewed  ECG    None today - Personally Reviewed  Physical Exam   General: Well developed, well nourished, male appearing in no acute distress. Head: Normocephalic, atraumatic.  Neck: Supple without bruits, JVD not elevated. Lungs:  Resp regular and unlabored, CTA. Heart: RRR, S1, S2, no S3, S4, or murmur; no rub. Abdomen: Soft, non-tender, non-distended with normoactive bowel sounds. No hepatomegaly. No  rebound/guarding. No obvious abdominal masses. Extremities: No clubbing, cyanosis, no edema. Distal pedal pulses are 2+ bilaterally. Neuro: Alert and oriented X 3. Moves all extremities spontaneously. Psych: Normal affect.  Labs    Hematology Recent Labs  Lab 01/11/20 1018 01/12/20 0533  WBC 8.6 7.6  RBC 4.66 4.87  HGB 13.7 14.0  HCT 41.5 42.9  MCV 89.1 88.1  MCH 29.4 28.7  MCHC 33.0 32.6  RDW 13.7 13.7  PLT 200 210    Chemistry Recent Labs  Lab 01/11/20 1018 01/12/20 0533  NA 137 135  K 4.1 4.1  CL 101 104  CO2 25 24  GLUCOSE 135* 177*  BUN 13 14  CREATININE 0.82 0.85  CALCIUM 10.1 9.9  PROT 7.5  --   ALBUMIN 4.4  --   AST 18  --   ALT 17  --   ALKPHOS 70  --   BILITOT 0.7  --   GFRNONAA >60 >60  ANIONGAP 11 7     High Sensitivity Troponin:   Recent Labs  Lab 01/11/20 1018 01/11/20 1230  TROPONINIHS 4 <2     Lab Results  Component Value Date  CHOL 126 05/31/2017   HDL 40.20 05/31/2017   LDLCALC 47 05/31/2017   TRIG 190.0 (H) 05/31/2017   CHOLHDL 3 05/31/2017   No results found for: TSH Lab Results  Component Value Date   HGBA1C 5.6 01/11/2020    BNPNo results for input(s): BNP, PROBNP in the last 168 hours.   DDimer No results for input(s): DDIMER in the last 168 hours.   Radiology    DG Chest 2 View  Result Date: 01/11/2020 CLINICAL DATA:  Provided history: Intermittent chest pain for 1 week. EXAM: CHEST - 2 VIEW COMPARISON:  Prior chest radiographs 05/06/2018 and earlier. FINDINGS: Heart size within normal limits. No appreciable airspace consolidation or pulmonary edema. No evidence of pleural effusion or pneumothorax. No acute bony abnormality identified. Thoracic spondylosis. Partially imaged ACDF hardware within the lower cervical spine. Surgical clips within the right upper quadrant of the abdomen. IMPRESSION: No evidence of acute cardiopulmonary abnormality. Electronically Signed   By: Jackey Loge DO   On: 01/11/2020 10:08   VAS  US CAROTID  Result Date: 01/11/2020 Carotid Arterial Duplex Study Indications:      Right bruit. Risk Factors:     Hypertension, hyperlipidemia, Diabetes, past history of                   smoking. Comparison Study: No prior studies. Performing Technologist: Jean Rosenthal  Examination Guidelines: A complete evaluation includes B-mode imaging, spectral Doppler, color Doppler, and power Doppler as needed of all accessible portions of each vessel. Bilateral testing is considered an integral part of a complete examination. Limited examinations for reoccurring indications may be performed as noted.  Right Carotid Findings: +----------+--------+--------+--------+-------------------------+--------+           PSV cm/sEDV cm/sStenosisPlaque Description       Comments +----------+--------+--------+--------+-------------------------+--------+ CCA Prox  103     19                                                +----------+--------+--------+--------+-------------------------+--------+ CCA Distal79      18              calcific                          +----------+--------+--------+--------+-------------------------+--------+ ICA Prox  143     37      1-39%   calcific and heterogenous         +----------+--------+--------+--------+-------------------------+--------+ ICA Mid   120     35                                                +----------+--------+--------+--------+-------------------------+--------+ ICA Distal119     35                                                +----------+--------+--------+--------+-------------------------+--------+ ECA       85      12                                                +----------+--------+--------+--------+-------------------------+--------+ +----------+--------+-------+----------------+-------------------+  PSV cm/sEDV cmsDescribe        Arm Pressure (mmHG)  +----------+--------+-------+----------------+-------------------+ [id number]            Multiphasic, WNL                    +----------+--------+-------+----------------+-------------------+ +---------+--------+--+--------+--+---------+ VertebralPSV cm/s63EDV cm/s19Antegrade +---------+--------+--+--------+--+---------+  Left Carotid Findings: +----------+--------+--------+--------+------------------+--------+           PSV cm/sEDV cm/sStenosisPlaque DescriptionComments +----------+--------+--------+--------+------------------+--------+ CCA Prox  104     21                                         +----------+--------+--------+--------+------------------+--------+ CCA Distal69      21                                         +----------+--------+--------+--------+------------------+--------+ ICA Prox  116     36      1-39%   heterogenous               +----------+--------+--------+--------+------------------+--------+ ICA Mid   124     39                                         +----------+--------+--------+--------+------------------+--------+ ICA Distal112     29                                         +----------+--------+--------+--------+------------------+--------+ ECA       81      11                                         +----------+--------+--------+--------+------------------+--------+ +----------+--------+--------+----------------+-------------------+           PSV cm/sEDV cm/sDescribe        Arm Pressure (mmHG) +----------+--------+--------+----------------+-------------------+ [id number]             Multiphasic, WNL                    +----------+--------+--------+----------------+-------------------+ +---------+--------+--+--------+--+---------+ VertebralPSV cm/s68EDV cm/s15Antegrade +---------+--------+--+--------+--+---------+   Summary: Right Carotid: Velocities in the right ICA are consistent with a 1-39%  stenosis.                Borderline >40%. Left Carotid: Velocities in the left ICA are consistent with a 1-39% stenosis. Vertebrals:  Bilateral vertebral arteries demonstrate antegrade flow. Subclavians: Normal flow hemodynamics were seen in bilateral subclavian              arteries. *See table(s) above for measurements and observations.  Electronically signed by Lemar Livings MD on 01/11/2020 at 4:30:12 PM.    Final      Cardiac Studies   CARDIAC CT PENDING  CAROTID DOPPLERS: 01/11/2020  Summary:  Right Carotid: Velocities in the right ICA are consistent with a 1-39%  stenosis. Borderline >40%.   Left Carotid: Velocities in the left ICA are consistent with a 1-39%  stenosis.   Vertebrals: Bilateral vertebral arteries demonstrate antegrade flow.  Subclavians: Normal flow hemodynamics were seen in bilateral subclavian  arteries.  Patient Profile     74 y.o. male w/ hx  DM, HTN, Bipolar, BPH, back issues causing chronic pain issues, who was admitted 11/15 with chest pain.   Assessment & Plan    1. Chest pain. - he had been getting sx every night, none last pm - cardiac ez are negative, but mult CRFs - for cardiac CT today, f/u on results - he has been pre-treated for dye allergy  2. Carotid dz - non-obs plaque bilaterally, a little worse on the R - f/u in 1-2 years - goal LDL now < 70, ck profile  3. HTN - amlodipine 5 mg qd added, now off metoprolol 25 mg bid 2nd bradycardia and fatigue - losartan increased 25>>50 mg qd - BP not well controlled, but has gotten steroids, that may influence his BP - continue to follow  Otherwise, per IM Principal Problem:   Atypical chest pain Active Problems:   Diabetes mellitus without complication (HCC)   HTN (hypertension)   Bipolar disorder (HCC)   Hypertensive urgency    Signed, Theodore DemarkRhonda Yuvaan Olander , PA-C 7:57 AM 01/12/2020 Pager: [phone]

## 2020-01-12 NOTE — Care Management Obs Status (Signed)
MEDICARE OBSERVATION STATUS NOTIFICATION   Patient Details  Name: Dwayne Vargas MRN: [id number] Date of Birth: 09/20/1945   Medicare Observation Status Notification Given:  Yes    Ewin Rehberg, RN 01/12/2020, 3:38 PM 

## 2020-01-12 NOTE — Discharge Instructions (Signed)

## 2020-01-12 NOTE — Evaluation (Signed)
Physical Therapy One Time Evaluation Patient Details Name: Dwayne Vargas MRN: [id number] DOB: Dec 16, 1945 Today's Date: 01/12/2020   History of Present Illness  This is a 74 year old male with past medical history of bipolar disorder, hypertension, type 2 diabetes, BPH, VATS, multiple back surgeries who presented with intermittent chest pain which has been associated with elevated blood pressures for the past 1 week.  Clinical Impression  Patient evaluated by Physical Therapy with no further acute PT needs identified. All education has been completed and the patient has no further questions.  Pt reports falling at least once a month due to previous back issues and awaiting back surgery Dec 5th.  Pt states he does have RW and SPC at home however not using assistive device at this time.  Pt feels at his baseline prior to this admission however hopeful for more LE strength and improved balance/mobility after pending back surgery.  Pt reports his daughter can assist him upon d/c if he had any needs.  Pt hopeful for either a shower today or d/c home. See below for any follow-up Physical Therapy or equipment needs. PT is signing off. Thank you for this referral.       Follow Up Recommendations No PT follow up    Equipment Recommendations  None recommended by PT    Recommendations for Other Services       Precautions / Restrictions Precautions Precautions: Fall      Mobility  Bed Mobility Overal bed mobility: Modified Independent                  Transfers Overall transfer level: Needs assistance Equipment used: None Transfers: Sit to/from Stand Sit to Stand: Supervision            Ambulation/Gait Ambulation/Gait assistance: Supervision;Min guard Gait Distance (Feet): 200 Feet Assistive device: None Gait Pattern/deviations: Step-through pattern;Decreased stride length;Antalgic;Decreased stance time - right     General Gait Details: pt reports R LE buckles sometimes  causing falls (at least once a month for the past 7 months), min/guard for safety, HR 75-92 bpm during session, pt grazing rail as needed  Stairs            Wheelchair Mobility    Modified Rankin (Stroke Patients Only)       Balance Overall balance assessment: History of Falls                                           Pertinent Vitals/Pain Pain Assessment: No/denies pain    Home Living Family/patient expects to be discharged to:: Private residence Living Arrangements: Alone Available Help at Discharge: Family;Available PRN/intermittently Type of Home: House Home Access: Stairs to enter   Entrance Stairs-Number of Steps: 3 Home Layout: Able to live on main level with bedroom/bathroom Home Equipment: Walker - 2 wheels;Cane - quad      Prior Function Level of Independence: Independent         Comments: retired principal     Higher education careers adviser        Extremity/Trunk Assessment        Lower Extremity Assessment Lower Extremity Assessment: Generalized weakness;RLE deficits/detail RLE Deficits / Details: reports hx of weakness for 7 months due to multiple spinal surgeries       Communication   Communication: No difficulties  Cognition Arousal/Alertness: Awake/alert Behavior During Therapy: WFL for tasks assessed/performed Overall Cognitive Status: Within Functional Limits  for tasks assessed                                        General Comments      Exercises     Assessment/Plan    PT Assessment Patent does not need any further PT services  PT Problem List         PT Treatment Interventions      PT Goals (Current goals can be found in the Care Plan section)  Acute Rehab PT Goals PT Goal Formulation: All assessment and education complete, DC therapy    Frequency     Barriers to discharge        Co-evaluation               AM-PAC PT "6 Clicks" Mobility  Outcome Measure Help needed turning from  your back to your side while in a flat bed without using bedrails?: None Help needed moving from lying on your back to sitting on the side of a flat bed without using bedrails?: None Help needed moving to and from a bed to a chair (including a wheelchair)?: None Help needed standing up from a chair using your arms (e.g., wheelchair or bedside chair)?: None Help needed to walk in hospital room?: A Little Help needed climbing 3-5 steps with a railing? : A Little 6 Click Score: 22    End of Session   Activity Tolerance: Patient tolerated treatment well Patient left: with call bell/phone within reach;in bed;with nursing/sitter in room   PT Visit Diagnosis: Difficulty in walking, not elsewhere classified (R26.2)    Time: 6834-1962 PT Time Calculation (min) (ACUTE ONLY): 21 min   Charges:   PT Evaluation $PT Eval Low Complexity: 1 Low     Kati PT, DPT Acute Rehabilitation Services Pager: [phone] Office: [phone]  Maida Sale E 01/12/2020, 3:37 PM

## 2020-01-15 DIAGNOSIS — E1169 Type 2 diabetes mellitus with other specified complication: Secondary | ICD-10-CM | POA: Diagnosis not present

## 2020-01-15 DIAGNOSIS — F3171 Bipolar disorder, in partial remission, most recent episode hypomanic: Secondary | ICD-10-CM | POA: Diagnosis not present

## 2020-01-15 DIAGNOSIS — I6529 Occlusion and stenosis of unspecified carotid artery: Secondary | ICD-10-CM | POA: Diagnosis not present

## 2020-01-15 DIAGNOSIS — M5416 Radiculopathy, lumbar region: Secondary | ICD-10-CM | POA: Diagnosis not present

## 2020-01-15 DIAGNOSIS — E785 Hyperlipidemia, unspecified: Secondary | ICD-10-CM | POA: Diagnosis not present

## 2020-01-15 DIAGNOSIS — I700 Atherosclerosis of aorta: Secondary | ICD-10-CM | POA: Diagnosis not present

## 2020-01-15 DIAGNOSIS — I2510 Atherosclerotic heart disease of native coronary artery without angina pectoris: Secondary | ICD-10-CM | POA: Diagnosis not present

## 2020-01-15 DIAGNOSIS — I10 Essential (primary) hypertension: Secondary | ICD-10-CM | POA: Diagnosis not present

## 2020-01-15 DIAGNOSIS — R079 Chest pain, unspecified: Secondary | ICD-10-CM | POA: Diagnosis not present

## 2020-01-18 ENCOUNTER — Telehealth: Payer: Self-pay | Admitting: Cardiovascular Disease

## 2020-01-18 DIAGNOSIS — Z79899 Other long term (current) drug therapy: Secondary | ICD-10-CM

## 2020-01-18 MED ORDER — LOSARTAN POTASSIUM 50 MG PO TABS: 100.0000 mg | ORAL_TABLET | Freq: Every day | ORAL | 1 refills | Status: DC

## 2020-01-18 MED ORDER — AMLODIPINE BESYLATE 5 MG PO TABS: 10.0000 mg | ORAL_TABLET | Freq: Every day | ORAL | 1 refills | Status: DC

## 2020-01-18 NOTE — Telephone Encounter (Signed)
Pt states he was discharged from the hospital last Tuesday night after admission for elevated BP. States he had one normal reading the following morning (131/72), and that all other readings have been elevated in the high 170s-180s/90s. BP this a.m. 177/93. Denies chest pain, but does feel chest pressure when BP is elevated.   Pt speculates as to why his blood pressure remains elevated. He states he separated from wife 2 weeks ago and has been experiencing emotional stress. States he was started on Abilify on 11/19 by PCP, hoping that increased relaxation would promote better blood pressure. Feels his severe back pain may be increasing his BP. States he does not tolerate opioids for pain due to mood/behavior alterations. States he manages with ibuprofen.   In reviewing the pt's next follow up appointment with our office, the pt disclosed that he is scheduled to have back surgery the same day (12/8). The pt states he is due to have "partial removal of middle spine bone with release of spinal cord and/or nerves and injection of bone cement into body of lower spine bone, assessed through the skin using imaging guidance." He believes he is the first case of the day.   Per Dr. Duke Salvia: Amlodipine increased to 10 mg by mouth daily and losartan increased to 100 mg by mouth daily. BMET in 1 week. Additionally, PharmD HTN appointment made for Tuesday, November 23 at 10:30 a.m. Pt advised to bring blood pressure cuff, all medication bottles, and log of BP readings to this appointment. Appt with Edd Fabian, NP on 12/8 left on the schedule for now pending possible need for office visit for surgical clearance. Finally, pt advised to notify back surgeon of all of this information. The patient verbalizes understanding and agreement with plan.

## 2020-01-18 NOTE — Telephone Encounter (Signed)
Pt c/o BP issue: STAT if pt c/o blurred vision, one-sided weakness or slurred speech  1. What are your last 5 BP readings?  This morning:177/93 Last night: 177/91 Lowest it has been since leaving the hospital is 160/? (not provided)  2. Are you having any other symptoms (ex. Dizziness, headache, blurred vision, passed out)?  States sometimes his heart feels sort of tight but nothing outside of that.  3. What is your BP issue?  Patient states that his blood pressure has not went down since leaving the hospital last week and Dr. Duke Salvia told him to call her if he is having any more issues. Patient was admitted on 11/15 and discharged on 11/16. Patient states he is very concerned with how high his bp is and he would like someone to help him.   Please call/advise  Thank you!

## 2020-01-19 ENCOUNTER — Other Ambulatory Visit: Payer: Self-pay

## 2020-01-19 ENCOUNTER — Ambulatory Visit (INDEPENDENT_AMBULATORY_CARE_PROVIDER_SITE_OTHER): Payer: Medicare PPO | Admitting: Pharmacist

## 2020-01-19 ENCOUNTER — Telehealth: Payer: Self-pay | Admitting: Cardiovascular Disease

## 2020-01-19 VITALS — BP 138/74 | HR 83 | Resp 14 | Ht 71.0 in | Wt 192.6 lb

## 2020-01-19 DIAGNOSIS — I10 Essential (primary) hypertension: Secondary | ICD-10-CM | POA: Diagnosis not present

## 2020-01-19 MED ORDER — LOSARTAN POTASSIUM 50 MG PO TABS
50.0000 mg | ORAL_TABLET | Freq: Two times a day (BID) | ORAL | 1 refills | Status: DC
Start: 2020-01-19 — End: 2021-12-05

## 2020-01-19 NOTE — Telephone Encounter (Signed)
Patient was very frustrated because he states his medications keep getting changed by the office and his PCP. He states the offices need to get in contact with one another to straighten everything out. He states he is very frustrated and is considering going to [facility], because he is so upset. His PCP's office number is: [phone]

## 2020-01-19 NOTE — Patient Instructions (Addendum)
Return for a  follow up appointment in AFTER SURGERY  Go to the lab in  1 week  Check your blood pressure at home daily (if able) and keep record of the readings.  Take your BP meds as follows: *CHANGE LOSARTAN 50MG  TO 1 TABLET TWICE DAILY* *CONTINUE ALL OTHER MEDICATION AS PRESCRIBED*  Bring all of your meds, your BP cuff and your record of home blood pressures to your next appointment.  Exercise as you're able, try to walk approximately 30 minutes per day.  Keep salt intake to a minimum, especially watch canned and prepared boxed foods.  Eat more fresh fruits and vegetables and fewer canned items.  Avoid eating in fast food restaurants.    HOW TO TAKE YOUR BLOOD PRESSURE: . Rest 5 minutes before taking your blood pressure. .  Don't smoke or drink caffeinated beverages for at least 30 minutes before. . Take your blood pressure before (not after) you eat. . Sit comfortably with your back supported and both feet on the floor (don't cross your legs). . Elevate your arm to heart level on a table or a desk. . Use the proper sized cuff. It should fit smoothly and snugly around your bare upper arm. There should be enough room to slip a fingertip under the cuff. The bottom edge of the cuff should be 1 inch above the crease of the elbow. . Ideally, take 3 measurements at one sitting and record the average.

## 2020-01-19 NOTE — Progress Notes (Signed)
Patient ID: Dwayne Vargas                 DOB: [birth year]                      MRN: [id number]     HPI: Dwayne Vargas is a 74 y.o. male referred by Dr. Duke Salvia to HTN clinic. Patient was discharged from the hospital last Tuesday night after admission for elevated BP. He states that besides having a BP of 131/72 the following morning, his BP has been elevated in the 170s-180s/90s. When he was discharged from the hospital, he was sent home on losartan 50 mg qd and amlodipine 5 mg qd. Dr. Duke Salvia increased these doses yesterday (see telephone note) and ordered a BMET for 1 week. Possible contributing factors to his elevated BP are emotional stress since he separated from his wife 2 weeks ago, and he has chronic pain. On a scale of 1-10, he says his pain immediately goes up to a 7 or 8 when he starts moving around for the day.   PMH:  Severe Chronic pain Had back surgery in May, and has another surgery scheduled for 02/03/20- partial removal of middle spine bone with release of spinal cord and/or nerves and injection of bone cement into body of lower spine bone, assessed through the skin using imaging guidance- on diclofenac 75 mg bid. Also has nerve pain in his right thigh, had a right hip replacement a few years ago, and has a torn rotator cuff that he gets cortisone shots for  Hyperlipidemia 01/12/20 TC 145, TG 95, HDL 47, LDL 79. Coronary CTA shows mild-moderate non-obstructive CAD- on rosuvastatin 20 mg qpm   T2DM 01/11/20 A1c 5.6- was on glipizide 5 mg qd and januvia 100 mg qd, but these were discontinued when he was discharged from the hospital  Bipolar disorder On lamotrigine 200 mg qhs and trazodone 100 mg qhs   Current HTN meds:  Amlodipine 5mg  - takes 2 tablets daily (dose changed yesterday 11/23) Losartan 50mg  - takes 2 tablets daily (dose changed yesterday 11/23)  Previously tried:  Metoprolol - stopped d/t bradycardia and ED  BP goal: <130/80  Family Hx: father had HTN, grandfather had bad  heart  Social Hx: Negative for smoking or alcohol use  Diet: Eats a lot of [employer] because he feels he can get a good variety of food. He doesn't cook at home because his back hurts too much to stand and he recently separated with his wife, so he doesn't have anyone to cook for him.  Drinks coffee every morning but recently bought decaf coffee to try.   Physical activity: used to be active, but chronic pain limits his ability to exercise  Labs: 01/12/20 Na 135, K 4.1, SCr 0.85, BUN 14. Will recheck BMET in 1 week to assess electrolytes and kidney function since increasing his losartan.  Home BP readings:  Patient has been monitoring his BP at home. His meter is around 75 years old but was checked for accuracy today. Home monitor read 142/67 mmHg and manual reading was 138/74 mmHg. He has checked his BP 8 times in the last 24 hours and values ranged from 165-186/72-95 mmHg.  Wt Readings from Last 3 Encounters:  01/19/20 192 lb 9.6 oz (87.4 kg)  01/11/20 189 lb (85.7 kg)  01/27/19 203 lb (92.1 kg)   BP Readings from Last 3 Encounters:  01/19/20 138/74  01/12/20 131/68  01/27/19 (!) 156/89   Pulse Readings  from Last 3 Encounters:  01/19/20 83  01/12/20 (!) 102  01/27/19 65    Renal function: Estimated Creatinine Clearance: 80 mL/min (by C-G formula based on SCr of 0.85 mg/dL).  Past Medical History:  Diagnosis Date  . Anxiety   . Benign localized prostatic hyperplasia with lower urinary tract symptoms (LUTS)   . Bipolar 1 disorder (HCC)   . Complication of anesthesia    limited neck motion due to cervical spine surgery  . Depression   . ED (erectile dysfunction) of organic origin   . History of penile cancer    s/p  excision of cancer 2013  . HOH (hard of hearing)    left ear  . Hypertension   . Irritable bowel    intermittant diarrhea  secondary to antibiotics  . Legally blind in left eye, as defined in [place]   . Stricture, prostate    urethra  . Type 2 diabetes mellitus  (HCC)   . Weak urinary stream    intermittant    Current Outpatient Medications on File Prior to Visit  Medication Sig Dispense Refill  . amLODipine (NORVASC) 5 MG tablet Take 2 tablets (10 mg total) by mouth daily. 180 tablet 1  . ARIPiprazole (ABILIFY) 5 MG tablet Take 5 mg by mouth daily.    . Baclofen 5 MG TABS Take 5 mg by mouth 2 (two) times daily as needed for pain.    Marland Kitchen diclofenac (VOLTAREN) 75 MG EC tablet Take 75 mg by mouth 2 (two) times daily.    . fluticasone (FLONASE) 50 MCG/ACT nasal spray Place 1-2 sprays into both nostrils daily as needed for allergies or rhinitis.    Marland Kitchen gabapentin (NEURONTIN) 100 MG capsule Take 200 mg by mouth at bedtime.     . lamoTRIgine (LAMICTAL) 200 MG tablet Take 200 mg by mouth at bedtime.  3  . Multiple Vitamin (MULTIVITAMIN WITH MINERALS) TABS tablet Take 1 tablet by mouth daily.    Marland Kitchen omeprazole (PRILOSEC) 20 MG capsule Take 20 mg by mouth daily.    . rosuvastatin (CRESTOR) 20 MG tablet Take 1 tablet (20 mg total) by mouth every evening. 90 tablet 0  . traZODone (DESYREL) 50 MG tablet Take 100 mg by mouth at bedtime.      No current facility-administered medications on file prior to visit.    Allergies  Allergen Reactions  . Aspirin Other (See Comments)    GI upset  . Contrast Media [Iodinated Diagnostic Agents] Hives and Swelling  . Fentanyl     Pt wishes not to have  . Morphine And Related Other (See Comments)    Tremors   . Oxycontin [Oxycodone Hcl] Other (See Comments)    "pt does not wish to have"  . Shellfish Allergy Hives    All fish and shellfish w/ iodine  . Diazepam Other (See Comments)    Patient does not want to use    Blood pressure 138/74, pulse 83, resp. rate 14, height 5\' 11"  (1.803 m), weight 192 lb 9.6 oz (87.4 kg), SpO2 95 %.  HTN (hypertension) BP is currently uncontrolled. Patient believes this is due to his pain being uncontrolled and needing further management for his bipolar disorder. He is going to contact  his other providers to see if they can adjust his other medications. Patient was expecting to see a change in his BP already since increasing the doses of his medications yesterday. Explained that it can take up to 2 weeks to see full effect  and he will start to slowly see a change over the next few weeks. Plan is to change how he is taking his losartan by having him take 50 mg bid (100 mg total) and continue taking amlodipine 10 mg qam. Went over proper BP technique and instructed him to only check BP twice a day as checking it more than this can cause anxiety and increased BP. Will check labs in 1 week and have him follow up after surgery.    Fanny Dance, PharmD Candidate [facility] Class of 2022  reviewed and approved by (presnets during OV): Breyanna Valera Rodriguez-Guzman PharmD, BCPS, CPP [facility] [address] [place] [address] 01/19/2020 2:12 PM

## 2020-01-19 NOTE — Assessment & Plan Note (Signed)
BP is currently uncontrolled. Patient believes this is due to his pain being uncontrolled and needing further management for his bipolar disorder. He is going to contact his other providers to see if they can adjust his other medications. Patient was expecting to see a change in his BP already since increasing the doses of his medications yesterday. Explained that it can take up to 2 weeks to see full effect and he will start to slowly see a change over the next few weeks. Plan is to change how he is taking his losartan by having him take 50 mg bid (100 mg total) and continue taking amlodipine 10 mg qam. Went over proper BP technique and instructed him to only check BP twice a day as checking it more than this can cause anxiety and increased BP. Will check labs in 1 week and have him follow up after surgery.

## 2020-01-19 NOTE — Telephone Encounter (Signed)
Returned the call to the patient. He stated that he has been hurting for a long and that this pain he is in exacerbates the bipolar. He stated that he was very upset and that he knows what he needs for medication and it is not what he keeps getting prescribed. He stated that he regrets seeing "[place]" and that "we were not competent." He stated that he needs a prescription for Demerol and has been advised to call his PCP for this concern. He then got upset and stated that his blood pressure keeps going up due to his pain therefore this office should give him Demerol. He is due to have surgery on 12/8.  He stated that he is very frustrated and went on again to say how incompetent this office was and that he should go back to [facility]. He is upset that he did not get the results of his lab work from the hospital stay and that this office is a part of [facility] therefore we should have called him with those results.   He stated that he saw Dr. Duke Salvia in the hospital and that he only wants to see her. He did see pharmD today but has not been seen by anyone as of yet.   He did apologize in the end of the conversation stating that he was just frustrated and in pain. He does want to keep Dr. Duke Salvia for his cardiologist.

## 2020-01-20 NOTE — Telephone Encounter (Signed)
Oh boy.  Sorry you had to deal with all that.  Thank  you.

## 2020-01-29 DIAGNOSIS — I10 Essential (primary) hypertension: Secondary | ICD-10-CM | POA: Diagnosis not present

## 2020-02-01 DIAGNOSIS — Z20822 Contact with and (suspected) exposure to covid-19: Secondary | ICD-10-CM | POA: Diagnosis not present

## 2020-02-01 DIAGNOSIS — G4730 Sleep apnea, unspecified: Secondary | ICD-10-CM | POA: Diagnosis not present

## 2020-02-01 DIAGNOSIS — M5000 Cervical disc disorder with myelopathy, unspecified cervical region: Secondary | ICD-10-CM | POA: Diagnosis not present

## 2020-02-01 DIAGNOSIS — E119 Type 2 diabetes mellitus without complications: Secondary | ICD-10-CM | POA: Diagnosis not present

## 2020-02-01 DIAGNOSIS — M48061 Spinal stenosis, lumbar region without neurogenic claudication: Secondary | ICD-10-CM | POA: Diagnosis not present

## 2020-02-01 DIAGNOSIS — Z01818 Encounter for other preprocedural examination: Secondary | ICD-10-CM | POA: Diagnosis not present

## 2020-02-01 DIAGNOSIS — I10 Essential (primary) hypertension: Secondary | ICD-10-CM | POA: Diagnosis not present

## 2020-02-01 DIAGNOSIS — K580 Irritable bowel syndrome with diarrhea: Secondary | ICD-10-CM | POA: Diagnosis not present

## 2020-02-01 DIAGNOSIS — R29898 Other symptoms and signs involving the musculoskeletal system: Secondary | ICD-10-CM | POA: Diagnosis not present

## 2020-02-02 DIAGNOSIS — F3131 Bipolar disorder, current episode depressed, mild: Secondary | ICD-10-CM | POA: Diagnosis not present

## 2020-02-03 ENCOUNTER — Ambulatory Visit: Payer: Medicare PPO | Admitting: General Practice

## 2020-02-03 DIAGNOSIS — E119 Type 2 diabetes mellitus without complications: Secondary | ICD-10-CM | POA: Diagnosis not present

## 2020-02-03 DIAGNOSIS — E785 Hyperlipidemia, unspecified: Secondary | ICD-10-CM | POA: Diagnosis not present

## 2020-02-03 DIAGNOSIS — M48062 Spinal stenosis, lumbar region with neurogenic claudication: Secondary | ICD-10-CM | POA: Diagnosis not present

## 2020-02-03 DIAGNOSIS — M5441 Lumbago with sciatica, right side: Secondary | ICD-10-CM | POA: Diagnosis not present

## 2020-02-03 DIAGNOSIS — I10 Essential (primary) hypertension: Secondary | ICD-10-CM | POA: Diagnosis not present

## 2020-02-03 DIAGNOSIS — T84296A Other mechanical complication of internal fixation device of vertebrae, initial encounter: Secondary | ICD-10-CM | POA: Diagnosis not present

## 2020-02-03 DIAGNOSIS — T84226A Displacement of internal fixation device of vertebrae, initial encounter: Secondary | ICD-10-CM | POA: Diagnosis not present

## 2020-02-03 DIAGNOSIS — G8929 Other chronic pain: Secondary | ICD-10-CM | POA: Diagnosis not present

## 2020-02-03 DIAGNOSIS — M5416 Radiculopathy, lumbar region: Secondary | ICD-10-CM | POA: Diagnosis not present

## 2020-02-03 DIAGNOSIS — H548 Legal blindness, as defined in USA: Secondary | ICD-10-CM | POA: Diagnosis not present

## 2020-02-03 DIAGNOSIS — S32059K Unspecified fracture of fifth lumbar vertebra, subsequent encounter for fracture with nonunion: Secondary | ICD-10-CM | POA: Diagnosis not present

## 2020-02-03 DIAGNOSIS — M960 Pseudarthrosis after fusion or arthrodesis: Secondary | ICD-10-CM | POA: Diagnosis not present

## 2020-02-03 DIAGNOSIS — M2578 Osteophyte, vertebrae: Secondary | ICD-10-CM | POA: Diagnosis not present

## 2020-02-03 DIAGNOSIS — S3210XK Unspecified fracture of sacrum, subsequent encounter for fracture with nonunion: Secondary | ICD-10-CM | POA: Diagnosis not present

## 2020-02-04 DIAGNOSIS — G8929 Other chronic pain: Secondary | ICD-10-CM | POA: Diagnosis not present

## 2020-02-04 DIAGNOSIS — H548 Legal blindness, as defined in USA: Secondary | ICD-10-CM | POA: Diagnosis not present

## 2020-02-04 DIAGNOSIS — I10 Essential (primary) hypertension: Secondary | ICD-10-CM | POA: Diagnosis not present

## 2020-02-04 DIAGNOSIS — E119 Type 2 diabetes mellitus without complications: Secondary | ICD-10-CM | POA: Diagnosis not present

## 2020-02-04 DIAGNOSIS — M5416 Radiculopathy, lumbar region: Secondary | ICD-10-CM | POA: Diagnosis not present

## 2020-02-04 DIAGNOSIS — M48062 Spinal stenosis, lumbar region with neurogenic claudication: Secondary | ICD-10-CM | POA: Diagnosis not present

## 2020-02-04 DIAGNOSIS — S3210XK Unspecified fracture of sacrum, subsequent encounter for fracture with nonunion: Secondary | ICD-10-CM | POA: Diagnosis not present

## 2020-02-04 DIAGNOSIS — S32059K Unspecified fracture of fifth lumbar vertebra, subsequent encounter for fracture with nonunion: Secondary | ICD-10-CM | POA: Diagnosis not present

## 2020-02-04 DIAGNOSIS — T84226A Displacement of internal fixation device of vertebrae, initial encounter: Secondary | ICD-10-CM | POA: Diagnosis not present

## 2020-02-05 DIAGNOSIS — T84226A Displacement of internal fixation device of vertebrae, initial encounter: Secondary | ICD-10-CM | POA: Diagnosis not present

## 2020-02-05 DIAGNOSIS — Z981 Arthrodesis status: Secondary | ICD-10-CM | POA: Diagnosis not present

## 2020-02-05 DIAGNOSIS — S3210XK Unspecified fracture of sacrum, subsequent encounter for fracture with nonunion: Secondary | ICD-10-CM | POA: Diagnosis not present

## 2020-02-05 DIAGNOSIS — H548 Legal blindness, as defined in USA: Secondary | ICD-10-CM | POA: Diagnosis not present

## 2020-02-05 DIAGNOSIS — M48062 Spinal stenosis, lumbar region with neurogenic claudication: Secondary | ICD-10-CM | POA: Diagnosis not present

## 2020-02-05 DIAGNOSIS — I10 Essential (primary) hypertension: Secondary | ICD-10-CM | POA: Diagnosis not present

## 2020-02-05 DIAGNOSIS — M5416 Radiculopathy, lumbar region: Secondary | ICD-10-CM | POA: Diagnosis not present

## 2020-02-05 DIAGNOSIS — S32059K Unspecified fracture of fifth lumbar vertebra, subsequent encounter for fracture with nonunion: Secondary | ICD-10-CM | POA: Diagnosis not present

## 2020-02-05 DIAGNOSIS — G8929 Other chronic pain: Secondary | ICD-10-CM | POA: Diagnosis not present

## 2020-02-05 DIAGNOSIS — E119 Type 2 diabetes mellitus without complications: Secondary | ICD-10-CM | POA: Diagnosis not present

## 2020-02-06 DIAGNOSIS — T84226A Displacement of internal fixation device of vertebrae, initial encounter: Secondary | ICD-10-CM | POA: Diagnosis not present

## 2020-02-06 DIAGNOSIS — G8929 Other chronic pain: Secondary | ICD-10-CM | POA: Diagnosis not present

## 2020-02-06 DIAGNOSIS — I10 Essential (primary) hypertension: Secondary | ICD-10-CM | POA: Diagnosis not present

## 2020-02-06 DIAGNOSIS — M5416 Radiculopathy, lumbar region: Secondary | ICD-10-CM | POA: Diagnosis not present

## 2020-02-06 DIAGNOSIS — E119 Type 2 diabetes mellitus without complications: Secondary | ICD-10-CM | POA: Diagnosis not present

## 2020-02-06 DIAGNOSIS — M48062 Spinal stenosis, lumbar region with neurogenic claudication: Secondary | ICD-10-CM | POA: Diagnosis not present

## 2020-02-06 DIAGNOSIS — H548 Legal blindness, as defined in USA: Secondary | ICD-10-CM | POA: Diagnosis not present

## 2020-02-06 DIAGNOSIS — S3210XK Unspecified fracture of sacrum, subsequent encounter for fracture with nonunion: Secondary | ICD-10-CM | POA: Diagnosis not present

## 2020-02-06 DIAGNOSIS — S32059K Unspecified fracture of fifth lumbar vertebra, subsequent encounter for fracture with nonunion: Secondary | ICD-10-CM | POA: Diagnosis not present

## 2020-02-07 DIAGNOSIS — E119 Type 2 diabetes mellitus without complications: Secondary | ICD-10-CM | POA: Diagnosis not present

## 2020-02-07 DIAGNOSIS — S32059K Unspecified fracture of fifth lumbar vertebra, subsequent encounter for fracture with nonunion: Secondary | ICD-10-CM | POA: Diagnosis not present

## 2020-02-07 DIAGNOSIS — H548 Legal blindness, as defined in USA: Secondary | ICD-10-CM | POA: Diagnosis not present

## 2020-02-07 DIAGNOSIS — I10 Essential (primary) hypertension: Secondary | ICD-10-CM | POA: Diagnosis not present

## 2020-02-07 DIAGNOSIS — T84226A Displacement of internal fixation device of vertebrae, initial encounter: Secondary | ICD-10-CM | POA: Diagnosis not present

## 2020-02-07 DIAGNOSIS — M48062 Spinal stenosis, lumbar region with neurogenic claudication: Secondary | ICD-10-CM | POA: Diagnosis not present

## 2020-02-07 DIAGNOSIS — M5416 Radiculopathy, lumbar region: Secondary | ICD-10-CM | POA: Diagnosis not present

## 2020-02-07 DIAGNOSIS — G8929 Other chronic pain: Secondary | ICD-10-CM | POA: Diagnosis not present

## 2020-02-07 DIAGNOSIS — S3210XK Unspecified fracture of sacrum, subsequent encounter for fracture with nonunion: Secondary | ICD-10-CM | POA: Diagnosis not present

## 2020-02-08 DIAGNOSIS — E782 Mixed hyperlipidemia: Secondary | ICD-10-CM | POA: Diagnosis not present

## 2020-02-08 DIAGNOSIS — M5416 Radiculopathy, lumbar region: Secondary | ICD-10-CM | POA: Diagnosis not present

## 2020-02-08 DIAGNOSIS — M48062 Spinal stenosis, lumbar region with neurogenic claudication: Secondary | ICD-10-CM | POA: Diagnosis not present

## 2020-02-08 DIAGNOSIS — M6281 Muscle weakness (generalized): Secondary | ICD-10-CM | POA: Diagnosis not present

## 2020-02-08 DIAGNOSIS — F319 Bipolar disorder, unspecified: Secondary | ICD-10-CM | POA: Diagnosis not present

## 2020-02-08 DIAGNOSIS — R2681 Unsteadiness on feet: Secondary | ICD-10-CM | POA: Diagnosis not present

## 2020-02-08 DIAGNOSIS — I10 Essential (primary) hypertension: Secondary | ICD-10-CM | POA: Diagnosis not present

## 2020-02-08 DIAGNOSIS — R41841 Cognitive communication deficit: Secondary | ICD-10-CM | POA: Diagnosis not present

## 2020-02-08 DIAGNOSIS — M48061 Spinal stenosis, lumbar region without neurogenic claudication: Secondary | ICD-10-CM | POA: Diagnosis not present

## 2020-02-08 DIAGNOSIS — N401 Enlarged prostate with lower urinary tract symptoms: Secondary | ICD-10-CM | POA: Diagnosis not present

## 2020-02-08 DIAGNOSIS — G4733 Obstructive sleep apnea (adult) (pediatric): Secondary | ICD-10-CM | POA: Diagnosis not present

## 2020-02-08 DIAGNOSIS — R2689 Other abnormalities of gait and mobility: Secondary | ICD-10-CM | POA: Diagnosis not present

## 2020-02-08 DIAGNOSIS — E119 Type 2 diabetes mellitus without complications: Secondary | ICD-10-CM | POA: Diagnosis not present

## 2020-02-08 DIAGNOSIS — E118 Type 2 diabetes mellitus with unspecified complications: Secondary | ICD-10-CM | POA: Diagnosis not present

## 2020-02-08 DIAGNOSIS — G2581 Restless legs syndrome: Secondary | ICD-10-CM | POA: Diagnosis not present

## 2020-02-09 DIAGNOSIS — G4733 Obstructive sleep apnea (adult) (pediatric): Secondary | ICD-10-CM | POA: Diagnosis not present

## 2020-02-09 DIAGNOSIS — M5416 Radiculopathy, lumbar region: Secondary | ICD-10-CM | POA: Diagnosis not present

## 2020-02-09 DIAGNOSIS — M48061 Spinal stenosis, lumbar region without neurogenic claudication: Secondary | ICD-10-CM | POA: Diagnosis not present

## 2020-02-09 DIAGNOSIS — R2681 Unsteadiness on feet: Secondary | ICD-10-CM | POA: Diagnosis not present

## 2020-02-09 DIAGNOSIS — N401 Enlarged prostate with lower urinary tract symptoms: Secondary | ICD-10-CM | POA: Diagnosis not present

## 2020-02-09 DIAGNOSIS — E118 Type 2 diabetes mellitus with unspecified complications: Secondary | ICD-10-CM | POA: Diagnosis not present

## 2020-02-09 DIAGNOSIS — E782 Mixed hyperlipidemia: Secondary | ICD-10-CM | POA: Diagnosis not present

## 2020-02-09 DIAGNOSIS — F319 Bipolar disorder, unspecified: Secondary | ICD-10-CM | POA: Diagnosis not present

## 2020-02-09 DIAGNOSIS — M6281 Muscle weakness (generalized): Secondary | ICD-10-CM | POA: Diagnosis not present

## 2020-02-09 DIAGNOSIS — G2581 Restless legs syndrome: Secondary | ICD-10-CM | POA: Diagnosis not present

## 2020-02-09 DIAGNOSIS — I10 Essential (primary) hypertension: Secondary | ICD-10-CM | POA: Diagnosis not present

## 2020-02-09 DIAGNOSIS — E119 Type 2 diabetes mellitus without complications: Secondary | ICD-10-CM | POA: Diagnosis not present

## 2020-02-11 DIAGNOSIS — R2681 Unsteadiness on feet: Secondary | ICD-10-CM | POA: Diagnosis not present

## 2020-02-11 DIAGNOSIS — E119 Type 2 diabetes mellitus without complications: Secondary | ICD-10-CM | POA: Diagnosis not present

## 2020-02-11 DIAGNOSIS — F319 Bipolar disorder, unspecified: Secondary | ICD-10-CM | POA: Diagnosis not present

## 2020-02-11 DIAGNOSIS — E782 Mixed hyperlipidemia: Secondary | ICD-10-CM | POA: Diagnosis not present

## 2020-02-11 DIAGNOSIS — E118 Type 2 diabetes mellitus with unspecified complications: Secondary | ICD-10-CM | POA: Diagnosis not present

## 2020-02-11 DIAGNOSIS — M5416 Radiculopathy, lumbar region: Secondary | ICD-10-CM | POA: Diagnosis not present

## 2020-02-11 DIAGNOSIS — N401 Enlarged prostate with lower urinary tract symptoms: Secondary | ICD-10-CM | POA: Diagnosis not present

## 2020-02-11 DIAGNOSIS — M6281 Muscle weakness (generalized): Secondary | ICD-10-CM | POA: Diagnosis not present

## 2020-02-11 DIAGNOSIS — G2581 Restless legs syndrome: Secondary | ICD-10-CM | POA: Diagnosis not present

## 2020-02-11 DIAGNOSIS — M48061 Spinal stenosis, lumbar region without neurogenic claudication: Secondary | ICD-10-CM | POA: Diagnosis not present

## 2020-02-11 DIAGNOSIS — G4733 Obstructive sleep apnea (adult) (pediatric): Secondary | ICD-10-CM | POA: Diagnosis not present

## 2020-02-12 DIAGNOSIS — E119 Type 2 diabetes mellitus without complications: Secondary | ICD-10-CM | POA: Diagnosis not present

## 2020-02-12 DIAGNOSIS — M5416 Radiculopathy, lumbar region: Secondary | ICD-10-CM | POA: Diagnosis not present

## 2020-02-12 DIAGNOSIS — F319 Bipolar disorder, unspecified: Secondary | ICD-10-CM | POA: Diagnosis not present

## 2020-02-12 DIAGNOSIS — M48061 Spinal stenosis, lumbar region without neurogenic claudication: Secondary | ICD-10-CM | POA: Diagnosis not present

## 2020-02-12 DIAGNOSIS — N401 Enlarged prostate with lower urinary tract symptoms: Secondary | ICD-10-CM | POA: Diagnosis not present

## 2020-02-12 DIAGNOSIS — E782 Mixed hyperlipidemia: Secondary | ICD-10-CM | POA: Diagnosis not present

## 2020-02-12 DIAGNOSIS — R2681 Unsteadiness on feet: Secondary | ICD-10-CM | POA: Diagnosis not present

## 2020-02-12 DIAGNOSIS — G2581 Restless legs syndrome: Secondary | ICD-10-CM | POA: Diagnosis not present

## 2020-02-12 DIAGNOSIS — I10 Essential (primary) hypertension: Secondary | ICD-10-CM | POA: Diagnosis not present

## 2020-02-12 DIAGNOSIS — M6281 Muscle weakness (generalized): Secondary | ICD-10-CM | POA: Diagnosis not present

## 2020-02-12 DIAGNOSIS — G4733 Obstructive sleep apnea (adult) (pediatric): Secondary | ICD-10-CM | POA: Diagnosis not present

## 2020-02-15 DIAGNOSIS — T84296D Other mechanical complication of internal fixation device of vertebrae, subsequent encounter: Secondary | ICD-10-CM | POA: Diagnosis not present

## 2020-02-15 DIAGNOSIS — M479 Spondylosis, unspecified: Secondary | ICD-10-CM | POA: Diagnosis not present

## 2020-02-15 DIAGNOSIS — M5416 Radiculopathy, lumbar region: Secondary | ICD-10-CM | POA: Diagnosis not present

## 2020-02-15 DIAGNOSIS — F319 Bipolar disorder, unspecified: Secondary | ICD-10-CM | POA: Diagnosis not present

## 2020-02-15 DIAGNOSIS — M48062 Spinal stenosis, lumbar region with neurogenic claudication: Secondary | ICD-10-CM | POA: Diagnosis not present

## 2020-02-15 DIAGNOSIS — E785 Hyperlipidemia, unspecified: Secondary | ICD-10-CM | POA: Diagnosis not present

## 2020-02-15 DIAGNOSIS — M84472D Pathological fracture, left ankle, subsequent encounter for fracture with routine healing: Secondary | ICD-10-CM | POA: Diagnosis not present

## 2020-02-15 DIAGNOSIS — E119 Type 2 diabetes mellitus without complications: Secondary | ICD-10-CM | POA: Diagnosis not present

## 2020-02-15 DIAGNOSIS — F419 Anxiety disorder, unspecified: Secondary | ICD-10-CM | POA: Diagnosis not present

## 2020-02-18 DIAGNOSIS — E785 Hyperlipidemia, unspecified: Secondary | ICD-10-CM | POA: Diagnosis not present

## 2020-02-18 DIAGNOSIS — F419 Anxiety disorder, unspecified: Secondary | ICD-10-CM | POA: Diagnosis not present

## 2020-02-18 DIAGNOSIS — F319 Bipolar disorder, unspecified: Secondary | ICD-10-CM | POA: Diagnosis not present

## 2020-02-18 DIAGNOSIS — M48062 Spinal stenosis, lumbar region with neurogenic claudication: Secondary | ICD-10-CM | POA: Diagnosis not present

## 2020-02-18 DIAGNOSIS — E119 Type 2 diabetes mellitus without complications: Secondary | ICD-10-CM | POA: Diagnosis not present

## 2020-02-18 DIAGNOSIS — M84472D Pathological fracture, left ankle, subsequent encounter for fracture with routine healing: Secondary | ICD-10-CM | POA: Diagnosis not present

## 2020-02-18 DIAGNOSIS — M479 Spondylosis, unspecified: Secondary | ICD-10-CM | POA: Diagnosis not present

## 2020-02-18 DIAGNOSIS — T84296D Other mechanical complication of internal fixation device of vertebrae, subsequent encounter: Secondary | ICD-10-CM | POA: Diagnosis not present

## 2020-02-18 DIAGNOSIS — M5416 Radiculopathy, lumbar region: Secondary | ICD-10-CM | POA: Diagnosis not present

## 2020-02-25 DIAGNOSIS — F3131 Bipolar disorder, current episode depressed, mild: Secondary | ICD-10-CM | POA: Diagnosis not present

## 2020-03-01 DIAGNOSIS — G479 Sleep disorder, unspecified: Secondary | ICD-10-CM | POA: Diagnosis not present

## 2020-03-01 DIAGNOSIS — E785 Hyperlipidemia, unspecified: Secondary | ICD-10-CM | POA: Diagnosis not present

## 2020-03-01 DIAGNOSIS — Z79899 Other long term (current) drug therapy: Secondary | ICD-10-CM | POA: Diagnosis not present

## 2020-03-21 DIAGNOSIS — F3131 Bipolar disorder, current episode depressed, mild: Secondary | ICD-10-CM | POA: Diagnosis not present

## 2020-04-05 DIAGNOSIS — Z981 Arthrodesis status: Secondary | ICD-10-CM | POA: Diagnosis not present

## 2020-04-05 DIAGNOSIS — M4306 Spondylolysis, lumbar region: Secondary | ICD-10-CM | POA: Diagnosis not present

## 2020-04-05 DIAGNOSIS — Z87891 Personal history of nicotine dependence: Secondary | ICD-10-CM | POA: Diagnosis not present

## 2020-04-05 DIAGNOSIS — M5136 Other intervertebral disc degeneration, lumbar region: Secondary | ICD-10-CM | POA: Diagnosis not present

## 2020-04-05 DIAGNOSIS — S32009K Unspecified fracture of unspecified lumbar vertebra, subsequent encounter for fracture with nonunion: Secondary | ICD-10-CM | POA: Diagnosis not present

## 2020-04-05 DIAGNOSIS — I10 Essential (primary) hypertension: Secondary | ICD-10-CM | POA: Diagnosis not present

## 2020-04-05 DIAGNOSIS — M545 Low back pain, unspecified: Secondary | ICD-10-CM | POA: Diagnosis not present

## 2020-04-06 DIAGNOSIS — M7541 Impingement syndrome of right shoulder: Secondary | ICD-10-CM | POA: Diagnosis not present

## 2020-04-18 DIAGNOSIS — I10 Essential (primary) hypertension: Secondary | ICD-10-CM | POA: Diagnosis not present

## 2020-04-18 DIAGNOSIS — E210 Primary hyperparathyroidism: Secondary | ICD-10-CM | POA: Diagnosis not present

## 2020-05-14 IMAGING — MR MR BRAIN/IAC WO/W CM
12 of 13 series · 41 of 48 positions shown · IV contrast (multihance)
Comparison: None.

CLINICAL DATA: Sensorineural hearing loss of left ear

EXAM:
MRI HEAD WITHOUT AND WITH CONTRAST
TECHNIQUE: Multiplanar, multiecho pulse sequences of the brain and surrounding
structures were obtained without and with intravenous contrast.
CONTRAST:  19mL MULTIHANCE GADOBENATE DIMEGLUMINE 529 MG/ML IV SOLN

[Series 2: T1 · sagittal · 5.0mm · 0.45mm/px · 3 of 21 slices shown (1 of 3)]
[im 1/21]
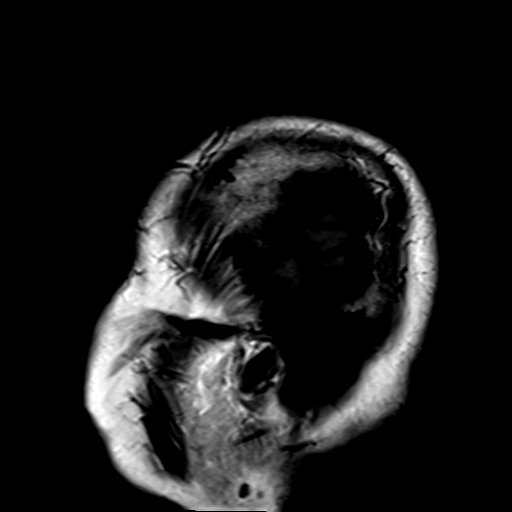
[im 11/21]
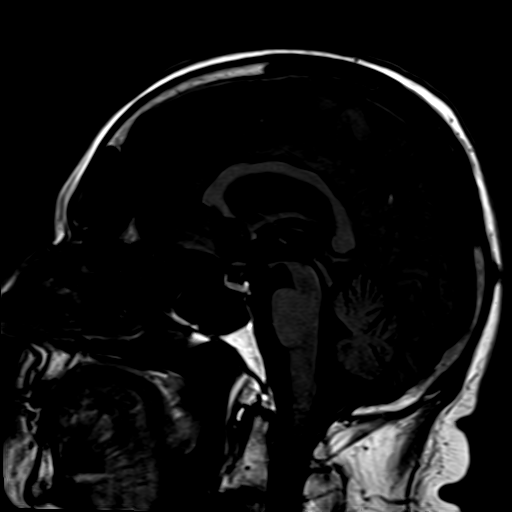
[im 21/21]
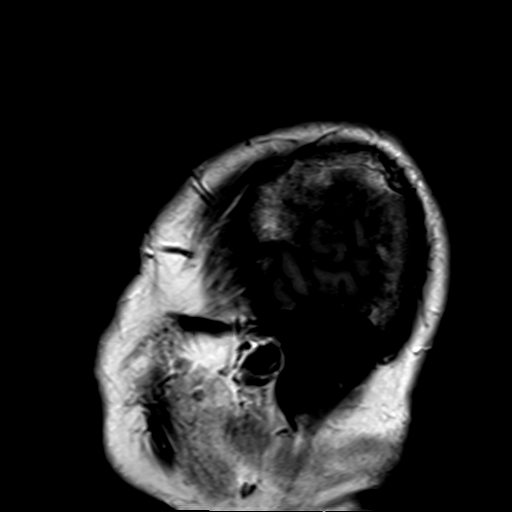

[Series 3: DWI · axial · 3.0mm · 1.80mm/px · z∈[-68,+88]mm · 11 of 106 slices shown (1 of 2)]
[im 1/106]
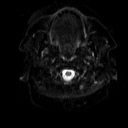
[im 11/106]
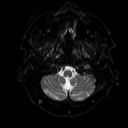
[im 22/106]
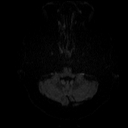
[im 32/106]
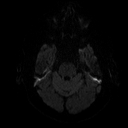
[im 43/106]
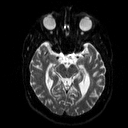
[im 53/106]
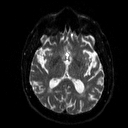
[im 64/106]
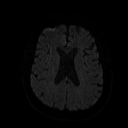
[im 74/106]
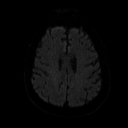
[im 85/106]
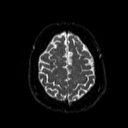
[im 95/106]
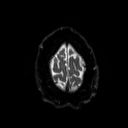
[im 106/106]
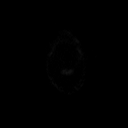

[Series 4: DWI · axial · 3.0mm · 1.80mm/px · z∈[-68,+88]mm · 5 of 53 slices shown (2 of 2)]
[im 1/53]
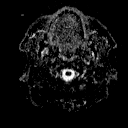
[im 14/53]
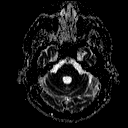
[im 27/53]
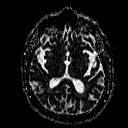
[im 40/53]
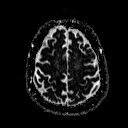
[im 53/53]
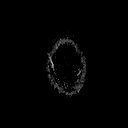

[Series 5: T2 · axial · 5.0mm · 0.51mm/px · z∈[-68,+88]mm · 2 of 25 slices shown]
[im 1/25]
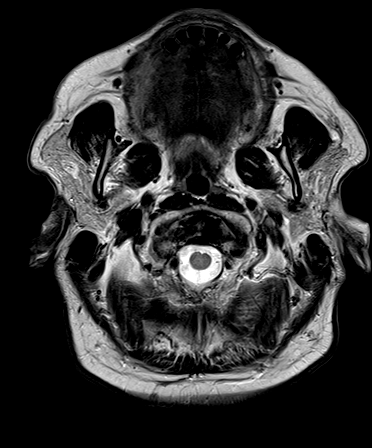
[im 25/25]
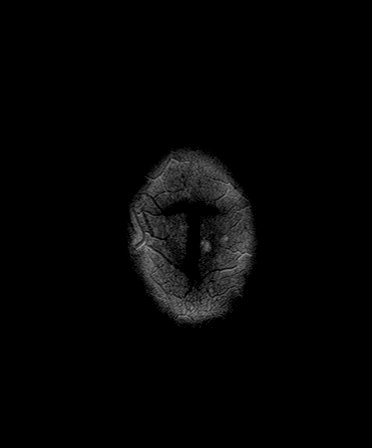

[Series 6: FLAIR · axial · 3.0mm · 0.45mm/px · z∈[-68,+88]mm · 3 of 27 slices shown]
[im 1/27]
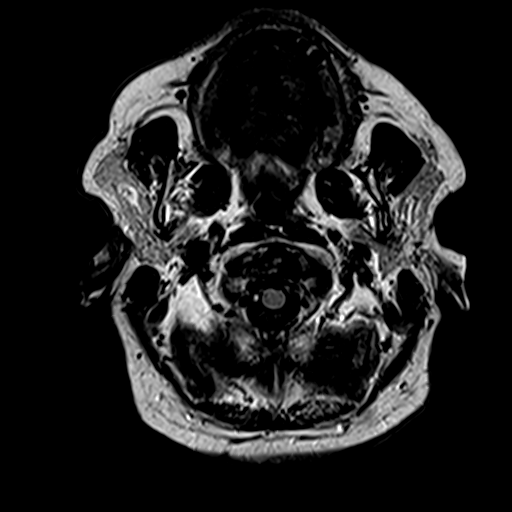
[im 14/27]
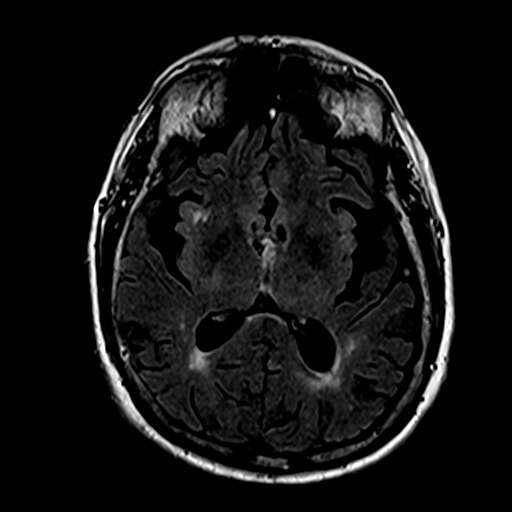
[im 27/27]
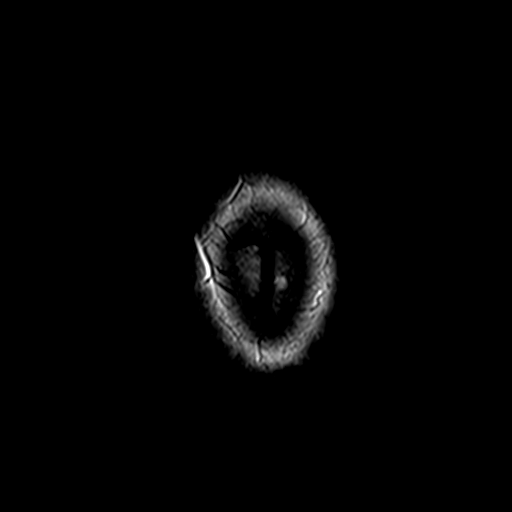

[Series 7: mip_images(sw) · axial · 24.0mm · 0.90mm/px · z∈[-56,+76]mm · 4 of 45 slices shown]
[im 1/45]
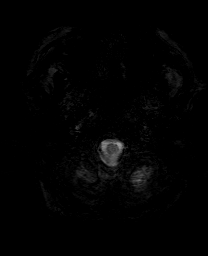
[im 15/45]
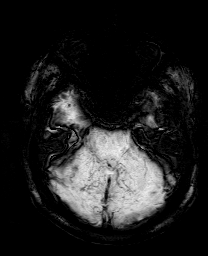
[im 30/45]
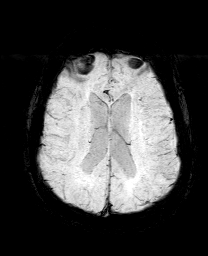
[im 45/45]
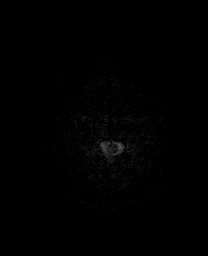

[Series 8: swi_images · axial · 3.0mm · 0.90mm/px · z∈[-66,+86]mm · 5 of 52 slices shown]
[im 1/52]
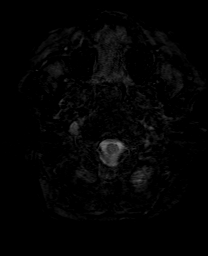
[im 13/52]
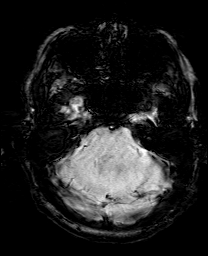
[im 26/52]
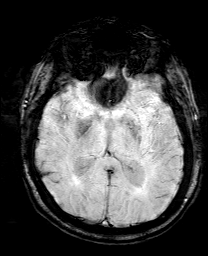
[im 39/52]
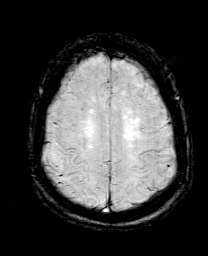
[im 52/52]
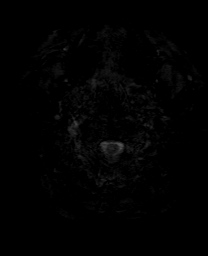

[Series 9: T1 · coronal · 3.0mm · 0.35mm/px · 1 of 11 slices shown (2 of 3)]
[im 1/11]
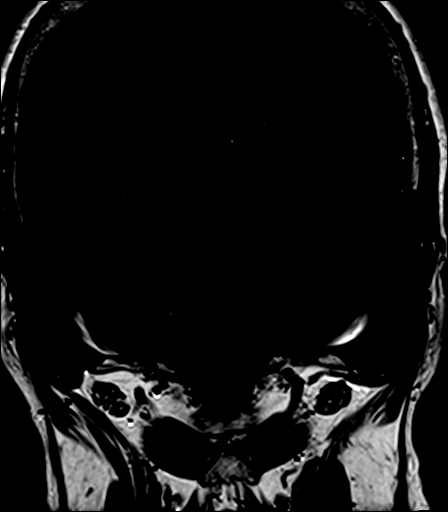

[Series 10: T1 · axial · 3.0mm · 0.35mm/px · 1 of 11 slices shown (3 of 3)]
[im 1/11]
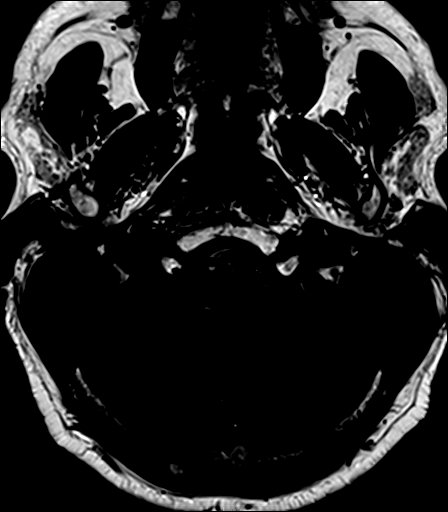

[Series 11: bSSFP · axial · 1.0mm · 0.28mm/px · z∈[-62,-27]mm · 4 of 36 slices shown]
[im 1/36]
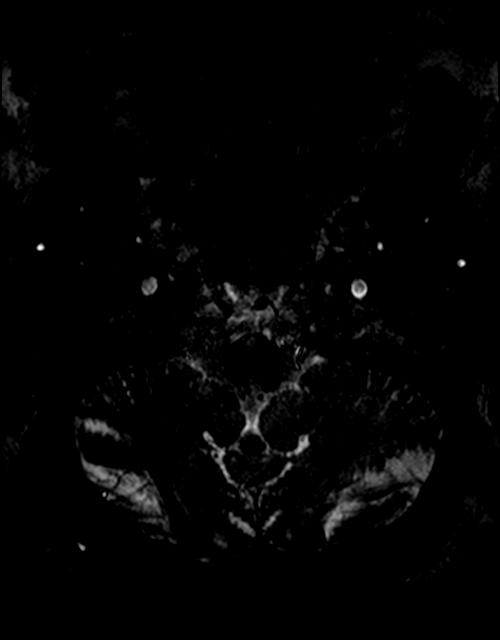
[im 12/36]
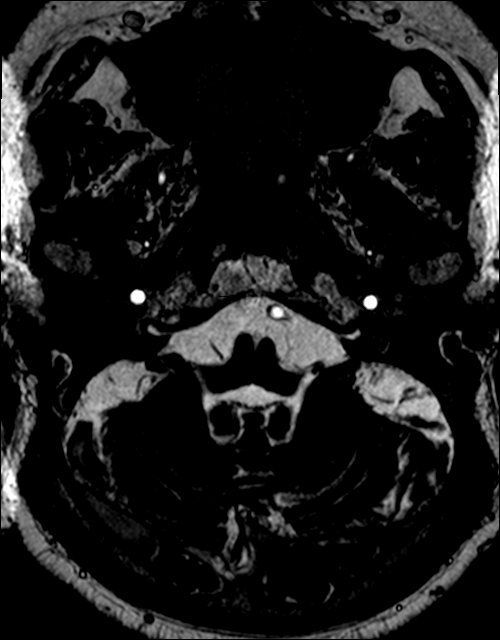
[im 24/36]
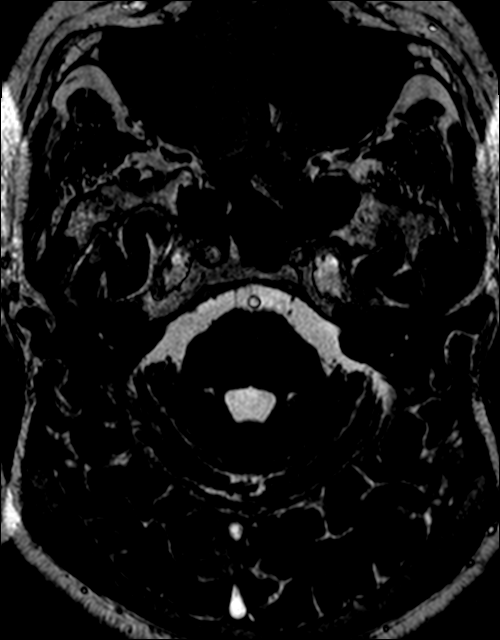
[im 36/36]
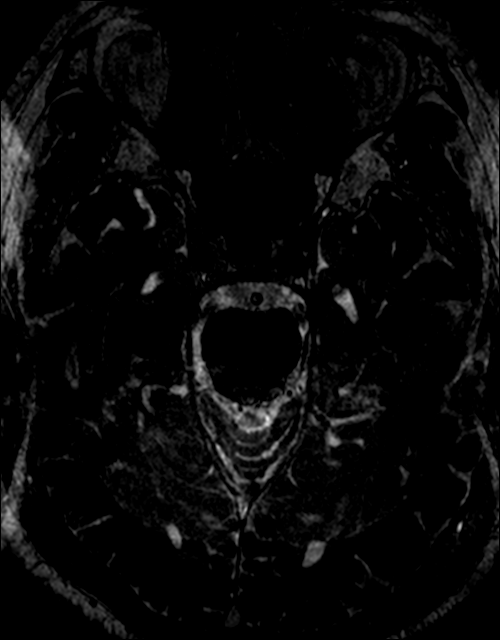

[Series 12: T1 post-contrast · coronal · 3.0mm · 0.35mm/px · 1 of 11 slices shown (1 of 2)]
[im 1/11]
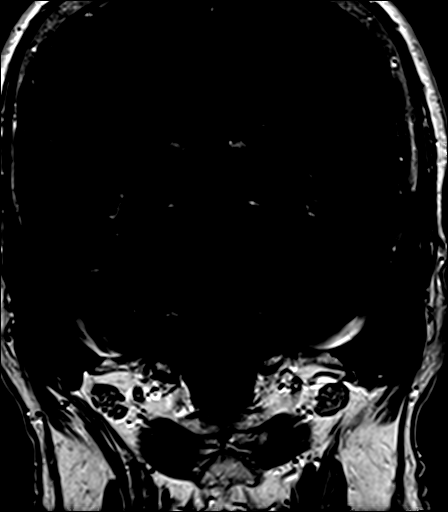

[Series 13: T1 post-contrast · axial · 3.0mm · 0.35mm/px · 1 of 11 slices shown (2 of 2)]
[im 1/11]
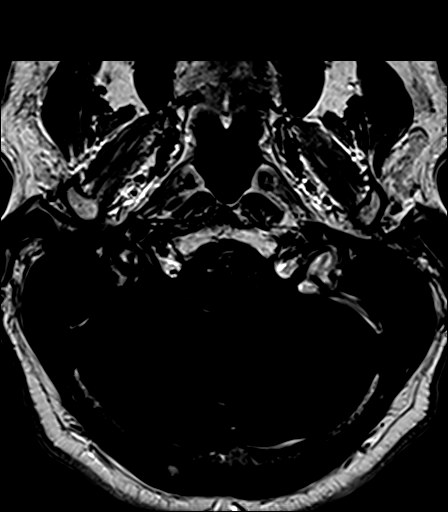

[41 of 48 positions shown; findings below may reference images not displayed]

FINDINGS: Brain: There is no cerebellopontine angle mass. Inner ear structures
demonstrate an unremarkable MR appearance. There is no abnormal
enhancement within the internal auditory canals.

No acute infarction or intracranial hemorrhage. Patchy and confluent
areas of T2 hyperintensity in the supratentorial white matter are
nonspecific but may reflect moderate chronic microvascular ischemic
changes. There is no mass effect or edema. There is no extra-axial
fluid collection.

Vascular: Major vessel flow voids at the skull base are preserved.

Skull and upper cervical spine: Normal marrow signal is preserved.

Sinuses/Orbits: Minor mucosal thickening. Bilateral lens
replacements.

Other: The sella is unremarkable.  Mastoid air cells are clear.
IMPRESSION: No mass or abnormal enhancement.

Moderate chronic microvascular ischemic changes.

## 2020-05-24 DIAGNOSIS — M4316 Spondylolisthesis, lumbar region: Secondary | ICD-10-CM | POA: Diagnosis not present

## 2020-05-24 DIAGNOSIS — G622 Polyneuropathy due to other toxic agents: Secondary | ICD-10-CM | POA: Diagnosis not present

## 2020-05-24 DIAGNOSIS — G619 Inflammatory polyneuropathy, unspecified: Secondary | ICD-10-CM | POA: Diagnosis not present

## 2020-05-24 DIAGNOSIS — M4326 Fusion of spine, lumbar region: Secondary | ICD-10-CM | POA: Diagnosis not present

## 2020-05-24 DIAGNOSIS — M4306 Spondylolysis, lumbar region: Secondary | ICD-10-CM | POA: Diagnosis not present

## 2020-05-24 DIAGNOSIS — S32009K Unspecified fracture of unspecified lumbar vertebra, subsequent encounter for fracture with nonunion: Secondary | ICD-10-CM | POA: Diagnosis not present

## 2020-05-24 DIAGNOSIS — M5136 Other intervertebral disc degeneration, lumbar region: Secondary | ICD-10-CM | POA: Diagnosis not present

## 2020-05-24 DIAGNOSIS — Z87891 Personal history of nicotine dependence: Secondary | ICD-10-CM | POA: Diagnosis not present

## 2020-06-02 DIAGNOSIS — E119 Type 2 diabetes mellitus without complications: Secondary | ICD-10-CM | POA: Diagnosis not present

## 2020-06-02 DIAGNOSIS — G4700 Insomnia, unspecified: Secondary | ICD-10-CM | POA: Diagnosis not present

## 2020-06-02 DIAGNOSIS — K5909 Other constipation: Secondary | ICD-10-CM | POA: Diagnosis not present

## 2020-06-02 DIAGNOSIS — H539 Unspecified visual disturbance: Secondary | ICD-10-CM | POA: Diagnosis not present

## 2020-06-02 DIAGNOSIS — E210 Primary hyperparathyroidism: Secondary | ICD-10-CM | POA: Diagnosis not present

## 2020-06-13 DIAGNOSIS — N5201 Erectile dysfunction due to arterial insufficiency: Secondary | ICD-10-CM | POA: Diagnosis not present

## 2020-07-07 DIAGNOSIS — M7541 Impingement syndrome of right shoulder: Secondary | ICD-10-CM | POA: Diagnosis not present

## 2020-07-13 DIAGNOSIS — H43811 Vitreous degeneration, right eye: Secondary | ICD-10-CM | POA: Diagnosis not present

## 2020-07-13 DIAGNOSIS — H35363 Drusen (degenerative) of macula, bilateral: Secondary | ICD-10-CM | POA: Diagnosis not present

## 2020-07-13 DIAGNOSIS — E1139 Type 2 diabetes mellitus with other diabetic ophthalmic complication: Secondary | ICD-10-CM | POA: Diagnosis not present

## 2020-07-13 DIAGNOSIS — H53032 Strabismic amblyopia, left eye: Secondary | ICD-10-CM | POA: Diagnosis not present

## 2020-07-23 DIAGNOSIS — F319 Bipolar disorder, unspecified: Secondary | ICD-10-CM | POA: Diagnosis not present

## 2020-07-23 DIAGNOSIS — Z885 Allergy status to narcotic agent status: Secondary | ICD-10-CM | POA: Diagnosis not present

## 2020-07-23 DIAGNOSIS — L259 Unspecified contact dermatitis, unspecified cause: Secondary | ICD-10-CM | POA: Diagnosis not present

## 2020-07-23 DIAGNOSIS — I10 Essential (primary) hypertension: Secondary | ICD-10-CM | POA: Diagnosis not present

## 2020-07-23 DIAGNOSIS — E119 Type 2 diabetes mellitus without complications: Secondary | ICD-10-CM | POA: Diagnosis not present

## 2020-08-03 DIAGNOSIS — L299 Pruritus, unspecified: Secondary | ICD-10-CM | POA: Diagnosis not present

## 2020-08-03 DIAGNOSIS — L309 Dermatitis, unspecified: Secondary | ICD-10-CM | POA: Diagnosis not present

## 2020-08-08 DIAGNOSIS — L299 Pruritus, unspecified: Secondary | ICD-10-CM | POA: Diagnosis not present

## 2020-08-08 DIAGNOSIS — L309 Dermatitis, unspecified: Secondary | ICD-10-CM | POA: Diagnosis not present

## 2020-09-20 DIAGNOSIS — F319 Bipolar disorder, unspecified: Secondary | ICD-10-CM | POA: Diagnosis not present

## 2020-09-20 DIAGNOSIS — G4709 Other insomnia: Secondary | ICD-10-CM | POA: Diagnosis not present

## 2020-09-20 DIAGNOSIS — K5900 Constipation, unspecified: Secondary | ICD-10-CM | POA: Diagnosis not present

## 2020-09-20 DIAGNOSIS — M75101 Unspecified rotator cuff tear or rupture of right shoulder, not specified as traumatic: Secondary | ICD-10-CM | POA: Diagnosis not present

## 2020-09-20 DIAGNOSIS — I10 Essential (primary) hypertension: Secondary | ICD-10-CM | POA: Diagnosis not present

## 2020-09-20 DIAGNOSIS — E119 Type 2 diabetes mellitus without complications: Secondary | ICD-10-CM | POA: Diagnosis not present

## 2021-02-04 IMAGING — CR DG CHEST 2V
2 series · 2 of 2 positions shown · non-contrast
Comparison: Prior chest radiographs 05/06/2018 and earlier.

CLINICAL DATA: Provided history: Intermittent chest pain for 1
week.

EXAM:
CHEST - 2 VIEW

[w chest pa]
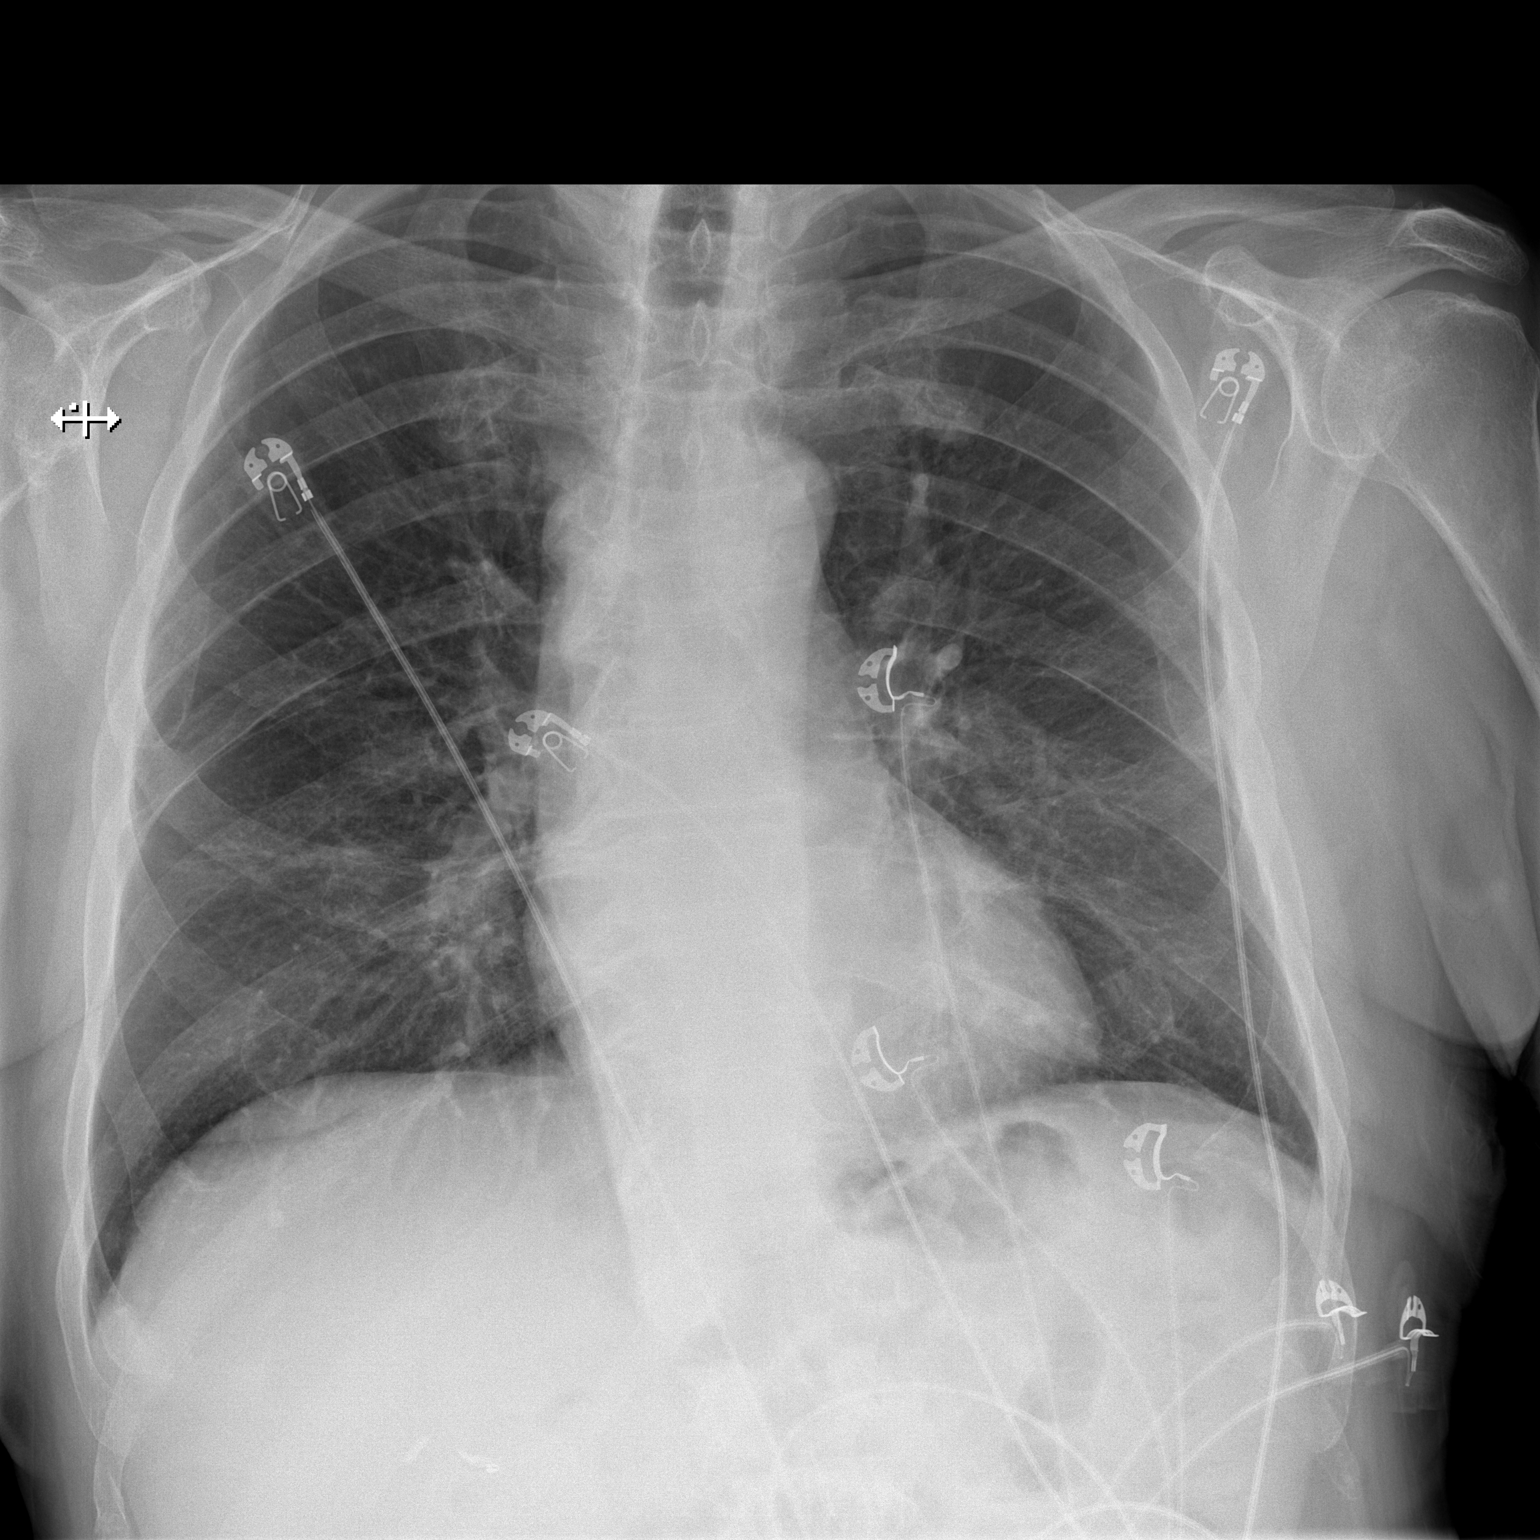

[w chest lat]
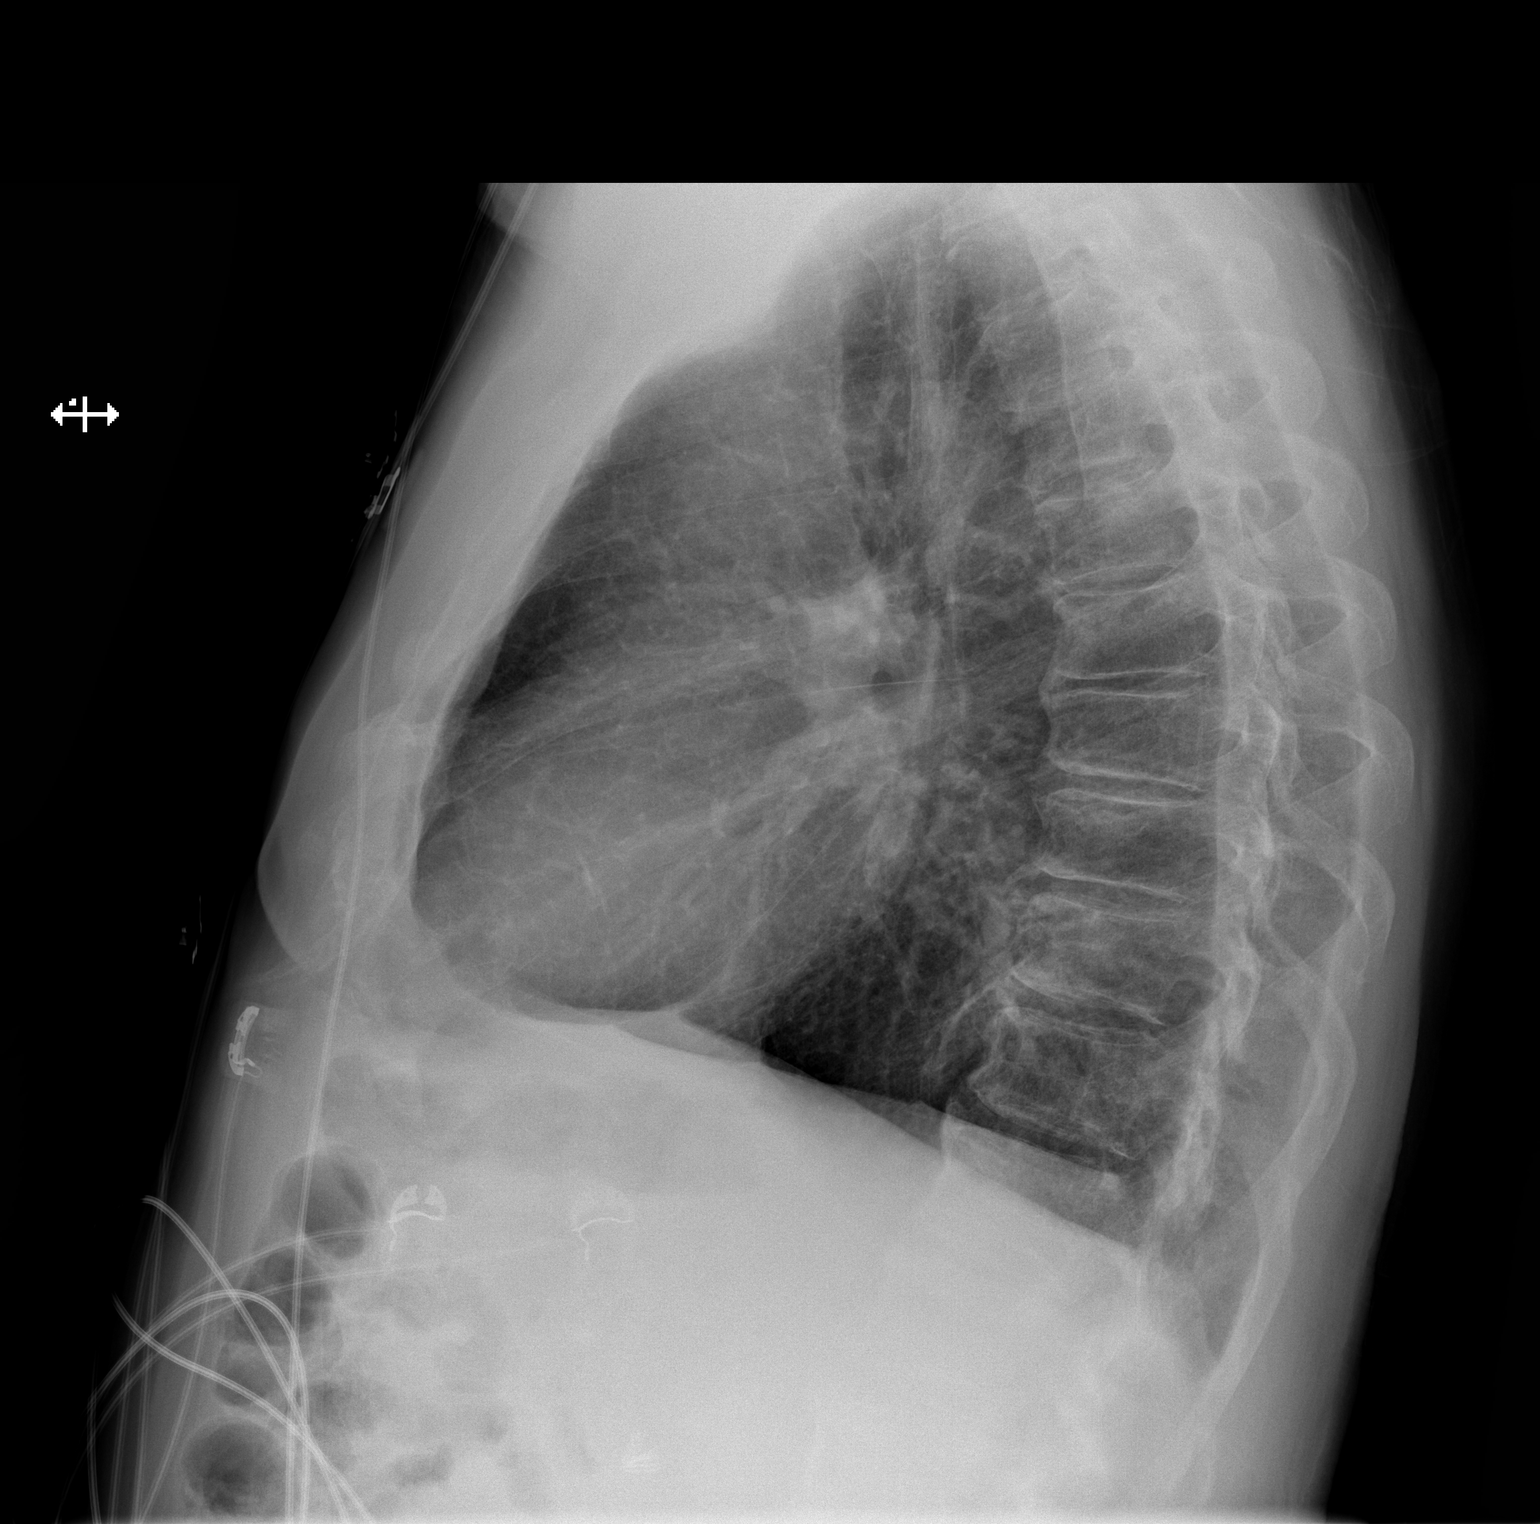

[2 of 2 positions shown; findings below may reference images not displayed]

FINDINGS: Heart size within normal limits. No appreciable airspace
consolidation or pulmonary edema. No evidence of pleural effusion or
pneumothorax. No acute bony abnormality identified. Thoracic
spondylosis. Partially imaged ACDF hardware within the lower
cervical spine. Surgical clips within the right upper quadrant of
the abdomen.
IMPRESSION: No evidence of acute cardiopulmonary abnormality.

## 2021-07-09 ENCOUNTER — Encounter (HOSPITAL_COMMUNITY): Payer: Self-pay

## 2021-07-09 ENCOUNTER — Emergency Department (HOSPITAL_COMMUNITY)
Admission: EM | Admit: 2021-07-09 | Discharge: 2021-07-09 | Disposition: A | Discharge status: Home / Self Care | Payer: Medicare PPO | Attending: Emergency Medicine | Admitting: Emergency Medicine

## 2021-07-09 DIAGNOSIS — R339 Retention of urine, unspecified: Secondary | ICD-10-CM

## 2021-07-09 DIAGNOSIS — R1030 Lower abdominal pain, unspecified: Secondary | ICD-10-CM | POA: Diagnosis not present

## 2021-07-09 LAB — URINALYSIS, ROUTINE W REFLEX MICROSCOPIC
Bilirubin Urine: NEGATIVE
Glucose, UA: NEGATIVE mg/dL
Hgb urine dipstick: NEGATIVE
Ketones, ur: NEGATIVE mg/dL
Leukocytes,Ua: NEGATIVE
Nitrite: NEGATIVE
Protein, ur: 100 mg/dL — AB
Specific Gravity, Urine: 1.011 (ref 1.005–1.030)
pH: 6 (ref 5.0–8.0)

## 2021-07-09 LAB — I-STAT CHEM 8, ED
BUN: 12 mg/dL (ref 8–23)
Calcium, Ion: 1.29 mmol/L (ref 1.15–1.40)
Chloride: 104 mmol/L (ref 98–111)
Creatinine, Ser: 1 mg/dL (ref 0.61–1.24)
Glucose, Bld: 175 mg/dL — ABNORMAL HIGH (ref 70–99)
HCT: 38 % — ABNORMAL LOW (ref 39.0–52.0)
Hemoglobin: 12.9 g/dL — ABNORMAL LOW (ref 13.0–17.0)
Potassium: 3.6 mmol/L (ref 3.5–5.1)
Sodium: 139 mmol/L (ref 135–145)
TCO2: 24 mmol/L (ref 22–32)

## 2021-07-09 NOTE — ED Provider Notes (Signed)
?[facility] DEPT ?Provider Note ? ? ?CSN: [id number] ?Arrival date & time: 07/09/21  1146 ? ?  ? ?History ? ?Chief Complaint  ?Patient presents with  ? Urinary Retention  ? ? ?Dwayne Vargas is a 76 y.o. male. ? ?HPI ? ?Patient has history of bipolar disorder hypertension, depression, diabetes penile cancer, prostatic hypertrophy and previous prostate surgery.  Patient presented to the ED today with complaints of inability to urinate.  Patient states has not been able to urinate since yesterday.  He feels like his bladder is very distended.  He is having lower abdominal discomfort.  He denies any fevers or chills.  No vomiting or diarrhea ? ?Home Medications ?Prior to Admission medications   ?Medication Sig Start Date End Date Taking? Authorizing Provider  ?amLODipine (NORVASC) 5 MG tablet Take 2 tablets (10 mg total) by mouth daily. 01/18/20 03/18/20  Skeet Latch, MD  ?ARIPiprazole (ABILIFY) 5 MG tablet Take 5 mg by mouth daily.    [provider]  ?Baclofen 5 MG TABS Take 5 mg by mouth 2 (two) times daily as needed for pain. 01/05/20   [provider]  ?diclofenac (VOLTAREN) 75 MG EC tablet Take 75 mg by mouth 2 (two) times daily.    [provider]  ?fluticasone (FLONASE) 50 MCG/ACT nasal spray Place 1-2 sprays into both nostrils daily as needed for allergies or rhinitis.    [provider]  ?gabapentin (NEURONTIN) 100 MG capsule Take 200 mg by mouth at bedtime.  01/26/19   [provider]  ?lamoTRIgine (LAMICTAL) 200 MG tablet Take 200 mg by mouth at bedtime. 07/29/14   [provider]  ?losartan (COZAAR) 50 MG tablet Take 1 tablet (50 mg total) by mouth in the morning and at bedtime. 01/19/20   Skeet Latch, MD  ?Multiple Vitamin (MULTIVITAMIN WITH MINERALS) TABS tablet Take 1 tablet by mouth daily.    [provider]  ?omeprazole (PRILOSEC) 20 MG capsule Take 20 mg by mouth daily.    [provider]   ?rosuvastatin (CRESTOR) 20 MG tablet Take 1 tablet (20 mg total) by mouth every evening. 01/12/20 04/11/20  Kayleen Memos, DO  ?traZODone (DESYREL) 50 MG tablet Take 100 mg by mouth at bedtime.     [provider]  ?   ? ?Allergies    ?Aspirin, Contrast media [iodinated contrast media], Fentanyl, Morphine and related, Oxycontin [oxycodone hcl], Shellfish allergy, and Diazepam   ? ?Review of Systems   ?Review of Systems  ?Constitutional:  Negative for fever.  ? ?Physical Exam ?Updated Vital Signs ?BP (!) 157/79   Pulse 69   Temp 98.2 ?F (36.8 ?C) (Oral)   Resp 13   SpO2 99%  ?Physical Exam ?Vitals and nursing note reviewed.  ?Constitutional:   ?   General: He is not in acute distress. ?   Appearance: He is well-developed.  ?HENT:  ?   Head: Normocephalic and atraumatic.  ?   Right Ear: External ear normal.  ?   Left Ear: External ear normal.  ?Eyes:  ?   General: No scleral icterus.    ?   Right eye: No discharge.     ?   Left eye: No discharge.  ?   Conjunctiva/sclera: Conjunctivae normal.  ?Neck:  ?   Trachea: No tracheal deviation.  ?Cardiovascular:  ?   Rate and Rhythm: Normal rate and regular rhythm.  ?Pulmonary:  ?   Effort: Pulmonary effort is normal. No respiratory distress.  ?  Breath sounds: Normal breath sounds. No stridor.  ?Abdominal:  ?   General: There is no distension.  ?   Palpations: Abdomen is soft.  ?   Tenderness: There is abdominal tenderness.  ?   Comments: Suprapubic region  ?Musculoskeletal:     ?   General: No swelling or deformity.  ?   Cervical back: Neck supple.  ?Skin: ?   General: Skin is warm and dry.  ?   Findings: No rash.  ?Neurological:  ?   Mental Status: He is alert.  ?   Cranial Nerves: Cranial nerve deficit: no gross deficits.  ? ? ?ED Results / Procedures / Treatments   ?Labs ?(all labs ordered are listed, but only abnormal results are displayed) ?Labs Reviewed  ?URINALYSIS, ROUTINE W REFLEX MICROSCOPIC - Abnormal; Notable for the following components:  ?     Result Value  ? Protein, ur 100 (*)   ? Bacteria, UA RARE (*)   ? All other components within normal limits  ?I-STAT CHEM 8, ED - Abnormal; Notable for the following components:  ? Glucose, Bld 175 (*)   ? Hemoglobin 12.9 (*)   ? HCT 38.0 (*)   ? All other components within normal limits  ? ? ?EKG ?None ? ?Radiology ?No results found. ? ?Procedures ?Procedures  ? ? ?Medications Ordered in ED ?Medications - No data to display ? ?ED Course/ Medical Decision Making/ A&P ?Clinical Course as of 07/09/21 1427  ?Sun Jul 09, 2021  ?1355 Urinalysis, Routine w reflex microscopic Urine, Catheterized(!) ?Urinalysis without signs of infection [JK]  ?[place], ED (not at [facility] or [facility])(!) ?No significant anemia.  Hemoglobin stable [JK]  ?  ?Clinical Course User Index ?[JK] Dorie Rank, MD  ? ?                        ?Medical Decision Making ?Patient presented with complaints concerning for acute urinary retention.  Also consider the possibility of acute renal failure ? ?Problems Addressed: ?Urinary retention: acute illness or injury that poses a threat to life or bodily functions ? ?Amount and/or Complexity of Data Reviewed ?External Data Reviewed: notes. ?   Details: Previous outpatient visits ?Labs: ordered. Decision-making details documented in ED Course. ? ?Risk ?Risk Details: Patient presented with acute urinary tension.  He did have greater than 400 cc of urine when the Foley catheter was inserted.  Patient is having some discomfort associated with the catheter but overall symptoms are improving.  No signs of urinary tract infection.  Will discharge home with catheter in place.  Outpatient follow-up with urology ? ? ? ? ? ? ? ? ? ?Final Clinical Impression(s) / ED Diagnoses ?Final diagnoses:  ?Urinary retention  ? ? ?Rx / DC Orders ?ED Discharge Orders   ? ? None  ? ?  ? ? ?  ?Dorie Rank, MD ?07/09/21 1427 ? ?

## 2021-07-09 NOTE — ED Triage Notes (Signed)
Pt states he has not been able to urinate fully for 36 hrs. Had "dribbling" yesterday. This morning, unable to urinate at all. States hx of same.  ?

## 2021-07-09 NOTE — Discharge Instructions (Addendum)
Keep the catheter in place.  Follow-up with the urologist for further evaluation.  Take over-the-counter medications as needed for pain and discomfort ?

## 2021-08-23 ENCOUNTER — Ambulatory Visit
Admission: RE | Admit: 2021-08-23 | Discharge: 2021-08-23 | Disposition: A | Discharge status: Home / Self Care | Payer: Medicare PPO | Source: Ambulatory Visit | Attending: Orthopedic Surgery | Admitting: Orthopedic Surgery

## 2021-08-23 ENCOUNTER — Other Ambulatory Visit: Payer: Self-pay | Admitting: Orthopedic Surgery

## 2021-08-23 DIAGNOSIS — M4326 Fusion of spine, lumbar region: Secondary | ICD-10-CM

## 2021-08-23 DIAGNOSIS — M4322 Fusion of spine, cervical region: Secondary | ICD-10-CM

## 2021-09-03 ENCOUNTER — Other Ambulatory Visit: Payer: Medicare PPO

## 2021-11-02 ENCOUNTER — Emergency Department (HOSPITAL_BASED_OUTPATIENT_CLINIC_OR_DEPARTMENT_OTHER)
Admission: EM | Admit: 2021-11-02 | Discharge: 2021-11-03 | Discharge status: Against Medical Advice | Payer: Medicare PPO | Attending: Emergency Medicine | Admitting: Emergency Medicine

## 2021-11-02 ENCOUNTER — Other Ambulatory Visit: Payer: Self-pay

## 2021-11-02 ENCOUNTER — Encounter (HOSPITAL_BASED_OUTPATIENT_CLINIC_OR_DEPARTMENT_OTHER): Payer: Self-pay

## 2021-11-02 DIAGNOSIS — Z5321 Procedure and treatment not carried out due to patient leaving prior to being seen by health care provider: Secondary | ICD-10-CM | POA: Diagnosis not present

## 2021-11-02 DIAGNOSIS — R21 Rash and other nonspecific skin eruption: Secondary | ICD-10-CM | POA: Insufficient documentation

## 2021-11-02 NOTE — ED Notes (Signed)
Patient approached nurses station, upset about the wait for the dr. Provider notified. Patient again exited the room threw his BP cuff at the nurses station and stated he was leaving. Nursing advised patient to stay and the doctor would be in shortly. Patient did not stop and exited unit.

## 2021-11-02 NOTE — ED Provider Notes (Signed)
Patient eloped prior to being evaluated or examined.   Rondel Baton, MD 11/03/21 1110

## 2021-11-02 NOTE — ED Triage Notes (Signed)
Pt has been dealing with on/off rash and "unbearable itching" Has been seen at [facility] for the same, could not get anyone to call him back Anxious in triage

## 2021-11-11 ENCOUNTER — Encounter (HOSPITAL_COMMUNITY): Payer: Self-pay

## 2021-11-11 ENCOUNTER — Emergency Department (HOSPITAL_COMMUNITY)
Admission: EM | Admit: 2021-11-11 | Discharge: 2021-11-11 | Disposition: A | Discharge status: Home / Self Care | Payer: Medicare PPO | Attending: Emergency Medicine | Admitting: Emergency Medicine

## 2021-11-11 ENCOUNTER — Other Ambulatory Visit: Payer: Self-pay

## 2021-11-11 DIAGNOSIS — E119 Type 2 diabetes mellitus without complications: Secondary | ICD-10-CM | POA: Insufficient documentation

## 2021-11-11 DIAGNOSIS — R21 Rash and other nonspecific skin eruption: Secondary | ICD-10-CM | POA: Diagnosis present

## 2021-11-11 LAB — CBG MONITORING, ED: Glucose-Capillary: 133 mg/dL — ABNORMAL HIGH (ref 70–99)

## 2021-11-11 MED ORDER — DEXAMETHASONE SODIUM PHOSPHATE 10 MG/ML IJ SOLN
10.0000 mg | Freq: Once | INTRAMUSCULAR | Status: AC
  Administered 2021-11-11: 10 mg via INTRAMUSCULAR
  Filled 2021-11-11: qty 1

## 2021-11-11 MED ORDER — PERMETHRIN 5 % EX CREA: TOPICAL_CREAM | CUTANEOUS | 0 refills | Status: DC

## 2021-11-11 MED ORDER — HYDROXYZINE HCL 25 MG PO TABS: 25.0000 mg | ORAL_TABLET | Freq: Four times a day (QID) | ORAL | 0 refills | Status: DC | PRN

## 2021-11-11 NOTE — ED Notes (Signed)
CBG 133 

## 2021-11-11 NOTE — ED Triage Notes (Signed)
Patient c/o intermittent  rash on his neck, trunk, and top of buttocks for years and says he has "unbearable itching." Patient states he has been seen at multiple places for the same.

## 2021-11-11 NOTE — ED Provider Notes (Signed)
[facility] DEPT Provider Note   CSN: [id number] Arrival date & time: 11/11/21  0254     History  Chief Complaint  Patient presents with   Rash    Dwayne Vargas is a 76 y.o. male.  76 year old male with history of diabetes presents with complaint of rash to his trunk, most bothersome around his neck and waist with intense itching. Rash has been an ongoing problem for 3-4 years, severity waxes and wanes.  Has been to his primary care as well as dermatology for same without resolution.  Denies any changes in his medications or new medications.  PCP recently provided a clear gel medication that he has been applying without improvement. Patient is scheduled to have a procedure on his prostate in the coming weeks and prefers to have his rash better under control before then. Notes the last time he came to the ER he was given a shot and the rash improved for 3 days.        Home Medications Prior to Admission medications   Medication Sig Start Date End Date Taking? Authorizing Provider  hydrOXYzine (ATARAX) 25 MG tablet Take 1 tablet (25 mg total) by mouth every 6 (six) hours as needed for itching. 11/11/21  Yes Tacy Learn, PA-C  permethrin (ELIMITE) 5 % cream Apply to affected area once 11/11/21  Yes Suella Broad A, PA-C  amLODipine (NORVASC) 5 MG tablet Take 2 tablets (10 mg total) by mouth daily. 01/18/20 03/18/20  Skeet Latch, MD  ARIPiprazole (ABILIFY) 5 MG tablet Take 5 mg by mouth daily.    [provider]  Baclofen 5 MG TABS Take 5 mg by mouth 2 (two) times daily as needed for pain. 01/05/20   [provider]  diclofenac (VOLTAREN) 75 MG EC tablet Take 75 mg by mouth 2 (two) times daily.    [provider]  fluticasone (FLONASE) 50 MCG/ACT nasal spray Place 1-2 sprays into both nostrils daily as needed for allergies or rhinitis.    [provider]  gabapentin (NEURONTIN) 100 MG capsule Take 200 mg by mouth at  bedtime.  01/26/19   [provider]  lamoTRIgine (LAMICTAL) 200 MG tablet Take 200 mg by mouth at bedtime. 07/29/14   [provider]  losartan (COZAAR) 50 MG tablet Take 1 tablet (50 mg total) by mouth in the morning and at bedtime. 01/19/20   Skeet Latch, MD  Multiple Vitamin (MULTIVITAMIN WITH MINERALS) TABS tablet Take 1 tablet by mouth daily.    [provider]  omeprazole (PRILOSEC) 20 MG capsule Take 20 mg by mouth daily.    [provider]  rosuvastatin (CRESTOR) 20 MG tablet Take 1 tablet (20 mg total) by mouth every evening. 01/12/20 04/11/20  Kayleen Memos, DO  traZODone (DESYREL) 50 MG tablet Take 100 mg by mouth at bedtime.     [provider]      Allergies    Aspirin, Contrast media [iodinated contrast media], Fentanyl, Morphine and related, Oxycontin [oxycodone hcl], Shellfish allergy, and Diazepam    Review of Systems   Review of Systems Negative except as per HPI Physical Exam Updated Vital Signs BP (!) 153/85 (BP Location: Left Arm)   Pulse 75   Temp 97.7 F (36.5 C) (Oral)   Resp 18   Ht 5\' 11"  (1.803 m)   Wt 90.7 kg   SpO2 98%   BMI 27.89 kg/m  Physical Exam Vitals and nursing note reviewed.  Constitutional:  General: He is not in acute distress.    Appearance: He is well-developed. He is not ill-appearing or toxic-appearing.  HENT:     Head: Normocephalic and atraumatic.     Right Ear: Hearing normal. No middle ear effusion.     Left Ear: Hearing normal.  No middle ear effusion.     Mouth/Throat:     Pharynx: Uvula midline. No uvula swelling.  Eyes:     Pupils: Pupils are equal, round, and reactive to light.  Cardiovascular:     Pulses: Normal pulses.  Musculoskeletal:     Cervical back: Neck supple.     Right lower leg: No edema.     Left lower leg: No edema.  Lymphadenopathy:     Cervical: No cervical adenopathy.  Skin:    General: Skin is warm and dry.     Findings: Rash present.      Comments: Raised rash to trunk around neck area, upper back/chest, waistline.   Neurological:     Mental Status: He is alert and oriented to person, place, and time.     Sensory: No sensory deficit.     Motor: No weakness.  Psychiatric:        Behavior: Behavior normal.     ED Results / Procedures / Treatments   Labs (all labs ordered are listed, but only abnormal results are displayed) Labs Reviewed  CBG MONITORING, ED - Abnormal; Notable for the following components:      Result Value   Glucose-Capillary 133 (*)    All other components within normal limits    EKG None  Radiology No results found.  Procedures Procedures    Medications Ordered in ED Medications  dexamethasone (DECADRON) injection 10 mg (10 mg Intramuscular Given 11/11/21 0941)    ED Course/ Medical Decision Making/ A&P                           Medical Decision Making Risk Prescription drug management.   76 year old male with past medical history of diabetes managed with diet presents with concern for a rash to his trunk primarily around his neck, upper chest, upper back and waistline/groin areas.  States his rash has been present for the past 2 to 3 years, waxes and wanes in intensity, is pruritic.  Patient has been to dermatology, had a biopsy, followed by PCP without formal diagnosis or source of his rash identified.  He is currently on an unknown clear gel that he applies as prescribed by his PCP without improvement.  States that he is in the midst of a flare with worsening pruritus which is intense which prompted him to present to the ER today.  Patient takes medications at night to help him sleep, is unable to identify if the rash is more bothersome at night for this reason.  States the last time he came to the emergency room he was given an injection of steroids which calmed the rash down for about 3 days and was requesting same today.  Due to his history of diabetes, CBG was checked prior to  administration of IM Decadron, glucose is fairly well controlled.  Discussed concerns for steroids increasing his blood sugar and need to monitor return for feeling poorly.  Recommend follow-up with his primary care and dermatologist and to continue to work on identifying the cause of his rash.  Consider possible scabies as this has not been covered and could cause an intensely pruritic rash.  Given Elimite and discussed proper use.  Also provided with hydroxyzine to help with his itching.        Final Clinical Impression(s) / ED Diagnoses Final diagnoses:  Rash    Rx / DC Orders ED Discharge Orders          Ordered    permethrin (ELIMITE) 5 % cream        11/11/21 0827    hydrOXYzine (ATARAX) 25 MG tablet  Every 6 hours PRN        11/11/21 0827              Jeannie Fend, PA-C 11/11/21 1144    Jacalyn Lefevre, MD 11/11/21 1433

## 2021-11-11 NOTE — Discharge Instructions (Signed)
Steroid injection provided today as discussed. This can cause high blood sugar, monitor or recheck if feeling poorly.  Rash otherwise not improving or diagnosed on workup provided outpatient, although rare, consider scabies in the setting of a rash that is ongoing. Recommend trying Elimite cream. This is applied at bedtime and then washed off the following morning as a single treatment. Be sure to wash bedding the morning you shower/complete the treatment and wash clothing to prevent reinfection. The rash and itching can persist for several weeks after doing this treatment as it takes time for the skin to heal.   Atarax for itching as prescribed. This medication can make you sleepy-do not drive or machinery when you take this medication.  This medication is similar to Benadryl, do not take additional Benadryl or apply Benadryl topically while taking Atarax.

## 2021-11-20 ENCOUNTER — Emergency Department (HOSPITAL_COMMUNITY): Payer: Medicare PPO

## 2021-11-20 ENCOUNTER — Emergency Department (HOSPITAL_COMMUNITY)
Admission: EM | Admit: 2021-11-20 | Discharge: 2021-11-20 | Discharge status: Against Medical Advice | Payer: Medicare PPO | Attending: Emergency Medicine | Admitting: Emergency Medicine

## 2021-11-20 ENCOUNTER — Encounter (HOSPITAL_COMMUNITY): Payer: Self-pay

## 2021-11-20 ENCOUNTER — Other Ambulatory Visit: Payer: Self-pay

## 2021-11-20 DIAGNOSIS — R079 Chest pain, unspecified: Secondary | ICD-10-CM | POA: Diagnosis present

## 2021-11-20 DIAGNOSIS — I10 Essential (primary) hypertension: Secondary | ICD-10-CM | POA: Diagnosis not present

## 2021-11-20 DIAGNOSIS — Z5321 Procedure and treatment not carried out due to patient leaving prior to being seen by health care provider: Secondary | ICD-10-CM | POA: Insufficient documentation

## 2021-11-20 LAB — CBC
HCT: 42.7 % (ref 39.0–52.0)
Hemoglobin: 14.5 g/dL (ref 13.0–17.0)
MCH: 28.8 pg (ref 26.0–34.0)
MCHC: 34 g/dL (ref 30.0–36.0)
MCV: 84.7 fL (ref 80.0–100.0)
Platelets: 242 10*3/uL (ref 150–400)
RBC: 5.04 MIL/uL (ref 4.22–5.81)
RDW: 13.4 % (ref 11.5–15.5)
WBC: 13.3 10*3/uL — ABNORMAL HIGH (ref 4.0–10.5)
nRBC: 0 % (ref 0.0–0.2)

## 2021-11-20 LAB — BASIC METABOLIC PANEL
Anion gap: 8 (ref 5–15)
BUN: 24 mg/dL — ABNORMAL HIGH (ref 8–23)
CO2: 24 mmol/L (ref 22–32)
Calcium: 10 mg/dL (ref 8.9–10.3)
Chloride: 104 mmol/L (ref 98–111)
Creatinine, Ser: 0.99 mg/dL (ref 0.61–1.24)
GFR, Estimated: >60 mL/min (ref >60)
Glucose, Bld: 257 mg/dL — ABNORMAL HIGH (ref 70–99)
Potassium: 3.4 mmol/L — ABNORMAL LOW (ref 3.5–5.1)
Sodium: 136 mmol/L (ref 135–145)

## 2021-11-20 LAB — TROPONIN I (HIGH SENSITIVITY)
Troponin I (High Sensitivity): 7 ng/L (ref <18)
Troponin I (High Sensitivity): 7 ng/L (ref <18)

## 2021-11-20 NOTE — ED Triage Notes (Signed)
Pt reports with chest pains that shoot through his left chest x 3 days. Pt reports having HTN.

## 2021-11-20 NOTE — ED Notes (Signed)
Patient c/o increased chest pain. Patient called back into Triage #2  BP-200/104, HR-67, Sats 99%. EKG repeated. 2nd troponin due at 0818. Patient placed in phlebotomy room.

## 2021-11-22 ENCOUNTER — Other Ambulatory Visit (HOSPITAL_BASED_OUTPATIENT_CLINIC_OR_DEPARTMENT_OTHER): Payer: Self-pay | Admitting: Family Medicine

## 2021-11-22 DIAGNOSIS — R079 Chest pain, unspecified: Secondary | ICD-10-CM

## 2021-11-25 ENCOUNTER — Ambulatory Visit (HOSPITAL_BASED_OUTPATIENT_CLINIC_OR_DEPARTMENT_OTHER): Payer: Medicare PPO

## 2021-12-05 ENCOUNTER — Encounter (HOSPITAL_BASED_OUTPATIENT_CLINIC_OR_DEPARTMENT_OTHER): Payer: Self-pay | Admitting: Cardiovascular Disease

## 2021-12-05 ENCOUNTER — Ambulatory Visit (HOSPITAL_BASED_OUTPATIENT_CLINIC_OR_DEPARTMENT_OTHER): Payer: Medicare PPO | Admitting: Cardiovascular Disease

## 2021-12-05 DIAGNOSIS — I10 Essential (primary) hypertension: Secondary | ICD-10-CM

## 2021-12-05 DIAGNOSIS — R079 Chest pain, unspecified: Secondary | ICD-10-CM

## 2021-12-05 MED ORDER — COLCHICINE 0.6 MG PO TABS: 0.6000 mg | ORAL_TABLET | Freq: Every day | ORAL | 0 refills | Status: DC

## 2021-12-05 NOTE — Assessment & Plan Note (Addendum)
Dwayne Vargas has been struggling with chest pain.  He was recently diagnosed with bronchitis but it is unclear whether that was really was causing his chest pain.  His symptoms are not pleuritic.  It does seem classic for pericarditis and that it is worse when he lies down and he gets relief when leaning forward.  No clear evidence of pericarditis on his EKG today.  We will check a sed rate.  There is low risk giving a trial of colchicine 0.6 mg daily.  If his sed rate is significantly elevated we will add NSAIDs.  He already had a chest CT that was negative for PE.  Cardiac enzymes were negative and he has no ischemic symptoms.  Although he does have some nonobstructive CAD, I do not think there is much utility in repeating an ischemic evaluation at this time.  We will also get an echocardiogram.  He does have a rash on his chest but it is diffuse and has been there for a long time.  It is not consistent with a dermatomal rash or since shingles.  He has a history of lumbar and cervical spine surgeries.  It is also possible this could be radicular pain.

## 2021-12-05 NOTE — Progress Notes (Signed)
Cardiology Office Note:    Date:  12/05/2021   ID:  Dwayne Vargas, DOB 06-13-1945, MRN [id number]  PCP:  Dwayne Palmer, MD   [facility] Providers Cardiologist:  Chilton Si, MD     Referring MD: Darrow Bussing, MD   No chief complaint on file.   History of Present Illness:    Dwayne Vargas is a 76 y.o. male with a hx of mild carotid stenosis, hypertension, diabetes, and bipolar disorder, here for follow-up. He was first seen in the hospital 12/2019 with chest pain and hypertensive urgency. At the time he had lost 40 lbs and was trying to get rid of any unnecessary medications. He stopped taking his losartan and his blood pressure was over 200 systolic. Losartan was resumed and amlodipine was added. He had a coronary CTA which revealed mild disease in the RCA and moderate RI disease. His statin was increased. He saw our pharmacist and his blood pressure remained uncontrolled. He attributed this to pain and psychological distress from his bipolar disorder being unmanaged.  Losartan was increased and we recommended follow-up, but he has not been seen since then. He was seen in the ED at [facility] 10/2021. Blood pressure was 167/153. He reported chest and shoulder pain. Chest CT was negative for PE, but he was treated for pneumonia.  Today, he complains of left chest/breast pain that can be severely bothersome, or intermittently mild. He initially felt like he was having a heart attack. Lately at its worst, he will feel shooting pain from his lower back to his left side/arm, but that specifically does not occur often. Also he states that the severity of the pain depends on taking hydrocodone. This morning at 6 AM he woke up in pain, and took one hydrocodone tablet. His pain then resolved around 9 AM. His body position also affects the severity of his chest pain. While sitting far back in the couch, the added pressure causes more pain. If he leans forward he will alleviate some of his pain. He has  completed his antibiotic course given to him at [facility] with no change in his chest pain. No change with taking deep breaths or walking up inclines. About 10 years ago he had viral pneumonia; his chest pain is similar to that time. Additionally, he has a rash on his chest, and also between the shoulder blades and down his back. In the past month he had 2 biopsies but the etiology of his rash has not been determined. He complains of associated pruritis. Of note, he endorses bipolar disorder, with emotional distress potentially affecting his blood pressure significantly. In clinic today his blood pressure is well controlled at 112/62. He denies any palpitations, shortness of breath, or peripheral edema. No lightheadedness, headaches, syncope, orthopnea, or PND.   Past Medical History:  Diagnosis Date   Anxiety    Benign localized prostatic hyperplasia with lower urinary tract symptoms (LUTS)    Bipolar 1 disorder (HCC)    Complication of anesthesia    limited neck motion due to cervical spine surgery   Depression    ED (erectile dysfunction) of organic origin    History of penile cancer    s/p  excision of cancer 2013   HOH (hard of hearing)    left ear   Hypertension    Irritable bowel    intermittant diarrhea  secondary to antibiotics   Legally blind in left eye, as defined in [place]    Stricture, prostate    urethra  Type 2 diabetes mellitus (HCC)    Weak urinary stream    intermittant    Past Surgical History:  Procedure Laterality Date   CATARACT EXTRACTION W/ INTRAOCULAR LENS  IMPLANT, BILATERAL  2007   CERVICAL FUSION  05/ 2016   [facility]   C2 -- C4   CHOLECYSTECTOMY  2013  approx   EXCISION PENILE CANCER  2013   NASAL SEPTUM SURGERY  x2  last one 1990's   SHOULDER ARTHROSCOPY WITH ROTATOR CUFF REPAIR Left 1990's   TOTAL KNEE ARTHROPLASTY Bilateral left 2007/  right and revision in 2009   TRANSURETHRAL INCISION OF PROSTATE N/A 11/21/2015   Procedure: TRANSURETHRAL INCISION OF THE  PROSTATE (TUIP);  Surgeon: Hildred LaserBrian James Budzyn, MD;  [facility] [place];  Service: Urology;  Laterality: N/A;   TRANSURETHRAL RESECTION OF PROSTATE N/A 07/22/2015   Procedure: TRANSURETHRAL RESECTION OF THE PROSTATE ;  Surgeon: Hildred LaserBrian James Budzyn, MD;  Location: [facility];  Service: Urology;  Laterality: N/A;   VIDEO ASSISTED THORACOSCOPY (VATS)/THOROCOTOMY Left 08/12/2014   Procedure: VIDEO ASSISTED THORACOSCOPY (VATS)/THOROCOTOMY, DRAINAGE OF EMPYEMA;  Surgeon: Alleen BorneBryan K Bartle, MD;  Location: [facility];  Service: Thoracic;  Laterality: Left;    Current Medications: Current Meds  Medication Sig   amLODipine (NORVASC) 2.5 MG tablet Take 2.5 mg by mouth daily.   colchicine 0.6 MG tablet Take 1 tablet (0.6 mg total) by mouth daily.   DUPIXENT 300 MG/2ML SOPN Inject into the skin every 14 (fourteen) days.   fluticasone (FLONASE) 50 MCG/ACT nasal spray Place 1-2 sprays into both nostrils daily as needed for allergies or rhinitis.   HYDROcodone-acetaminophen (NORCO/VICODIN) 5-325 MG tablet Take 1 tablet by mouth 4 (four) times daily as needed.   lamoTRIgine (LAMICTAL) 100 MG tablet Take 100 mg by mouth at bedtime.   losartan (COZAAR) 100 MG tablet Take 100 mg by mouth daily.   Multiple Vitamin (MULTIVITAMIN WITH MINERALS) TABS tablet Take 1 tablet by mouth daily.   permethrin (ELIMITE) 5 % cream Apply to affected area once   rosuvastatin (CRESTOR) 5 MG tablet Take 5 mg by mouth daily.   traZODone (DESYREL) 50 MG tablet Take 100 mg by mouth at bedtime.      Allergies:   Aspirin, Contrast media [iodinated contrast media], Fentanyl, Morphine and related, Oxycontin [oxycodone hcl], Shellfish allergy, and Diazepam   Social History   Socioeconomic History   Marital status: Widowed    Spouse name: Not on file   Number of children: Not on file   Years of education: Not on file   Highest education level: Not on file  Occupational History   Not on file  Tobacco Use   Smoking status:  Former    Packs/day: 1.00    Years: 53.00    Total pack years: 53.00    Types: Cigarettes    Quit date: 2019    Years since quitting: 4.7   Smokeless tobacco: Never  Vaping Use   Vaping Use: Never used  Substance and Sexual Activity   Alcohol use: No    Alcohol/week: 0.0 standard drinks of alcohol   Drug use: No   Sexual activity: Never  Other Topics Concern   Not on file  Social History Narrative   Not on file   Social Determinants of Health   Financial Resource Strain: Not on file  Food Insecurity: Not on file  Transportation Needs: Not on file  Physical Activity: Not on file  Stress: Not on file  Social Connections: Not  on file     Family History: The patient's family history includes Diabetes in his cousin.  ROS:   Please see the history of present illness.    (+) Chest pain (+) Rash of chest and back with pruritis All other systems reviewed and are negative.  EKGs/Labs/Other Studies Reviewed:    The following studies were reviewed today:  CT Chest  11/25/2021  ([facility]): ::IMPRESSION::  1.  No pulmonary embolism.  2.  Findings of bronchitis.  3.  Mild basilar and peripheral predominant groundglass reticulation  favored atelectasis in the setting of expiratory phase of imaging. Slightly  more nodular opacity in the peripheral left lower lobe is favored to  represent focal atelectasis.  4.  Prominent mediastinal and right greater than left hilar lymph nodes,  likely reactive.   Coronary CTA  01/12/2020: IMPRESSION: 1. Coronary calcium score of 1355. This was 85th percentile for age and sex matched control.   2. Normal coronary origin with right dominance.   3. There is mild plaque in the RCA, moderate in the RI and minimal in RCA and LCX. CAD-RADS 3.   4. Recommend aggressive risk factor modification including LDL goal <70.  Bilateral Carotid Doppler  01/11/2020: Summary:  Right Carotid: Velocities in the right ICA are consistent with a 1-39%   stenosis.                 Borderline >40%.   Left Carotid: Velocities in the left ICA are consistent with a 1-39%  stenosis.   Vertebrals:  Bilateral vertebral arteries demonstrate antegrade flow.  Subclavians: Normal flow hemodynamics were seen in bilateral subclavian arteries.   EKG:   EKG is personally reviewed. 12/05/2021:  EKG was not ordered.  Recent Labs: 11/20/2021: BUN 24; Creatinine, Ser 0.99; Hemoglobin 14.5; Platelets 242; Potassium 3.4; Sodium 136   Recent Lipid Panel    Component Value Date/Time   CHOL 145 01/12/2020 0533   TRIG 95 01/12/2020 0533   HDL 47 01/12/2020 0533   CHOLHDL 3.1 01/12/2020 0533   VLDL 19 01/12/2020 0533   LDLCALC 79 01/12/2020 0533     Risk Assessment/Calculations:           Physical Exam:    Wt Readings from Last 3 Encounters:  12/05/21 203 lb 1.6 oz (92.1 kg)  11/20/21 195 lb (88.5 kg)  11/11/21 200 lb (90.7 kg)     VS:  BP 112/62 (BP Location: Left Arm, Patient Position: Sitting, Cuff Size: Normal)   Pulse 69   Ht 5\' 11"  (1.803 m)   Wt 203 lb 1.6 oz (92.1 kg)   BMI 28.33 kg/m  , BMI Body mass index is 28.33 kg/m. GENERAL:  Well appearing HEENT: Pupils equal round and reactive, fundi not visualized, oral mucosa unremarkable NECK:  No jugular venous distention, waveform within normal limits, carotid upstroke brisk and symmetric, no bruits, no thyromegaly LUNGS:  Clear to auscultation bilaterally HEART:  RRR.  PMI not displaced or sustained,S1 and S2 within normal limits, no S3, no S4, no clicks, no rubs, no murmurs ABD:  Flat, positive bowel sounds normal in frequency in pitch, no bruits, no rebound, no guarding, no midline pulsatile mass, no hepatomegaly, no splenomegaly EXT:  2 plus pulses throughout, no edema, no cyanosis no clubbing SKIN:  No rashes no nodules NEURO:  Cranial nerves II through XII grossly intact, motor grossly intact throughout PSYCH:  Cognitively intact, oriented to person place and  time   ASSESSMENT:    1. Primary hypertension  2. Chest pain of uncertain etiology    PLAN:    HTN (hypertension) Blood pressure has been very labile but is controlled today.  Continue amlodipine and losartan.  He notes that his blood pressures very sensitive to his mood.  Fortunately he is not under much stress today other than trying to figure out what is going on with his chest pain.  Chest pain of uncertain etiology Mr. Debski has been struggling with chest pain.  He was recently diagnosed with bronchitis but it is unclear whether that was really was causing his chest pain.  His symptoms are not pleuritic.  It does seem classic for pericarditis and that it is worse when he lies down and he gets relief when leaning forward.  No clear evidence of pericarditis on his EKG today.  We will check a sed rate.  There is low risk giving a trial of colchicine 0.6 mg daily.  If his sed rate is significantly elevated we will add NSAIDs.  He already had a chest CT that was negative for PE.  Cardiac enzymes were negative and he has no ischemic symptoms.  Although he does have some nonobstructive CAD, I do not think there is much utility in repeating an ischemic evaluation at this time.  We will also get an echocardiogram.  He does have a rash on his chest but it is diffuse and has been there for a long time.  It is not consistent with a dermatomal rash or since shingles.  He has a history of lumbar and cervical spine surgeries.  It is also possible this could be radicular pain.        Disposition: FU with APP in 1 month.  Medication Adjustments/Labs and Tests Ordered: Current medicines are reviewed at length with the patient today.  Concerns regarding medicines are outlined above.   Orders Placed This Encounter  Procedures   Sedimentation rate   EKG 12-Lead   ECHOCARDIOGRAM COMPLETE   Meds ordered this encounter  Medications   colchicine 0.6 MG tablet    Sig: Take 1 tablet (0.6 mg total) by  mouth daily.    Dispense:  90 tablet    Refill:  0   Patient Instructions  Medication Instructions:  START COLCHICINE 0.1 MG DAILY   *If you need a refill on your cardiac medications before your next appointment, please call your pharmacy*  Lab Work: SED RATE TODAY   If you have labs (blood work) drawn today and your tests are completely normal, you will receive your results only by: [place] (if you have MyChart) OR A paper copy in the mail If you have any lab test that is abnormal or we need to change your treatment, we will call you to review the results.  Testing/Procedures: Your physician has requested that you have an echocardiogram. Echocardiography is a painless test that uses sound waves to create images of your heart. It provides your doctor with information about the size and shape of your heart and how well your heart's chambers and valves are working. This procedure takes approximately one hour. There are no restrictions for this procedure.  Follow-Up: At [facility], you and your health needs are our priority.  As part of our continuing mission to provide you with exceptional heart care, we have created designated Provider Care Teams.  These Care Teams include your primary Cardiologist (physician) and Advanced Practice Providers (APPs -  Physician Assistants and Nurse Practitioners) who all work together to provide you  with the care you need, when you need it.  We recommend signing up for the patient portal called "MyChart".  Sign up information is provided on this After Visit Summary.  MyChart is used to connect with patients for Virtual Visits (Telemedicine).  Patients are able to view lab/test results, encounter notes, upcoming appointments, etc.  Non-urgent messages can be sent to your provider as well.   To learn more about what you can do with MyChart, go to [email or web address].    Your next appointment:   1 month(s)  The format for your next  appointment:   In Person  Provider:   Gillian Shields, NP         [facility] Stump[facility],acting as a scribe for Chilton Si, MD.,have documented all relevant documentation on the behalf of Chilton Si, MD,as directed by  Chilton Si, MD while in the presence of Chilton Si, MD.  I, Margues Filippini [facility]. [facility] Salvia, MD have reviewed all documentation for this visit.  The documentation of the exam, diagnosis, procedures, and orders on 12/05/2021 are all accurate and complete.  Signed, Chilton Si, MD  12/05/2021 5:35 PM    [facility]

## 2021-12-05 NOTE — Patient Instructions (Signed)
Medication Instructions:  START COLCHICINE 0.1 MG DAILY   *If you need a refill on your cardiac medications before your next appointment, please call your pharmacy*  Lab Work: SED RATE TODAY   If you have labs (blood work) drawn today and your tests are completely normal, you will receive your results only by: [place] (if you have MyChart) OR A paper copy in the mail If you have any lab test that is abnormal or we need to change your treatment, we will call you to review the results.  Testing/Procedures: Your physician has requested that you have an echocardiogram. Echocardiography is a painless test that uses sound waves to create images of your heart. It provides your doctor with information about the size and shape of your heart and how well your heart's chambers and valves are working. This procedure takes approximately one hour. There are no restrictions for this procedure.  Follow-Up: At [facility], you and your health needs are our priority.  As part of our continuing mission to provide you with exceptional heart care, we have created designated Provider Care Teams.  These Care Teams include your primary Cardiologist (physician) and Advanced Practice Providers (APPs -  Physician Assistants and Nurse Practitioners) who all work together to provide you with the care you need, when you need it.  We recommend signing up for the patient portal called "MyChart".  Sign up information is provided on this After Visit Summary.  MyChart is used to connect with patients for Virtual Visits (Telemedicine).  Patients are able to view lab/test results, encounter notes, upcoming appointments, etc.  Non-urgent messages can be sent to your provider as well.   To learn more about what you can do with MyChart, go to [email or web address].    Your next appointment:   1 month(s)  The format for your next appointment:   In Person  Provider:   Laurann Montana, NP

## 2021-12-05 NOTE — Assessment & Plan Note (Signed)
Blood pressure has been very labile but is controlled today.  Continue amlodipine and losartan.  He notes that his blood pressures very sensitive to his mood.  Fortunately he is not under much stress today other than trying to figure out what is going on with his chest pain.

## 2021-12-06 LAB — SEDIMENTATION RATE: Sed Rate: 13 mm/hr (ref 0–30)

## 2021-12-19 ENCOUNTER — Ambulatory Visit (HOSPITAL_BASED_OUTPATIENT_CLINIC_OR_DEPARTMENT_OTHER): Payer: Medicare PPO

## 2021-12-19 DIAGNOSIS — R079 Chest pain, unspecified: Secondary | ICD-10-CM

## 2021-12-19 LAB — ECHOCARDIOGRAM COMPLETE
Area-P 1/2: 3.72 cm2
S' Lateral: 2.55 cm

## 2022-01-04 NOTE — Progress Notes (Signed)
Cardiology Clinic Note   Patient Name: Dwayne Vargas Date of Encounter: 01/05/2022  Primary Care Provider:  Mila Palmer, MD Primary Cardiologist:  Chilton Si, MD  Patient Profile    Dwayne Vargas is a 76 year-old male with a past medical history of CAD, mild carotid stenosis, hypertension, bipolar disorder, and T2DM.  Who presents to the clinic today for 1 month follow-up to review cardiac testing.  Past Medical History    Past Medical History:  Diagnosis Date   Anxiety    Benign localized prostatic hyperplasia with lower urinary tract symptoms (LUTS)    Bipolar 1 disorder (HCC)    Complication of anesthesia    limited neck motion due to cervical spine surgery   Depression    ED (erectile dysfunction) of organic origin    History of penile cancer    s/p  excision of cancer 2013   HOH (hard of hearing)    left ear   Hypertension    Irritable bowel    intermittant diarrhea  secondary to antibiotics   Legally blind in left eye, as defined in [place]    Stricture, prostate    urethra   Type 2 diabetes mellitus (HCC)    Weak urinary stream    intermittant   Past Surgical History:  Procedure Laterality Date   CATARACT EXTRACTION W/ INTRAOCULAR LENS  IMPLANT, BILATERAL  2007   CERVICAL FUSION  05/ 2016   [facility]   C2 -- C4   CHOLECYSTECTOMY  2013  approx   EXCISION PENILE CANCER  2013   NASAL SEPTUM SURGERY  x2  last one 1990's   SHOULDER ARTHROSCOPY WITH ROTATOR CUFF REPAIR Left 1990's   TOTAL KNEE ARTHROPLASTY Bilateral left 2007/  right and revision in 2009   TRANSURETHRAL INCISION OF PROSTATE N/A 11/21/2015   Procedure: TRANSURETHRAL INCISION OF THE PROSTATE (TUIP);  Surgeon: Hildred Laser, MD;  Location: [facility];  Service: Urology;  Laterality: N/A;   TRANSURETHRAL RESECTION OF PROSTATE N/A 07/22/2015   Procedure: TRANSURETHRAL RESECTION OF THE PROSTATE ;  Surgeon: Hildred Laser, MD;  Location: [facility];  Service: Urology;  Laterality: N/A;    VIDEO ASSISTED THORACOSCOPY (VATS)/THOROCOTOMY Left 08/12/2014   Procedure: VIDEO ASSISTED THORACOSCOPY (VATS)/THOROCOTOMY, DRAINAGE OF EMPYEMA;  Surgeon: Alleen Borne, MD;  Location: [facility];  Service: Thoracic;  Laterality: Left;    Allergies  Allergies  Allergen Reactions   Aspirin Other (See Comments)    GI upset   Contrast Media [Iodinated Contrast Media] Hives and Swelling   Fentanyl     Pt wishes not to have   Morphine And Related Other (See Comments)    Tremors    Oxycontin [Oxycodone Hcl] Other (See Comments)    "pt does not wish to have"   Shellfish Allergy Hives    All fish and shellfish w/ iodine   Diazepam Other (See Comments)    Patient does not want to use    History of Present Illness    Dwayne Vargas has a past medical history of: CAD.   Coronary CT 01/12/2020: Coronary calcium score 1355 (85th percentile for age and sex).  Diffuse mild plaque RCA.  Minimal calcified plaque proximal and mid LAD.  Moderate calcified plaque RI.  Minimal plaque left Cx.  LAA without thrombus.  Aortic atherosclerosis.  Recommend aggressive risk factor modification including LDL goal <70. Echo 12/19/2021: EF 60 to 65%.  Mild concentric LVH.  Grade I DD.  Mildly dilated left atrium.  Mild mitral valve regurgitation. Not on beta-blocker secondary to bradycardia and fatigue.  Intolerant to aspirin secondary to GI upset. Mild carotid stenosis.   Carotid duplex ultrasound 01/11/2020: Bilateral ICA 1 to 39% stenosis. Hypertension.   November 2023: Increase of amlodipine to 5 mg daily per new PCP at [facility] in [place].  Hyperlipidemia. Last lipid panel 03/01/2020: LDL 98, HDL 50, triglycerides 141, total cholesterol 173. T2DM.   Last A1C 11/20/2021: 7.3. Bipolar disorder.  Dwayne Vargas was first seen during a hospital admission for chest pain and hypertensive urgency in November 2021.  At that time he had stopped losartan secondary to a weight loss of 40 pounds.  This caused his SBP to  be > 200.  Coronary CT showed calcium score of 1355, aortic atherosclerosis, and mild to moderate disease in the RCA, LAD, RI, and left circumflex.  Carotid ultrasound revealed mild bilateral stenosis.  Losartan was restarted and amlodipine was added and he was instructed to follow-up as an outpatient.  He was not seen in follow-up until October 2023.  Prior to 12/05/2021 office visit with Dr. [facility] Salvia he was evaluated at [facility] emergency room in September 2023 for chest and shoulder pain.  Chest CT was negative for PE and he was treated for pneumonia.  Office visit on 12/05/2021 patient complained of continued left chest/breast pain that ranged from severely bothersome to intermittently mild.  Antibiotic course received at [facility] ED did not change pain.  Body position can affect severity of pain and hydrocodone has helped resolve pain.  He denied increased pain with exertion.  Echocardiogram was ordered revealing an EF of 60 to 65%, mild LVH, Grade I DD, and mild mitral valve regurgitation.  Today, patient reports he is doing well from a chest pain/shortness of breath standpoint.  He no longer has chest pain or shortness of breath.  He completed his course of antibiotic for recent pneumonia diagnosed at [facility] emergency room in October 2023.  He recently began a walking program. Denies DOE, lower extremity edema or palpitations. He attempts to follow a low-sodium/low sugar diet.   Patient complains of hypertension.  BP today 162/82 at intake and 158/80 at recheck.  At home blood pressure 150-180/80-100, very rarely 130s/80s. Recently seen at [facility] and amlodipine increased to 5 mg daily.  He reports a lot of stressors including a "nasty" divorce.  He has not been able to get into his psychiatrist to get his bipolar medications.  He does have flight of ideas but does not appear manic.  He also has multiple chronic pain complaints that disturb his sleep including back pain and bilateral lower extremity  pain.   Home Medications    Current Meds  Medication Sig   amLODipine (NORVASC) 5 MG tablet Take 5 mg by mouth daily.   colchicine 0.6 MG tablet Take 1 tablet (0.6 mg total) by mouth daily.   losartan (COZAAR) 100 MG tablet Take 100 mg by mouth daily.   Multiple Vitamin (MULTIVITAMIN WITH MINERALS) TABS tablet Take 1 tablet by mouth daily.   rosuvastatin (CRESTOR) 5 MG tablet Take 5 mg by mouth daily.   traZODone (DESYREL) 50 MG tablet Take 100 mg by mouth at bedtime.     Family History    Family History  Problem Relation Age of Onset   Diabetes Cousin    He indicated that the status of his cousin is unknown.   Social History    Social History   Socioeconomic History   Marital  status: Widowed    Spouse name: Not on file   Number of children: Not on file   Years of education: Not on file   Highest education level: Not on file  Occupational History   Not on file  Tobacco Use   Smoking status: Former    Packs/day: 1.00    Years: 53.00    Total pack years: 53.00    Types: Cigarettes    Quit date: 2019    Years since quitting: 4.8   Smokeless tobacco: Never  Vaping Use   Vaping Use: Never used  Substance and Sexual Activity   Alcohol use: No    Alcohol/week: 0.0 standard drinks of alcohol   Drug use: No   Sexual activity: Never  Other Topics Concern   Not on file  Social History Narrative   Not on file   Social Determinants of Health   Financial Resource Strain: Not on file  Food Insecurity: Not on file  Transportation Needs: Not on file  Physical Activity: Not on file  Stress: Not on file  Social Connections: Not on file  Intimate Partner Violence: Not on file     Review of Systems    General: No chills, fever, night sweats or weight changes. Positive for hypertension. Cardiovascular:  No chest pain, dyspnea on exertion, edema, orthopnea, palpitations, paroxysmal nocturnal dyspnea. Dermatological: No rash, lesions/masses Respiratory: No cough,  dyspnea Urologic: No hematuria, dysuria Abdominal:   No nausea, vomiting, diarrhea, bright red blood per rectum, melena, or hematemesis Neurologic:  No visual changes, weakness, changes in mental status. All other systems reviewed and are otherwise negative except as noted above.  Physical Exam    VS:  BP (!) 162/82   Pulse 78   Ht 5\' 11"  (1.803 m)   Wt 202 lb (91.6 kg)   BMI 28.17 kg/m  , BMI Body mass index is 28.17 kg/m. GEN: Well nourished, well developed, in no acute distress. HEENT: Normal. Neck: Supple, no JVD, carotid bruits, or masses. Cardiac: RRR, no murmurs, rubs, or gallops. No clubbing, cyanosis, edema.  Radials/DP/PT 2+ and equal bilaterally.  Respiratory:  Respirations regular and unlabored, clear to auscultation bilaterally. GI: Soft, nontender, nondistended. MS: No deformity or atrophy. Skin: Warm and dry, no rash. Neuro: Strength and sensation are intact. Psych: Excitable affect.  Accessory Clinical Findings    Recent Labs: 11/20/2021: BUN 24; Creatinine, Ser 0.99; Hemoglobin 14.5; Platelets 242; Potassium 3.4; Sodium 136   Recent Lipid Panel    Component Value Date/Time   CHOL 145 01/12/2020 0533   TRIG 95 01/12/2020 0533   HDL 47 01/12/2020 0533   CHOLHDL 3.1 01/12/2020 0533   VLDL 19 01/12/2020 0533   LDLCALC 79 01/12/2020 0533    HYPERTENSION CONTROL Vitals:   01/05/22 1101 01/05/22 1416  BP: (!) 162/82 (!) 158/80    The patient's blood pressure is elevated above target today.  In order to address the patient's elevated BP: A new medication was prescribed today.      ECG is not indicated today.   Assessment & Plan   Hypertension.  BP today 162/82 at intake, 158/80 at recheck.  Home BP 150-180s/80-100s, very rarely 130s/80s.  Amlodipine was increased 1 week ago.  Will stop losartan and start valsartan 320 mg daily.  BMP in 1 to 2 weeks. CAD/mild carotid stenosis.  Echo 12/19/2021 showed EF 60 to 65%, mild concentric LVH, Grade I DD mild  mitral valve regurgitation.  Carotid ultrasound 03/13/2019 showed mild stenosis bilateral ICA.  He denies dizziness, syncope, chest pain, shortness of breath, or DOE.  He does not take aspirin secondary to GI upset or a beta-blocker secondary to bradycardia.  Increase Crestor to 10 mg. Hyperlipidemia.  Coronary CT 01/12/2020 showed calcium score 1355 with moderate to minimal plaque in the RCA, LAD, RI, and Cx.  Last lipid panel 03/01/2020 showed LDL 98, not at target.  Increase rosuvastatin to 10 mg daily. Bipolar disorder.  Patient has been able to get into his psychiatrist to get his medications.  He plays flight of ideas and excitable affect today.  Provided patient with [facility] contact information.  He plans to contact them today for assistance with obtaining a new psychiatrist.  Disposition: Stop losartan and begin valsartan 320 mg daily.  Increase Crestor to 10 mg at daily.  BMP in 1 to 2 weeks.  Return in 6 to 8 weeks or sooner as needed.  Will obtain lipid panel and LFTs at return visit.   Etta Grandchild. Primus Gritton, NP-C     01/05/2022, 11:03 AM [facility] [address] Office [phone] Fax [phone]   I spent 12 minutes examining this patient, reviewing medications, and using patient centered shared decision making involving her cardiac care.  Prior to her visit I spent greater than 20 minutes reviewing her past medical history,  medications, and prior cardiac tests.

## 2022-01-05 ENCOUNTER — Encounter (HOSPITAL_BASED_OUTPATIENT_CLINIC_OR_DEPARTMENT_OTHER): Payer: Self-pay | Admitting: Student

## 2022-01-05 ENCOUNTER — Ambulatory Visit (INDEPENDENT_AMBULATORY_CARE_PROVIDER_SITE_OTHER): Payer: Medicare PPO | Admitting: Student

## 2022-01-05 VITALS — BP 158/80 | HR 78 | Ht 71.0 in | Wt 202.0 lb

## 2022-01-05 DIAGNOSIS — I10 Essential (primary) hypertension: Secondary | ICD-10-CM | POA: Diagnosis not present

## 2022-01-05 DIAGNOSIS — E785 Hyperlipidemia, unspecified: Secondary | ICD-10-CM

## 2022-01-05 DIAGNOSIS — I6523 Occlusion and stenosis of bilateral carotid arteries: Secondary | ICD-10-CM

## 2022-01-05 DIAGNOSIS — I25118 Atherosclerotic heart disease of native coronary artery with other forms of angina pectoris: Secondary | ICD-10-CM | POA: Diagnosis not present

## 2022-01-05 DIAGNOSIS — F319 Bipolar disorder, unspecified: Secondary | ICD-10-CM

## 2022-01-05 MED ORDER — ROSUVASTATIN CALCIUM 10 MG PO TABS: 10.0000 mg | ORAL_TABLET | Freq: Every day | ORAL | 3 refills | Status: AC

## 2022-01-05 MED ORDER — AMLODIPINE BESYLATE 5 MG PO TABS: 2.5000 mg | ORAL_TABLET | Freq: Every day | ORAL | 3 refills | Status: DC

## 2022-01-05 MED ORDER — VALSARTAN 320 MG PO TABS: 320.0000 mg | ORAL_TABLET | Freq: Every day | ORAL | 3 refills | Status: DC

## 2022-01-05 NOTE — Patient Instructions (Addendum)
Medication Instructions:  Your physician has recommended you make the following change in your medication:    INCREASE Crestor to 10mg  daily  STOP Losartan  START Valsartan 320mg  daily  CONTINUE Amlodipine 5mg  daily  *If you need a refill on your cardiac medications before your next appointment, please call your pharmacy*   Lab Work: Your physician recommends that you return for lab work in: 1-2 weeks for BMP  Please return for Lab work. You may come to the...   [facility] (3rd floor) [address], [place], [address]  Open: 8am-Noon and 1pm-4:30pm  Please ring the doorbell on the small table when you exit the elevator and the Lab Tech will come get you  [facility] [address], [place], [facility] [place] Open: 8am-1pm, then 2pm-4:30pm   [facility]- Please see attached locations sheet stapled to your lab work with address and hours.    If you have labs (blood work) drawn today and your tests are completely normal, you will receive your results only by: MyChart Message (if you have MyChart) OR A paper copy in the mail If you have any lab test that is abnormal or we need to change your treatment, we will call you to review the results.   Testing/Procedures: None ordered today.   Follow-Up: At [facility], you and your health needs are our priority.  As part of our continuing mission to provide you with exceptional heart care, we have created designated Provider Care Teams.  These Care Teams include your primary Cardiologist (physician) and Advanced Practice Providers (APPs -  Physician Assistants and Nurse Practitioners) who all work together to provide you with the care you need, when you need it.  We recommend signing up for the patient portal called "MyChart".  Sign up information is provided on this After Visit Summary.  MyChart is used to connect with patients for Virtual Visits  (Telemedicine).  Patients are able to view lab/test results, encounter notes, upcoming appointments, etc.  Non-urgent messages can be sent to your provider as well.   To learn more about what you can do with MyChart, go to [place].    Your next appointment:   6-8 week(s)  The format for your next appointment:   In Person  Provider:   [id number], MD or [facility], NP    Other Instructions  [facility]:  [phone]  Address: [address], [place], [phone]  Hours:Open 24/7, No appointment required.  Tips to Measure your Blood Pressure Correctly  Here's what you can do to ensure a correct reading:  Don't drink a caffeinated beverage or smoke during the 30 minutes before the test.  Sit quietly for five minutes before the test begins.  During the measurement, sit in a chair with your feet on the floor and your arm supported so your elbow is at about heart level.  The inflatable part of the cuff should completely cover at least 80% of your upper arm, and the cuff should be placed on bare skin, not over a shirt.  Don't talk during the measurement.   Blood pressure categories  Blood pressure category SYSTOLIC (upper number)  DIASTOLIC (lower number)  Normal Less than 120 mm Hg and Less than 80 mm Hg  Elevated 120-129 mm Hg and Less than 80 mm Hg  High blood pressure: Stage 1 hypertension 130-139 mm Hg or 80-89 mm Hg  High blood pressure: Stage  2 hypertension 140 mm Hg or higher or 90 mm Hg or higher  Hypertensive crisis (consult your doctor immediately) Higher than 180 mm Hg and/or Higher than 120 mm Hg  Source: American Heart Association and American Stroke Association. For more on getting your blood pressure under control, buy Controlling Your Blood Pressure, a Special Health Report from [facility].   Blood Pressure Log   Date   Time  Blood Pressure  Example: Nov 1 9 AM 124/78                                               Heart Healthy Diet Recommendations: A low-salt diet is recommended. Meats should be grilled, baked, or boiled. Avoid fried foods. Focus on lean protein sources like fish or chicken with vegetables and fruits. The American Heart Association is a Chief Technology Officer!  American Heart Association Diet and Lifeystyle Recommendations   Exercise recommendations: The American Heart Association recommends 150 minutes of moderate intensity exercise weekly. Try 30 minutes of moderate intensity exercise 4-5 times per week. This could include walking, jogging, or swimming.   Important Information About Sugar

## 2022-01-23 ENCOUNTER — Telehealth (HOSPITAL_BASED_OUTPATIENT_CLINIC_OR_DEPARTMENT_OTHER): Payer: Self-pay

## 2022-01-23 DIAGNOSIS — I10 Essential (primary) hypertension: Secondary | ICD-10-CM

## 2022-01-23 LAB — BASIC METABOLIC PANEL
BUN/Creatinine Ratio: 14 (ref 10–24)
BUN: 20 mg/dL (ref 8–27)
CO2: 18 mmol/L — ABNORMAL LOW (ref 20–29)
Calcium: 10.3 mg/dL — ABNORMAL HIGH (ref 8.6–10.2)
Chloride: 103 mmol/L (ref 96–106)
Creatinine, Ser: 1.39 mg/dL — ABNORMAL HIGH (ref 0.76–1.27)
Glucose: 187 mg/dL — ABNORMAL HIGH (ref 70–99)
Potassium: 5 mmol/L (ref 3.5–5.2)
Sodium: 140 mmol/L (ref 134–144)
eGFR: 52 mL/min/{1.73_m2} — ABNORMAL LOW (ref >59)

## 2022-01-23 NOTE — Telephone Encounter (Addendum)
Returned call to patient. Attempted to review the following recommendations with the patient. Patient states he is out looking at a nursing home and isn't sure what medications he is taking if any. Instructed patient to return our call at the office when he is at home with his pill bottles and his daughter.      ----- Message from Dwayne Levering, NP sent at 01/23/2022  8:08 AM EST ----- Please contact the patient to let him know his kidney function is somewhat reduced. I suggest cutting his Valsartan dose in half for the next two weeks and repeating BMP in two weeks.   Will you also ask patient if he is checking his BP at home and how it has been since changing medications? If he is not checking it please ask him to keep a log for the next two weeks to send/call in.   Thank you!

## 2022-01-25 NOTE — Telephone Encounter (Signed)
Left message for patient to call back  

## 2022-01-26 ENCOUNTER — Encounter (HOSPITAL_BASED_OUTPATIENT_CLINIC_OR_DEPARTMENT_OTHER): Payer: Self-pay

## 2022-01-26 NOTE — Addendum Note (Signed)
Addended by: Marlene Lard on: 01/26/2022 09:28 AM   Modules accepted: Orders

## 2022-01-26 NOTE — Telephone Encounter (Signed)
3rd call attempt to patient and daughter, no answer, unable to leave VM. Will send recommednations to patient via letter and ask him to call the office.

## 2022-02-13 ENCOUNTER — Ambulatory Visit (HOSPITAL_BASED_OUTPATIENT_CLINIC_OR_DEPARTMENT_OTHER): Payer: Medicare PPO | Admitting: Cardiovascular Disease

## 2022-03-04 ENCOUNTER — Emergency Department (HOSPITAL_BASED_OUTPATIENT_CLINIC_OR_DEPARTMENT_OTHER): Payer: Medicare PPO

## 2022-03-04 ENCOUNTER — Emergency Department (HOSPITAL_BASED_OUTPATIENT_CLINIC_OR_DEPARTMENT_OTHER)
Admission: EM | Admit: 2022-03-04 | Discharge: 2022-03-05 | Disposition: A | Discharge status: Home / Self Care | Payer: Medicare PPO | Attending: Emergency Medicine | Admitting: Emergency Medicine

## 2022-03-04 DIAGNOSIS — N3000 Acute cystitis without hematuria: Secondary | ICD-10-CM | POA: Insufficient documentation

## 2022-03-04 DIAGNOSIS — R42 Dizziness and giddiness: Secondary | ICD-10-CM | POA: Insufficient documentation

## 2022-03-04 DIAGNOSIS — R791 Abnormal coagulation profile: Secondary | ICD-10-CM | POA: Insufficient documentation

## 2022-03-04 DIAGNOSIS — Z20822 Contact with and (suspected) exposure to covid-19: Secondary | ICD-10-CM | POA: Diagnosis not present

## 2022-03-04 LAB — CBC WITH DIFFERENTIAL/PLATELET
Abs Immature Granulocytes: 0.05 10*3/uL (ref 0.00–0.07)
Basophils Absolute: 0.1 10*3/uL (ref 0.0–0.1)
Basophils Relative: 1 %
Eosinophils Absolute: 0.3 10*3/uL (ref 0.0–0.5)
Eosinophils Relative: 4 %
HCT: 39.4 % (ref 39.0–52.0)
Hemoglobin: 13 g/dL (ref 13.0–17.0)
Immature Granulocytes: 1 %
Lymphocytes Relative: 22 %
Lymphs Abs: 1.7 10*3/uL (ref 0.7–4.0)
MCH: 28.7 pg (ref 26.0–34.0)
MCHC: 33 g/dL (ref 30.0–36.0)
MCV: 87 fL (ref 80.0–100.0)
Monocytes Absolute: 0.6 10*3/uL (ref 0.1–1.0)
Monocytes Relative: 8 %
Neutro Abs: 5 10*3/uL (ref 1.7–7.7)
Neutrophils Relative %: 64 %
Platelets: 249 10*3/uL (ref 150–400)
RBC: 4.53 MIL/uL (ref 4.22–5.81)
RDW: 13.5 % (ref 11.5–15.5)
WBC: 7.8 10*3/uL (ref 4.0–10.5)
nRBC: 0 % (ref 0.0–0.2)

## 2022-03-04 LAB — URINALYSIS, ROUTINE W REFLEX MICROSCOPIC
Bilirubin Urine: NEGATIVE
Glucose, UA: >1000 mg/dL — AB
Hgb urine dipstick: NEGATIVE
Ketones, ur: NEGATIVE mg/dL
Nitrite: NEGATIVE
Specific Gravity, Urine: 1.014 (ref 1.005–1.030)
WBC, UA: >50 WBC/hpf — ABNORMAL HIGH (ref 0–5)
pH: 6.5 (ref 5.0–8.0)

## 2022-03-04 LAB — COMPREHENSIVE METABOLIC PANEL
ALT: 9 U/L (ref 0–44)
AST: 11 U/L — ABNORMAL LOW (ref 15–41)
Albumin: 4.6 g/dL (ref 3.5–5.0)
Alkaline Phosphatase: 64 U/L (ref 38–126)
Anion gap: 10 (ref 5–15)
BUN: 21 mg/dL (ref 8–23)
CO2: 25 mmol/L (ref 22–32)
Calcium: 10.7 mg/dL — ABNORMAL HIGH (ref 8.9–10.3)
Chloride: 102 mmol/L (ref 98–111)
Creatinine, Ser: 1.31 mg/dL — ABNORMAL HIGH (ref 0.61–1.24)
GFR, Estimated: 56 mL/min — ABNORMAL LOW (ref >60)
Glucose, Bld: 110 mg/dL — ABNORMAL HIGH (ref 70–99)
Potassium: 4.1 mmol/L (ref 3.5–5.1)
Sodium: 137 mmol/L (ref 135–145)
Total Bilirubin: 0.3 mg/dL (ref 0.3–1.2)
Total Protein: 7.3 g/dL (ref 6.5–8.1)

## 2022-03-04 LAB — RESP PANEL BY RT-PCR (RSV, FLU A&B, COVID)  RVPGX2
Influenza A by PCR: NEGATIVE
Influenza B by PCR: NEGATIVE
Resp Syncytial Virus by PCR: NEGATIVE
SARS Coronavirus 2 by RT PCR: NEGATIVE

## 2022-03-04 LAB — PROTIME-INR
INR: 1.1 (ref 0.8–1.2)
Prothrombin Time: 13.6 seconds (ref 11.4–15.2)

## 2022-03-04 LAB — APTT: aPTT: 29 seconds (ref 24–36)

## 2022-03-04 LAB — CBG MONITORING, ED: Glucose-Capillary: 106 mg/dL — ABNORMAL HIGH (ref 70–99)

## 2022-03-04 MED ORDER — MECLIZINE HCL 25 MG PO TABS
25.0000 mg | ORAL_TABLET | Freq: Once | ORAL | Status: AC
  Administered 2022-03-04: 25 mg via ORAL
  Filled 2022-03-04: qty 1

## 2022-03-04 MED ORDER — SODIUM CHLORIDE 0.9 % IV SOLN
1.0000 g | Freq: Once | INTRAVENOUS | Status: AC
  Administered 2022-03-05: 1 g via INTRAVENOUS
  Filled 2022-03-04: qty 10

## 2022-03-04 NOTE — ED Provider Notes (Addendum)
[facility] EMERGENCY DEPT Provider Note   CSN: [id number] Arrival date & time: 03/04/22  2032     History  Chief Complaint  Patient presents with   Weakness    Dwayne Vargas is a 77 y.o. male.  HPI Patient reports that 630 he had a sudden onset of intense dizziness.  He had eaten dinner between 5 and 6 and felt well.  He was asymptomatic.  He lay down on the couch watching television.  He reports he got up and he had intense dizziness.  He reports he felt like he could not balance and things were moving back-and-forth.  He felt like he could not focus his vision and any movement made it worse.  He reports he tried to walk and his legs were feeling weak.  He reports that he felt that he was going to stumble and was using support to walk.  He was able to phone his daughter who came to assist.  He reports that a couple of other people had to help him to be able to walk because he was off balance.  He came to the emergency department.  He reports symptoms have now resolved.  He does not feel that intense dizziness or weakness.  He denies he had a headache at the time.  Patient denies any prior history of vertigo.  He is not have history of headaches or stroke.  Patient has history of self cath for prostatic enlargement.  He is due for a urologic surgery in the next week.  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    Home Medications Prior to Admission medications   Medication Sig Start Date End Date Taking? Authorizing Provider  amLODipine (NORVASC) 5 MG tablet Take 0.5 tablets (2.5 mg total) by mouth daily. Patient taking differently: Take 5 mg by mouth daily. 01/05/22   Mayra Reel, NP  colchicine 0.6 MG tablet Take 1 tablet (0.6 mg total) by mouth daily. 12/05/21   Skeet Latch, MD  DUPIXENT 300 MG/2ML SOPN Inject into the skin every 14 (fourteen) days. Patient not taking: Reported on 01/05/2022 11/16/21   [provider]  fluticasone  (FLONASE) 50 MCG/ACT nasal spray Place 1-2 sprays into both nostrils daily as needed for allergies or rhinitis. Patient not taking: Reported on 01/05/2022    [provider]  HYDROcodone-acetaminophen (NORCO/VICODIN) 5-325 MG tablet Take 1 tablet by mouth 4 (four) times daily as needed. Patient not taking: Reported on 01/05/2022 12/01/21   [provider]  hydrOXYzine (ATARAX) 25 MG tablet Take 1 tablet (25 mg total) by mouth every 6 (six) hours as needed for itching. Patient not taking: Reported on 12/05/2021 11/11/21   Tacy Learn, PA-C  lamoTRIgine (LAMICTAL) 100 MG tablet Take 100 mg by mouth at bedtime. Patient not taking: Reported on 01/05/2022 11/24/21   [provider]  Multiple Vitamin (MULTIVITAMIN WITH MINERALS) TABS tablet Take 1 tablet by mouth daily.    [provider]  omeprazole (PRILOSEC) 20 MG capsule Take 20 mg by mouth daily. Patient not taking: Reported on 01/05/2022    [provider]  permethrin (ELIMITE) 5 % cream Apply to affected area once Patient not taking: Reported on 01/05/2022 11/11/21   Suella Broad A, PA-C  rosuvastatin (CRESTOR) 10 MG tablet Take 1 tablet (10 mg total) by mouth daily. 01/05/22   Mayra Reel, NP  traZODone (DESYREL) 50 MG tablet Take 100 mg by mouth at bedtime.     [provider]  valsartan (DIOVAN) 320 MG tablet Take 1 tablet (320 mg total) by mouth daily. 01/05/22   Carlos Levering, NP      Allergies    Aspirin, Contrast media [iodinated contrast media], Fentanyl, Morphine and related, Oxycontin [oxycodone hcl], Shellfish allergy, and Diazepam    Review of Systems   Review of Systems  Physical Exam Updated Vital Signs BP (!) 167/86 (BP Location: Right Arm)   Pulse 80   Temp 98.4 F (36.9 C) (Oral)   Resp 20   Wt 86.2 kg   SpO2 99%   BMI 26.50 kg/m  Physical Exam Constitutional:      Comments: Patient is alert.  Well-nourished well-developed.  Good physical  condition for age.  No respiratory distress.  HENT:     Head: Normocephalic and atraumatic.     Right Ear: Tympanic membrane normal.     Left Ear: Tympanic membrane normal.     Mouth/Throat:     Mouth: Mucous membranes are moist.     Pharynx: Oropharynx is clear.  Eyes:     Extraocular Movements: Extraocular movements intact.     Conjunctiva/sclera: Conjunctivae normal.     Pupils: Pupils are equal, round, and reactive to light.  Cardiovascular:     Rate and Rhythm: Normal rate and regular rhythm.  Pulmonary:     Effort: Pulmonary effort is normal.     Breath sounds: Normal breath sounds.  Abdominal:     General: There is no distension.     Palpations: Abdomen is soft.     Tenderness: There is no abdominal tenderness. There is no guarding.  Musculoskeletal:        General: No swelling or tenderness. Normal range of motion.     Cervical back: Neck supple.     Right lower leg: No edema.     Left lower leg: No edema.  Skin:    General: Skin is warm and dry.  Neurological:     General: No focal deficit present.     Mental Status: He is oriented to person, place, and time.     Cranial Nerves: No cranial nerve deficit.     Motor: No weakness.     Coordination: Coordination normal.     Comments: Normal finger-nose exam bilaterally.  Symmetric lower extremity strength.  Qualifiers the patient reports his right knee is bad and he is slightly less strong but this is baseline.  Psychiatric:        Mood and Affect: Mood normal.     ED Results / Procedures / Treatments   Labs (all labs ordered are listed, but only abnormal results are displayed) Labs Reviewed  COMPREHENSIVE METABOLIC PANEL - Abnormal; Notable for the following components:      Result Value   Glucose, Bld 110 (*)    Creatinine, Ser 1.31 (*)    Calcium 10.7 (*)    AST 11 (*)    GFR, Estimated 56 (*)    All other components within normal limits  URINALYSIS, ROUTINE W REFLEX MICROSCOPIC - Abnormal; Notable for the  following components:   Color, Urine COLORLESS (*)    Glucose, UA >1,000 (*)    Protein, ur TRACE (*)    Leukocytes,Ua SMALL (*)    WBC, UA >50 (*)    Bacteria, UA RARE (*)    All other components within normal limits  CBG MONITORING, ED - Abnormal; Notable for the following components:   Glucose-Capillary 106 (*)    All other components within  normal limits  RESP PANEL BY RT-PCR (RSV, FLU A&B, COVID)  RVPGX2  URINE CULTURE  CBC WITH DIFFERENTIAL/PLATELET  PROTIME-INR  APTT    EKG EKG Interpretation  Date/Time:  Sunday March 04 2022 20:43:42 EST Ventricular Rate:  77 PR Interval:  190 QRS Duration: 110 QT Interval:  363 QTC Calculation: 411 R Axis:   43 Text Interpretation: Sinus rhythm Abnormal R-wave progression, early transition Abnormal T, consider ischemia, anterior leads isolated PVC otherwise no sig change from previous Confirmed by Arby Barrette [phone]) on 03/04/2022 8:46:38 PM  Radiology CT Head Wo Contrast  Result Date: 03/04/2022 CLINICAL DATA:  Neuro deficit, acute, stroke suspected. Dizziness and weakness for 2 hours. EXAM: CT HEAD WITHOUT CONTRAST TECHNIQUE: Contiguous axial images were obtained from the base of the skull through the vertex without intravenous contrast. RADIATION DOSE REDUCTION: This exam was performed according to the departmental dose-optimization program which includes automated exposure control, adjustment of the mA and/or kV according to patient size and/or use of iterative reconstruction technique. COMPARISON:  08/12/2014. FINDINGS: Brain: No acute intracranial hemorrhage, midline shift or mass effect. No extra-axial fluid collection. Extensive periventricular white matter hypodensities are noted bilaterally. No hydrocephalus. Vascular: No hyperdense vessel or unexpected calcification. Skull: Normal. Negative for fracture or focal lesion. Sinuses/Orbits: Mucosal thickening in the left maxillary sinus. Other: None. IMPRESSION: 1. No acute  intracranial process. 2. Chronic microvascular ischemic changes. Electronically Signed   By: Thornell Sartorius M.D.   On: 03/04/2022 23:34    Procedures Procedures    Medications Ordered in ED Medications  cefTRIAXone (ROCEPHIN) 1 g in sodium chloride 0.9 % 100 mL IVPB (1 g Intravenous New Bag/Given 03/05/22 0010)  meclizine (ANTIVERT) tablet 25 mg (25 mg Oral Given 03/04/22 2301)    ED Course/ Medical Decision Making/ A&P                           Medical Decision Making Amount and/or Complexity of Data Reviewed Labs: ordered. Radiology: ordered.  Risk Prescription drug management.   Patient had an acute onset of vertiginous quality dizziness.  He felt weak but it does not seem to localize.  Symptoms lasted about 2 hours.  At the time of evaluation symptoms had resolved.  No intervention has been done.  Patient does not have prior history of vertigo.  Differential diagnosis includes peripheral vertigo\vertebrobasilar insufficiency\stroke.  Will proceed with CT head and stroke workup.  Symptoms had significantly vertiginous component.  Will give 1 dose of meclizine orally.  Labs were obtained normal.  Patient does have greater than 50 WBCs in the urine.  With a good specimen.  Will opt to treat given the patient does self cath.  CT head returned with radiology interpretation no acute findings microvascular disease present.  With patient's age and risk for CVA will proceed with MRI to rule out cerebellar stroke.  Patient has no prior history of vertigo.  At this time his exam is normal.  Patient is not candidate for thrombolytics and does not have code stroke criteria.  Will plan for transfer to [facility] emergency department for MRI.  If MRI is negative for acute stroke, I feel patient is stable for discharge with diagnosis of peripheral vertigo and UTI.  Dr. Bebe Shaggy accepts for ED to ED transfer.  I had discussed this with the patient.  He is agreeable to going to [facility] emergency  department for MRI.  He is aware that he may have to  wait in the waiting room for his MRI.     Final Clinical Impression(s) / ED Diagnoses Final diagnoses:  Vertigo  Acute cystitis without hematuria    Rx / DC Orders ED Discharge Orders     None         Arby Barrette, MD 03/05/22 9983    Arby Barrette, MD 03/05/22 0030

## 2022-03-04 NOTE — ED Provider Notes (Incomplete)
[facility] EMERGENCY DEPT Provider Note   CSN: [id number] Arrival date & time: 03/04/22  2032     History {Add pertinent medical, surgical, social history, OB history to HPI:1} Chief Complaint  Patient presents with  . Weakness    Dwayne Vargas is a 77 y.o. male.  HPI Patient reports that 630 he had a sudden onset of intense dizziness.  He had eaten dinner between 5 and 6 and felt well.  He was asymptomatic.  He lay down on the couch watching television.  He reports he got up and he had intense dizziness.  He reports he felt like he could not balance and things were moving back-and-forth.  He reports he tried to walk and her legs were feeling    Home Medications Prior to Admission medications   Medication Sig Start Date End Date Taking? Authorizing Provider  amLODipine (NORVASC) 5 MG tablet Take 0.5 tablets (2.5 mg total) by mouth daily. Patient taking differently: Take 5 mg by mouth daily. 01/05/22   Mayra Reel, NP  colchicine 0.6 MG tablet Take 1 tablet (0.6 mg total) by mouth daily. 12/05/21   Skeet Latch, MD  DUPIXENT 300 MG/2ML SOPN Inject into the skin every 14 (fourteen) days. Patient not taking: Reported on 01/05/2022 11/16/21   [provider]  fluticasone (FLONASE) 50 MCG/ACT nasal spray Place 1-2 sprays into both nostrils daily as needed for allergies or rhinitis. Patient not taking: Reported on 01/05/2022    [provider]  HYDROcodone-acetaminophen (NORCO/VICODIN) 5-325 MG tablet Take 1 tablet by mouth 4 (four) times daily as needed. Patient not taking: Reported on 01/05/2022 12/01/21   [provider]  hydrOXYzine (ATARAX) 25 MG tablet Take 1 tablet (25 mg total) by mouth every 6 (six) hours as needed for itching. Patient not taking: Reported on 12/05/2021 11/11/21   Tacy Learn, PA-C  lamoTRIgine (LAMICTAL) 100 MG tablet Take 100 mg by mouth at bedtime. Patient not taking: Reported on 01/05/2022 11/24/21    [provider]  Multiple Vitamin (MULTIVITAMIN WITH MINERALS) TABS tablet Take 1 tablet by mouth daily.    [provider]  omeprazole (PRILOSEC) 20 MG capsule Take 20 mg by mouth daily. Patient not taking: Reported on 01/05/2022    [provider]  permethrin (ELIMITE) 5 % cream Apply to affected area once Patient not taking: Reported on 01/05/2022 11/11/21   Suella Broad A, PA-C  rosuvastatin (CRESTOR) 10 MG tablet Take 1 tablet (10 mg total) by mouth daily. 01/05/22   Mayra Reel, NP  traZODone (DESYREL) 50 MG tablet Take 100 mg by mouth at bedtime.     [provider]  valsartan (DIOVAN) 320 MG tablet Take 1 tablet (320 mg total) by mouth daily. 01/05/22   Mayra Reel, NP      Allergies    Aspirin, Contrast media [iodinated contrast media], Fentanyl, Morphine and related, Oxycontin [oxycodone hcl], Shellfish allergy, and Diazepam    Review of Systems   Review of Systems  Physical Exam Updated Vital Signs BP (!) 190/76 (BP Location: Right Arm)   Temp 97.7 F (36.5 C) (Oral)   Resp 20   Wt 86.2 kg   SpO2 100%   BMI 26.50 kg/m  Physical Exam  ED Results / Procedures / Treatments   Labs (all labs ordered are listed, but only abnormal results are displayed) Labs Reviewed  CBG MONITORING, ED - Abnormal; Notable for the following components:      Result Value   Glucose-Capillary 106 (*)  All other components within normal limits  RESP PANEL BY RT-PCR (RSV, FLU A&B, COVID)  RVPGX2  COMPREHENSIVE METABOLIC PANEL  CBC WITH DIFFERENTIAL/PLATELET    EKG EKG Interpretation  Date/Time:  Sunday March 04 2022 20:43:42 EST Ventricular Rate:  77 PR Interval:  190 QRS Duration: 110 QT Interval:  363 QTC Calculation: 411 R Axis:   43 Text Interpretation: Sinus rhythm Abnormal R-wave progression, early transition Abnormal T, consider ischemia, anterior leads isolated PVC otherwise no sig change from previous Confirmed by  Arby Barrette [phone]) on 03/04/2022 8:46:38 PM  Radiology No results found.  Procedures Procedures  {Document cardiac monitor, telemetry assessment procedure when appropriate:1}  Medications Ordered in ED Medications - No data to display  ED Course/ Medical Decision Making/ A&P                           Medical Decision Making Amount and/or Complexity of Data Reviewed Labs: ordered.   ***  {Document critical care time when appropriate:1} {Document review of labs and clinical decision tools ie heart score, Chads2Vasc2 etc:1}  {Document your independent review of radiology images, and any outside records:1} {Document your discussion with family members, caretakers, and with consultants:1} {Document social determinants of health affecting pt's care:1} {Document your decision making why or why not admission, treatments were needed:1} Final Clinical Impression(s) / ED Diagnoses Final diagnoses:  None    Rx / DC Orders ED Discharge Orders     None

## 2022-03-04 NOTE — ED Notes (Signed)
Pt to exam 13 via wheelchair c/o dizziness and weakness x 2 hours. Pt states his head is "spinning". PT presents with NO unilateral deficits, NIH 0, Pt denies fall or injury. Pt states he is going through "bad divorce" and feels the stress is causing his symptoms. Pt a/o x 4 skin warm dry intact. VSS NAD IV established, glucose 106, EKG completed, labs drawn, monitor applied.

## 2022-03-05 ENCOUNTER — Emergency Department (HOSPITAL_COMMUNITY): Payer: Medicare PPO

## 2022-03-05 DIAGNOSIS — Z20822 Contact with and (suspected) exposure to covid-19: Secondary | ICD-10-CM | POA: Diagnosis not present

## 2022-03-05 DIAGNOSIS — N3000 Acute cystitis without hematuria: Secondary | ICD-10-CM | POA: Diagnosis not present

## 2022-03-05 DIAGNOSIS — R42 Dizziness and giddiness: Secondary | ICD-10-CM | POA: Diagnosis present

## 2022-03-05 DIAGNOSIS — R791 Abnormal coagulation profile: Secondary | ICD-10-CM | POA: Diagnosis not present

## 2022-03-05 MED ORDER — CEPHALEXIN 500 MG PO CAPS: 500.0000 mg | ORAL_CAPSULE | Freq: Four times a day (QID) | ORAL | 0 refills | Status: DC

## 2022-03-05 MED ORDER — MECLIZINE HCL 25 MG PO TABS: 25.0000 mg | ORAL_TABLET | Freq: Three times a day (TID) | ORAL | 0 refills | Status: DC | PRN

## 2022-03-05 NOTE — ED Notes (Signed)
MRI called to see patient status at this time.

## 2022-03-05 NOTE — ED Triage Notes (Signed)
Patient bib care [facility] as a transfer from [place]. He was transferred for a MRI.

## 2022-03-05 NOTE — ED Notes (Signed)
Patient ambulatory to the lobby where he states he will wait for his daughter to pick him up. She is coming from [place]

## 2022-03-05 NOTE — ED Provider Notes (Signed)
MRI negative. Patient has been ambulatory in the ER.  He will be discharged home   Ripley Fraise, MD 03/05/22 [phone]

## 2022-03-05 NOTE — ED Provider Notes (Signed)
Patient presents from [facility] for MRI.  Patient had severe vertigo that has since improved.  He was also found to have a UTI. Plan for MRI brain.  If negative he can be discharged home   Ripley Fraise, MD 03/05/22 [phone]

## 2022-03-05 NOTE — ED Notes (Signed)
Pt report called to [facility] Judy RN @ [facility], verbalized complete understanding of Pt plan of care and current condition denies questions at this time. [facility] to transport.

## 2022-03-07 LAB — URINE CULTURE: Culture: >=100000 — AB

## 2022-03-08 ENCOUNTER — Telehealth (HOSPITAL_BASED_OUTPATIENT_CLINIC_OR_DEPARTMENT_OTHER): Payer: Self-pay | Admitting: *Deleted

## 2022-03-08 NOTE — Telephone Encounter (Signed)
Post ED Visit - Positive Culture Follow-up  Culture report reviewed by antimicrobial stewardship pharmacist: [place] Team []  Elenor Quinones, Pharm.D. []  Heide Guile, Pharm.D., BCPS AQ-ID []  Parks Neptune, Pharm.D., BCPS []  Alycia Rossetti, Pharm.D., BCPS []  [place], [place].D., BCPS, AAHIVP []  Legrand Como, Pharm.D., BCPS, AAHIVP []  Salome Arnt, PharmD, BCPS []  Johnnette Gourd, PharmD, BCPS []  Hughes Better, PharmD, BCPS []  Leeroy Cha, PharmD []  Laqueta Linden, PharmD, BCPS []  Albertina Parr, PharmD  [place] Team []  Leodis Sias, PharmD []  Lindell Spar, PharmD []  Royetta Asal, PharmD []  Graylin Shiver, Rph []  Rema Fendt) Glennon Mac, PharmD []  Arlyn Dunning, PharmD []  Netta Cedars, PharmD []  Dia Sitter, PharmD []  Leone Haven, PharmD []  Gretta Arab, PharmD []  Theodis Shove, PharmD []  Peggyann Juba, PharmD []  Reuel Boom, PharmD   Positive urine culture Treated with Cephalexin, organism sensitive to the same and no further patient follow-up is required at this time.  Luisa Hart, Pharm D  Harlon [place] 03/08/2022, 1:01 PM

## 2022-03-11 ENCOUNTER — Emergency Department (HOSPITAL_COMMUNITY): Payer: Medicare PPO

## 2022-03-11 ENCOUNTER — Emergency Department (HOSPITAL_COMMUNITY)
Admission: EM | Admit: 2022-03-11 | Discharge: 2022-03-11 | Disposition: A | Discharge status: Home / Self Care | Payer: Medicare PPO | Attending: Emergency Medicine | Admitting: Emergency Medicine

## 2022-03-11 ENCOUNTER — Other Ambulatory Visit: Payer: Self-pay

## 2022-03-11 ENCOUNTER — Encounter (HOSPITAL_COMMUNITY): Payer: Self-pay

## 2022-03-11 DIAGNOSIS — E119 Type 2 diabetes mellitus without complications: Secondary | ICD-10-CM | POA: Diagnosis not present

## 2022-03-11 DIAGNOSIS — M25552 Pain in left hip: Secondary | ICD-10-CM | POA: Insufficient documentation

## 2022-03-11 DIAGNOSIS — W19XXXA Unspecified fall, initial encounter: Secondary | ICD-10-CM | POA: Diagnosis not present

## 2022-03-11 DIAGNOSIS — Y92009 Unspecified place in unspecified non-institutional (private) residence as the place of occurrence of the external cause: Secondary | ICD-10-CM | POA: Diagnosis not present

## 2022-03-11 DIAGNOSIS — I10 Essential (primary) hypertension: Secondary | ICD-10-CM | POA: Diagnosis not present

## 2022-03-11 DIAGNOSIS — R42 Dizziness and giddiness: Secondary | ICD-10-CM | POA: Insufficient documentation

## 2022-03-11 DIAGNOSIS — Z79899 Other long term (current) drug therapy: Secondary | ICD-10-CM | POA: Diagnosis not present

## 2022-03-11 LAB — CBC WITH DIFFERENTIAL/PLATELET
Abs Immature Granulocytes: 0.06 10*3/uL (ref 0.00–0.07)
Basophils Absolute: 0.1 10*3/uL (ref 0.0–0.1)
Basophils Relative: 1 %
Eosinophils Absolute: 0.3 10*3/uL (ref 0.0–0.5)
Eosinophils Relative: 3 %
HCT: 37.4 % — ABNORMAL LOW (ref 39.0–52.0)
Hemoglobin: 12.1 g/dL — ABNORMAL LOW (ref 13.0–17.0)
Immature Granulocytes: 1 %
Lymphocytes Relative: 14 %
Lymphs Abs: 1.2 10*3/uL (ref 0.7–4.0)
MCH: 28.5 pg (ref 26.0–34.0)
MCHC: 32.4 g/dL (ref 30.0–36.0)
MCV: 88 fL (ref 80.0–100.0)
Monocytes Absolute: 0.6 10*3/uL (ref 0.1–1.0)
Monocytes Relative: 7 %
Neutro Abs: 6.8 10*3/uL (ref 1.7–7.7)
Neutrophils Relative %: 74 %
Platelets: 219 10*3/uL (ref 150–400)
RBC: 4.25 MIL/uL (ref 4.22–5.81)
RDW: 13 % (ref 11.5–15.5)
WBC: 9.1 10*3/uL (ref 4.0–10.5)
nRBC: 0 % (ref 0.0–0.2)

## 2022-03-11 LAB — URINALYSIS, ROUTINE W REFLEX MICROSCOPIC
Bacteria, UA: NONE SEEN
Bilirubin Urine: NEGATIVE
Glucose, UA: >=500 mg/dL — AB
Hgb urine dipstick: NEGATIVE
Ketones, ur: NEGATIVE mg/dL
Leukocytes,Ua: NEGATIVE
Nitrite: NEGATIVE
Protein, ur: NEGATIVE mg/dL
Specific Gravity, Urine: 1.013 (ref 1.005–1.030)
pH: 5 (ref 5.0–8.0)

## 2022-03-11 LAB — BASIC METABOLIC PANEL
Anion gap: 9 (ref 5–15)
BUN: 19 mg/dL (ref 8–23)
CO2: 22 mmol/L (ref 22–32)
Calcium: 9.9 mg/dL (ref 8.9–10.3)
Chloride: 104 mmol/L (ref 98–111)
Creatinine, Ser: 1.49 mg/dL — ABNORMAL HIGH (ref 0.61–1.24)
GFR, Estimated: 48 mL/min — ABNORMAL LOW (ref >60)
Glucose, Bld: 107 mg/dL — ABNORMAL HIGH (ref 70–99)
Potassium: 4.1 mmol/L (ref 3.5–5.1)
Sodium: 135 mmol/L (ref 135–145)

## 2022-03-11 MED ORDER — MECLIZINE HCL 25 MG PO TABS
25.0000 mg | ORAL_TABLET | Freq: Once | ORAL | Status: AC
  Administered 2022-03-11: 25 mg via ORAL
  Filled 2022-03-11: qty 1

## 2022-03-11 MED ORDER — SODIUM CHLORIDE 0.9 % IV BOLUS
500.0000 mL | Freq: Once | INTRAVENOUS | Status: AC
  Administered 2022-03-11: 500 mL via INTRAVENOUS

## 2022-03-11 MED ORDER — ACETAMINOPHEN 325 MG PO TABS
650.0000 mg | ORAL_TABLET | Freq: Once | ORAL | Status: AC
  Administered 2022-03-11: 650 mg via ORAL
  Filled 2022-03-11: qty 2

## 2022-03-11 NOTE — Discharge Instructions (Addendum)
Your labs and workup were overall reassuring today.  This includes repeat MRI of the brain which did not show any signs of stroke.  Your x-rays did not show any fractures but there is a screw that appears to be out of place on your lower back x-ray.  If you have persistent back pain, you should see your orthopedist to have this evaluated.  Please use a rolling walker as we discussed to help with mobility and prevent falls.  It is a good idea to follow-up with your doctor to discuss medication concerns in case this is contributing to your imbalance.

## 2022-03-11 NOTE — ED Notes (Signed)
Unable to ambulate patient due to pt being unsteady,PA Humes aware.

## 2022-03-11 NOTE — ED Triage Notes (Signed)
Bib [facility] for fall at home. Fell in bathroom due to his vertigo. Pt unaware of he hit his head or loss consciousness . Does have a bruise under left eye and swelling, pt also c/o left hip pain and low back pain no deformities. Pt able to ambulate with assistance. No blood thinners.  Ems vitals 118/62 66 hr 98% RA Cbg 107

## 2022-03-11 NOTE — ED Notes (Signed)
Pt provided discharge instructions and prescription information. Pt was given the opportunity to ask questions and questions were answered.   

## 2022-03-11 NOTE — Evaluation (Signed)
Physical Therapy Evaluation Patient Details Name: Dwayne Vargas MRN: [id number] DOB: 12/02/1945 Today's Date: 03/11/2022  History of Present Illness  Pt is a 77 y.o. male who presented to the ED 03/10/22 after 3x falls at home. Of note, recent ED visit with UTI. High suspicion for BBPV, persistent vertigo x 1 week with negative brain MRI at symptom onset. PMHx: bipolar 1 disorder, HTN, DM, and anxiety   Clinical Impression  Pt presents with an overall decrease in functional mobility secondary to above. PTA, pt reports typically independent without DME, though has had increased falls over past 6-9 months where legs "just give out" and c/o vertigo symptoms; pt lives with wife and preparing to have "major surgery" on 03/22/22 that he needs to be home to rest up for. Today, pt's stability much improved with RW, ambulatory with min guard and intermittent cues for safety. Pt with no apparent nystagmus during mobility and head movements this session, 1x c/o dizziness when looking down; BP stable. Pt reports family able to provide necessary assist upon return home. Pt would benefit from continued acute PT services to maximize functional mobility and independence prior to d/c with HHPT services.      Recommendations for follow up therapy are one component of a multi-disciplinary discharge planning process, led by the attending physician.  Recommendations may be updated based on patient status, additional functional criteria and insurance authorization.  Follow Up Recommendations Home health PT      Assistance Recommended at Discharge Frequent or constant Supervision/Assistance  Patient can return home with the following  A little help with walking and/or transfers;A little help with bathing/dressing/bathroom;Assistance with cooking/housework;Assist for transportation;Help with stairs or ramp for entrance;Direct supervision/assist for medications management;Direct supervision/assist for financial management     Equipment Recommendations Rolling walker (2 wheels)  Recommendations for Other Services       Functional Status Assessment Patient has had a recent decline in their functional status and demonstrates the ability to make significant improvements in function in a reasonable and predictable amount of time.     Precautions / Restrictions Precautions Precautions: Fall;Other (comment) Precaution Comments: pt c/o vertigo and knee buckling that "randomly happens", no nystagmus or knee instability this session Restrictions Weight Bearing Restrictions: No      Mobility  Bed Mobility Overal bed mobility: Modified Independent Bed Mobility: Supine to Sit, Sit to Supine           General bed mobility comments: pt warned of potential vertigo but no episode this session    Transfers Overall transfer level: Needs assistance Equipment used: None, Rolling walker (2 wheels) Transfers: Sit to/from Stand Sit to Stand: Min assist, Min guard, +2 safety/equipment           General transfer comment: pt requires encouragement to attempt standing, adamant about having 2 people to assist and not being able to do it. able to sit<>stand from stretcher without DME, minA for HHA to stabilize; additional sit<>stand from chair to RW, min guard for balance    Ambulation/Gait Ambulation/Gait assistance: Min assist, Min guard Gait Distance (Feet): 180 Feet Assistive device: 2 person hand held assist, Rolling walker (2 wheels) Gait Pattern/deviations: Step-through pattern, Decreased stride length, Trunk flexed Gait velocity: Decreased     General Gait Details: slow, unsteady, guarded gait with initial HHA thten reaching to bilateral HHA to stabilize; additional gait training with RW, stability and stride length much improved, intermittent cues for safety with RW management, min guard  Stairs  Wheelchair Mobility    Modified Rankin (Stroke Patients Only)       Balance    Sitting-balance support: Feet supported Sitting balance-Leahy Scale: Good     Standing balance support: Single extremity supported, During functional activity, No upper extremity supported Standing balance-Leahy Scale: Fair Standing balance comment: able to stand to urinate and groom; confidence and stability improved with RW                             Pertinent Vitals/Pain Pain Assessment Pain Assessment: Faces Faces Pain Scale: Hurts a little bit Pain Location: generalized Pain Descriptors / Indicators: Discomfort Pain Intervention(s): Monitored during session    Home Living Family/patient expects to be discharged to:: Private residence Living Arrangements: Alone Available Help at Discharge: Family;Available 24 hours/day Type of Home: Apartment Home Access: Stairs to enter   Entrance Stairs-Number of Steps: 3   Home Layout: Able to live on main level with bedroom/bathroom Home Equipment: None Additional Comments: reports he doesn't have DME, then states he may have walker and has used a cane recently    Prior Function Prior Level of Function : Independent/Modified Independent             Mobility Comments: inconsistent reports of PLOF and fall hx. pt reports typically independent without DME, though has had weekly falls due to legs "giving out" over the past 6-8 months ("there's so much metal in my knees") ADLs Comments: indep, had a fall in the tub/shower, drives     Hand Dominance   Dominant Hand: Right    Extremity/Trunk Assessment   Upper Extremity Assessment Upper Extremity Assessment: Generalized weakness    Lower Extremity Assessment Lower Extremity Assessment: Generalized weakness    Cervical / Trunk Assessment Cervical / Trunk Assessment: Normal (h/o cervical and lumbar sxs)  Communication   Communication: No difficulties  Cognition Arousal/Alertness: Awake/alert Behavior During Therapy: Anxious Overall Cognitive Status: No  family/caregiver present to determine baseline cognitive functioning                                 General Comments: follows commands however pt with multiple complaints requiring frequent cues for attention and redirection. pt adamantly refusing to walk initially due to instability, then later states, "I told you I need to walk more." inconsistent reports of PLOF and concerns        General Comments General comments (skin integrity, edema, etc.): no nystagmus noted with upward/downward/lateral gaze, pt endorses dizziness with looking down that quickly resolved. VSS, BP stable post-ambulation; pt reports nursing had just performed orthostatic vitals    Exercises     Assessment/Plan    PT Assessment Patient needs continued PT services  PT Problem List Decreased strength;Decreased activity tolerance;Decreased balance;Decreased mobility;Decreased cognition;Decreased knowledge of use of DME;Decreased safety awareness       PT Treatment Interventions DME instruction;Gait training;Stair training;Functional mobility training;Therapeutic activities;Therapeutic exercise;Balance training;Patient/family education    PT Goals (Current goals can be found in the Care Plan section)  Acute Rehab PT Goals Patient Stated Goal: "I've got to go home before my surgery" PT Goal Formulation: With patient Time For Goal Achievement: 03/25/22 Potential to Achieve Goals: Good    Frequency Min 3X/week     Co-evaluation               AM-PAC PT "6 Clicks" Mobility  Outcome Measure Help needed turning from  your back to your side while in a flat bed without using bedrails?: None Help needed moving from lying on your back to sitting on the side of a flat bed without using bedrails?: None Help needed moving to and from a bed to a chair (including a wheelchair)?: A Little Help needed standing up from a chair using your arms (e.g., wheelchair or bedside chair)?: A Little Help needed to  walk in hospital room?: A Little Help needed climbing 3-5 steps with a railing? : A Lot 6 Click Score: 19    End of Session Equipment Utilized During Treatment: Gait belt Activity Tolerance: Patient tolerated treatment well Patient left: in bed;with call bell/phone within reach Nurse Communication: Mobility status PT Visit Diagnosis: Other abnormalities of gait and mobility (R26.89);Muscle weakness (generalized) (M62.81)    Time: 2263-3354 PT Time Calculation (min) (ACUTE ONLY): 28 min   Charges:   PT Evaluation $PT Eval Moderate Complexity: [place], PT, DPT Acute Rehabilitation Services  Personal: [facility] Office: [place] 03/11/2022, 11:12 AM

## 2022-03-11 NOTE — Evaluation (Signed)
Occupational Therapy Evaluation Patient Details Name: Dwayne Vargas MRN: [id number] DOB: 17-Aug-1945 Today's Date: 03/11/2022   History of Present Illness Dwayne Vargas is a 77 y.o. male who presented to the ED after 3x falls at home. Of note, recent ED visit with UTI. High suspicion for BBPV, persistent vertigo x 1 week with negative brain MRI at symptom onset. PMHx: bipolar 1 disorder, HTN, DM, and anxiety   Clinical Impression   Dwayne Vargas was evaluated s/p the above admission list, he is typically indep at baseline and lives with his wife who is able to assist as needed at d/c. Of note, pt reports he has a "major surgery" in a week and has plans to complete rehab after. Upon evaluation pt was limited by fear of falling, anxiety, generalized weakness, unsteady gait and decreased activity tolerance. Overall he required up to min A +2 for transfers and mobility with RW and encouragement to participate. Due to the deficits listed below, he requires up to mod A for LB ADLs. No nystagmus noted after positional changes or head turns; pt did report some mild dizziness with downward gaze that quickly resolved. OT to continue to follow acutely. Recommend d/c home with [place] and support from family.      Recommendations for follow up therapy are one component of a multi-disciplinary discharge planning process, led by the attending physician.  Recommendations may be updated based on patient status, additional functional criteria and insurance authorization.   Follow Up Recommendations  Home health OT     Assistance Recommended at Discharge Frequent or constant Supervision/Assistance  Patient can return home with the following A little help with walking and/or transfers;A little help with bathing/dressing/bathroom;Assistance with cooking/housework;Direct supervision/assist for medications management;Direct supervision/assist for financial management;Assist for transportation;Help with stairs or ramp for entrance     Functional Status Assessment  Patient has had a recent decline in their functional status and demonstrates the ability to make significant improvements in function in a reasonable and predictable amount of time.  Equipment Recommendations  Other (comment) (RW)    Recommendations for Other Services       Precautions / Restrictions Precautions Precautions: Fall Restrictions Weight Bearing Restrictions: No      Mobility Bed Mobility Overal bed mobility: Needs Assistance Bed Mobility: Supine to Sit, Sit to Supine     Supine to sit: Min guard Sit to supine: Min guard   General bed mobility comments: close min G due to potential BPPV    Transfers Overall transfer level: Needs assistance Equipment used: Rolling walker (2 wheels) Transfers: Sit to/from Stand Sit to Stand: Min assist, +2 safety/equipment                  Balance Overall balance assessment: Needs assistance Sitting-balance support: Feet supported Sitting balance-Leahy Scale: Good     Standing balance support: Single extremity supported, During functional activity Standing balance-Leahy Scale: Fair Standing balance comment: able to stand to urinate and groom                           ADL either performed or assessed with clinical judgement   ADL Overall ADL's : Needs assistance/impaired Eating/Feeding: Independent;Sitting   Grooming: Min guard;Standing Grooming Details (indicate cue type and reason): washed hands at the sink Upper Body Bathing: Set up;Sitting   Lower Body Bathing: Minimal assistance;Sit to/from stand   Upper Body Dressing : Set up;Sitting   Lower Body Dressing: Moderate assistance;Sit to/from stand   Toilet  Transfer: Minimal assistance;Ambulation;Rolling walker (2 wheels)   Toileting- Clothing Manipulation and Hygiene: Sit to/from stand;Min guard       Functional mobility during ADLs: Minimal assistance;Rolling walker (2 wheels) General ADL Comments: +2 for  safety pt reports multiple falls. requires encouragement due to fear of falling. benefits from RW     Vision Baseline Vision/History: 0 No visual deficits Vision Assessment?: No apparent visual deficits     Perception Perception Perception Tested?: No   Praxis Praxis Praxis tested?: Not tested    Pertinent Vitals/Pain Pain Assessment Pain Assessment: Faces Faces Pain Scale: Hurts a little bit Pain Location: generalized Pain Descriptors / Indicators: Discomfort     Hand Dominance Right   Extremity/Trunk Assessment Upper Extremity Assessment Upper Extremity Assessment: Generalized weakness   Lower Extremity Assessment Lower Extremity Assessment: Generalized weakness   Cervical / Trunk Assessment Cervical / Trunk Assessment: Normal   Communication Communication Communication: No difficulties   Cognition Arousal/Alertness: Awake/alert Behavior During Therapy: Anxious Overall Cognitive Status: No family/caregiver present to determine baseline cognitive functioning                                 General Comments: follows commands however needs cues for attention and re-direction. very anxious about falling     General Comments  VSS on RA no nystagmus noted this date    Exercises     Shoulder Instructions      Home Living Family/patient expects to be discharged to:: Private residence Living Arrangements: Alone Available Help at Discharge: Family;Available 24 hours/day Type of Home: Apartment Home Access: Stairs to enter Entrance Stairs-Number of Steps: 3   Home Layout: Able to live on main level with bedroom/bathroom     Bathroom Shower/Tub: Chief Strategy Officer: [employer]     Home Equipment: None   Additional Comments: reports he doesn't have DME, then states he may have walker and has used a cane recently      Prior Functioning/Environment Prior Level of Function : Independent/Modified Independent              Mobility Comments: inconsistent reports of PLOF and fall hx. pt reports typically independent without DME, though has had weekly falls due to legs "giving out" over the past 6-8 months ("there's so much metal in my knees") ADLs Comments: indep, had a fall in the tub/shower, drives        OT Problem List: Decreased strength;Decreased range of motion;Decreased activity tolerance;Impaired balance (sitting and/or standing);Decreased cognition;Decreased safety awareness;Decreased knowledge of use of DME or AE;Decreased knowledge of precautions      OT Treatment/Interventions: Self-care/ADL training;Therapeutic exercise;DME and/or AE instruction;Therapeutic activities;Patient/family education;Balance training    OT Goals(Current goals can be found in the care plan section) Acute Rehab OT Goals Patient Stated Goal: to stop falling OT Goal Formulation: With patient Time For Goal Achievement: 03/25/22 Potential to Achieve Goals: Good ADL Goals Pt Will Perform Grooming: with modified independence;standing Pt Will Perform Lower Body Dressing: with modified independence;sit to/from stand Pt Will Transfer to Toilet: with modified independence;ambulating Additional ADL Goal #1: pt will tolerate at least 10 minutes of OOB funcitonal activity with supervision A for safety only  OT Frequency: Min 2X/week    Co-evaluation              AM-PAC OT "6 Clicks" Daily Activity     Outcome Measure Help from another person eating meals?: None Help from another person  taking care of personal grooming?: A Little Help from another person toileting, which includes using toliet, bedpan, or urinal?: A Little Help from another person bathing (including washing, rinsing, drying)?: A Little Help from another person to put on and taking off regular upper body clothing?: A Little Help from another person to put on and taking off regular lower body clothing?: A Lot 6 Click Score: 18   End of Session Equipment  Utilized During Treatment: Rolling walker (2 wheels) Nurse Communication: Mobility status  Activity Tolerance: Patient tolerated treatment well Patient left: in bed;with call bell/phone within reach  OT Visit Diagnosis: Unsteadiness on feet (R26.81);Other abnormalities of gait and mobility (R26.89);Repeated falls (R29.6);Muscle weakness (generalized) (M62.81);History of falling (Z91.81);BPPV                Time: 9390-3009 OT Time Calculation (min): 27 min Charges:  OT General Charges $OT Visit: 1 Visit OT Evaluation $OT Eval Moderate Complexity: 1 Mod    Venba Zenner D Causey 03/11/2022, 10:28 AM

## 2022-03-11 NOTE — ED Provider Notes (Addendum)
[facility] EMERGENCY DEPARTMENT Provider Note   CSN: [id number] Arrival date & time: 03/11/22  0029     History  Chief Complaint  Patient presents with   Dwayne Vargas is a 77 y.o. male.  77 y/o male with hx of bipolar 1 disorder, HTN, DM, and anxiety presents to the ED after a fall at home. States that he fell ~3 times tonight; fell from bed, when trying to walk to bathroom, and while in the bathroom. C/o pain in his L hip which is improved with L hip flexion. Also reports some mild low back pain. Denies headache. He is unsure of LOC and head trauma. Further denies CP, SOB, new vision changes or loss, tinnitus or new hearing changes/loss, extremity numbness or paresthesias, extremity weakness. Patient reports difficulty getting up off the floor, prompting EMS call. He was able to ambulate with assistance with EMS. Not chronically anticoagulated.  States that he has continued to have dizziness on/off since last ED visit 1 week ago. This was worse this AM. He has been compliant with his prescribed abx for UTI.       Home Medications Prior to Admission medications   Medication Sig Start Date End Date Taking? Authorizing Provider  amLODipine (NORVASC) 5 MG tablet Take 0.5 tablets (2.5 mg total) by mouth daily. Patient taking differently: Take 5 mg by mouth daily. 01/05/22   Carlos Levering, NP  cephALEXin (KEFLEX) 500 MG capsule Take 1 capsule (500 mg total) by mouth 4 (four) times daily. 03/05/22   Arby Barrette, MD  colchicine 0.6 MG tablet Take 1 tablet (0.6 mg total) by mouth daily. 12/05/21   Chilton Si, MD  DUPIXENT 300 MG/2ML SOPN Inject into the skin every 14 (fourteen) days. Patient not taking: Reported on 01/05/2022 11/16/21   [provider]  fluticasone (FLONASE) 50 MCG/ACT nasal spray Place 1-2 sprays into both nostrils daily as needed for allergies or rhinitis. Patient not taking: Reported on 01/05/2022    [provider]  HYDROcodone-acetaminophen (NORCO/VICODIN) 5-325 MG tablet Take 1 tablet by mouth 4 (four) times daily as needed. Patient not taking: Reported on 01/05/2022 12/01/21   [provider]  hydrOXYzine (ATARAX) 25 MG tablet Take 1 tablet (25 mg total) by mouth every 6 (six) hours as needed for itching. Patient not taking: Reported on 12/05/2021 11/11/21   Jeannie Fend, PA-C  lamoTRIgine (LAMICTAL) 100 MG tablet Take 100 mg by mouth at bedtime. Patient not taking: Reported on 01/05/2022 11/24/21   [provider]  meclizine (ANTIVERT) 25 MG tablet Take 1 tablet (25 mg total) by mouth 3 (three) times daily as needed for dizziness. 03/05/22   Arby Barrette, MD  Multiple Vitamin (MULTIVITAMIN WITH MINERALS) TABS tablet Take 1 tablet by mouth daily.    [provider]  omeprazole (PRILOSEC) 20 MG capsule Take 20 mg by mouth daily. Patient not taking: Reported on 01/05/2022    [provider]  permethrin (ELIMITE) 5 % cream Apply to affected area once Patient not taking: Reported on 01/05/2022 11/11/21   Army Melia A, PA-C  rosuvastatin (CRESTOR) 10 MG tablet Take 1 tablet (10 mg total) by mouth daily. 01/05/22   Carlos Levering, NP  traZODone (DESYREL) 50 MG tablet Take 100 mg by mouth at bedtime.     [provider]  valsartan (DIOVAN) 320 MG tablet Take 1 tablet (320 mg total) by mouth daily. 01/05/22   Carlos Levering, NP  Allergies    Aspirin, Contrast media [iodinated contrast media], Fentanyl, Morphine and related, Oxycontin [oxycodone hcl], Shellfish allergy, and Diazepam    Review of Systems   Review of Systems Ten systems reviewed and are negative for acute change, except as noted in the HPI.    Physical Exam Updated Vital Signs BP (!) 147/74 (BP Location: Right Arm)   Pulse 75   Temp 97.9 F (36.6 C)   Resp 18   SpO2 100%   Physical Exam Vitals and nursing note reviewed.  Constitutional:      General: He is not in  acute distress.    Appearance: He is well-developed. He is not diaphoretic.     Comments: Nontoxic appearing and in NAD  HENT:     Head: Normocephalic.     Comments: No battle's sign or raccoon's eyes. Eyes:     General: No scleral icterus.    Conjunctiva/sclera: Conjunctivae normal.  Cardiovascular:     Rate and Rhythm: Normal rate and regular rhythm.     Pulses: Normal pulses.     Comments: DP pulse 2+ b/l Pulmonary:     Effort: Pulmonary effort is normal. No respiratory distress.     Comments: Respirations even and unlabored Abdominal:     Palpations: Abdomen is soft.  Musculoskeletal:        General: Normal range of motion.     Cervical back: Normal range of motion.     Comments: TTP of the left hip, crease of the left groin, without crepitus or deformity. No leg shortening or malrotation. AROM of the LLE is preserved. No TTP, crepitus, deformities, step offs to the thoracolumbar midline.   Skin:    General: Skin is warm and dry.     Coloration: Skin is not pale.     Findings: No erythema or rash.  Neurological:     Mental Status: He is alert and oriented to person, place, and time.     Coordination: Coordination normal.     Comments: GCS 15. Speech is goal oriented. Answers questions appropriately. Follows simple commands. Moving all extremities spontaneously, symmetrically.   Psychiatric:        Behavior: Behavior normal.     ED Results / Procedures / Treatments   Labs (all labs ordered are listed, but only abnormal results are displayed) Labs Reviewed  URINALYSIS, ROUTINE W REFLEX MICROSCOPIC - Abnormal; Notable for the following components:      Result Value   Glucose, UA >=500 (*)    All other components within normal limits  CBC WITH DIFFERENTIAL/PLATELET - Abnormal; Notable for the following components:   Hemoglobin 12.1 (*)    HCT 37.4 (*)    All other components within normal limits  BASIC METABOLIC PANEL - Abnormal; Notable for the following components:    Glucose, Bld 107 (*)    Creatinine, Ser 1.49 (*)    GFR, Estimated 48 (*)    All other components within normal limits    EKG EKG Interpretation  Date/Time:  Sunday March 11 2022 00:45:33 EST Ventricular Rate:  70 PR Interval:  204 QRS Duration: 105 QT Interval:  398 QTC Calculation: 430 R Axis:   48 Text Interpretation: Sinus rhythm Confirmed by Quintella Reichert [phone]) on 03/11/2022 3:09:27 AM  Radiology DG Femur Min 2 Views Left  Result Date: 03/11/2022 CLINICAL DATA:  Recent fall with left hip pain, initial encounter EXAM: LEFT FEMUR 2 VIEWS COMPARISON:  None Available. FINDINGS: Left knee prosthesis is noted. Degenerative changes of  the hip joint are noted. No acute fracture or dislocation is noted. Postsurgical changes in the lower lumbar spine are again seen. IMPRESSION: Degenerative change without acute abnormality. Electronically Signed   By: Alcide Clever M.D.   On: 03/11/2022 03:18   DG Knee Complete 4 Views Left  Result Date: 03/11/2022 CLINICAL DATA:  Recent fall with left knee pain, initial encounter EXAM: LEFT KNEE - COMPLETE 4+ VIEW COMPARISON:  None Available. FINDINGS: Left knee prosthesis is noted. No acute fracture or dislocation is noted. No joint effusion is seen. No findings to suggest loosening are noted. IMPRESSION: Status post left knee replacement.  No acute abnormality noted. Electronically Signed   By: Alcide Clever M.D.   On: 03/11/2022 03:17   CT HEAD WO CONTRAST ( )  Result Date: 03/11/2022 CLINICAL DATA:  Head trauma, minor (Age >= 65y) EXAM: CT HEAD WITHOUT CONTRAST TECHNIQUE: Contiguous axial images were obtained from the base of the skull through the vertex without intravenous contrast. RADIATION DOSE REDUCTION: This exam was performed according to the departmental dose-optimization program which includes automated exposure control, adjustment of the mA and/or kV according to patient size and/or use of iterative reconstruction technique. COMPARISON:   CT head 03/04/2022, MRI head 03/05/2022 FINDINGS: Brain: Patchy and confluent areas of decreased attenuation are noted throughout the deep and periventricular white matter of the cerebral hemispheres bilaterally, compatible with chronic microvascular ischemic disease. No evidence of large-territorial acute infarction. No parenchymal hemorrhage. No mass lesion. No extra-axial collection. No mass effect or midline shift. No hydrocephalus. Basilar cisterns are patent. Vascular: No hyperdense vessel. Skull: No acute fracture or focal lesion. Sinuses/Orbits: Paranasal sinuses and mastoid air cells are clear. Bilateral lens replacement. Otherwise the orbits are unremarkable. Other: None. IMPRESSION: No acute intracranial abnormality. Electronically Signed   By: Tish Frederickson M.D.   On: 03/11/2022 03:15   DG Hip Unilat W or Wo Pelvis 2-3 Views Left  Result Date: 03/11/2022 CLINICAL DATA:  Recent fall with left hip pain, initial encounter EXAM: DG HIP (WITH OR WITHOUT PELVIS) 3V LEFT COMPARISON:  None Available. FINDINGS: Postsurgical changes are noted in the right hip and lumbar spine. Pelvic ring is intact. The proximal left femur shows degenerative changes in left hip joint. No acute fracture or dislocation is noted. No soft tissue changes are seen. IMPRESSION: No acute abnormality noted. Electronically Signed   By: Alcide Clever M.D.   On: 03/11/2022 02:25   DG Lumbar Spine Complete  Result Date: 03/11/2022 CLINICAL DATA:  Recent fall with low back pain, initial encounter EXAM: LUMBAR SPINE - COMPLETE 4+ VIEW COMPARISON:  None Available. FINDINGS: Postsurgical changes are again noted at L4-5 and L5-S1 with interbody fusion, vertebral augmentation and pedicle screw placement. The fixation cap on the right S1 screw appears displaced. No prior exams available for comparison. Osteophytic changes are seen. No compression deformity is noted. Fecal material is noted throughout the colon. No pars defects are noted.  IMPRESSION: Degenerative and postoperative changes as described. Capping screw on the right S1 pedicle screw is displaced. This is of uncertain chronicity. Electronically Signed   By: Alcide Clever M.D.   On: 03/11/2022 02:24    Procedures Procedures    Medications Ordered in ED Medications  sodium chloride 0.9 % bolus 500 mL (0 mLs Intravenous Stopped 03/11/22 0411)  acetaminophen (TYLENOL) tablet 650 mg (650 mg Oral Given 03/11/22 0414)  meclizine (ANTIVERT) tablet 25 mg (25 mg Oral Given 03/11/22 0507)    ED Course/ Medical Decision Making/  A&P Clinical Course as of 03/11/22 5456  Sun Mar 11, 2022  0130 Head CT and MRI brain from 03/04/22 reviewed. Ordered in the setting of vertiginous symptoms; negative for acute pathology. [KH]  0330 UTI appears to be clearing; UA today improved compared to 1 week ago. Imaging visualized and are w/o acute or traumatic processes. [YB]  6389 Plan to ambulate to assess for potential safe dispo. Also pending orthostatic vital signs. [KH]  0409 Negative orthostatic vital signs. [KH]  0429 Unable to ambulate. Patient extremely unsteady when simply standing. Patient lives at home alone. Feel discharge would be inappropriate given his current functional status. Will place order for PT/OT consult this AM. [KH]    Clinical Course User Index [KH] Antonietta Breach, PA-C                             Medical Decision Making Amount and/or Complexity of Data Reviewed Labs: ordered. Radiology: ordered.  Risk OTC drugs.   This patient presents to the ED for concern of frequent falls, this involves an extensive number of treatment options, and is a complaint that carries with it a high risk of complications and morbidity.  The differential diagnosis includes BPPV vs central vertigo vs hypotension/vasovagal syncope vs acute intoxication vs medication toxicity.    Co morbidities that complicate the patient evaluation  HTN DM Bipolar disorder   Additional history  obtained:  Additional history obtained from EMS External records from outside source obtained and reviewed including MRI from 03/05/2022 which was negative for acute process.   Lab Tests:  I Ordered, and personally interpreted labs.  The pertinent results include:  UTI on UA has cleared compared to 1 week ago. CBC stable, unchanged from baseline. Creatinine 1.49 (up from 1.31 one week ago).   Imaging Studies ordered:  I ordered imaging studies including CT head  I independently visualized and interpreted imaging which showed no acute intracranial process. I also ordered imaging studies including Xray lumbar spine, L hip and pelvis, L femur, L knee I independently visualized and interpreted imaging which showed no acute fracture or bony deformity. I agree with the radiologist interpretation   Cardiac Monitoring:  The patient was maintained on a cardiac monitor.  I personally viewed and interpreted the cardiac monitored which showed an underlying rhythm of: NSR   Medicines ordered and prescription drug management:  I ordered medication including Meclizine for dizziness and Tylenol for pain.  Reevaluation of the patient after these medicines showed that the patient stayed the same I have reviewed the patients home medicines and have made adjustments as needed   Test Considered:  Repeat MRI brain - however symptoms have been ongoing since last ED visit; symptoms continue to wax/wane.   Consultations Obtained:  I requested consultation with PT and OT to assess functional status as patient resides alone in a house. Consultation is currently outstanding.   Problem List / ED Course:  As above    Reevaluation:  After the interventions noted above, I reevaluated the patient and found that they have :stayed the same   Social Determinants of Health:  Unsafe ambulation due to balance issues Lives at home alone   Dispostion:  Given persistent vertigo x 1 week with negative  brain MRI at symptom onset, high suspicion for BPPV. After consideration of the diagnostic results and the patients response to treatment, I feel that the patent would benefit from PT/OT evaluation. May necessitate admission, vestibular rehab  if continues to experience symptoms.   Care signed out to [facility], PA-C at shift change.     Final Clinical Impression(s) / ED Diagnoses Final diagnoses:  Fall in home, initial encounter  Vertigo    Rx / DC Orders ED Discharge Orders     None         Antony Madura, PA-C 03/11/22 0628    Antony Madura, PA-C 03/11/22 4401    Tilden Fossa, MD 03/16/22 [phone]

## 2022-03-11 NOTE — Consult Note (Addendum)
Neurology Consultation  Reason for Consult: dizziness Referring Physician: Dr. Anitra Lauth  CC: fall and dizziness  History is obtained from:patient and medical record   HPI: Dwayne Vargas is a 77 y.o. male with past medical history of Anxiety and depression, bipolar disorder, HOH, blind in left eye, HTN, DM who presents to the Ed for evaluation of frequent falls. He states he first started to experience "dizziness" one week ago. He states the first time last week he had a fall became dizzy, had nausea and vomiting that lasted about 2 hours and resolved. He states that last night he fell while trying to get out of the bed, hit his head on the table and then was not able to get back up due to weakness so he called EMS.  Once he falls, he states he is unable to get him self up due to weakness and he has to sit there for a while before he is able to get up. He endorses feeling lightheaded and at times some nausea just prior to events. He uses the word vertigo often, and when asked to clarify denies a description of a spinning sensation. He states there have been no medication changes recently. He denies dizziness, diaphoresis, weakness, numbness or tingling or vision changes He uses a walker at baseline and states he is steady on his feet when ambulating. These events do not happen from a standing position.  CT head with no acute abnormality  Neurology consulted    ROS: Full ROS was performed and is negative except as noted in the HPI.    Past Medical History:  Diagnosis Date   Anxiety    Benign localized prostatic hyperplasia with lower urinary tract symptoms (LUTS)    Bipolar 1 disorder (HCC)    Complication of anesthesia    limited neck motion due to cervical spine surgery   Depression    ED (erectile dysfunction) of organic origin    History of penile cancer    s/p  excision of cancer 2013   HOH (hard of hearing)    left ear   Hypertension    Irritable bowel    intermittant diarrhea   secondary to antibiotics   Legally blind in left eye, as defined in [place]    Stricture, prostate    urethra   Type 2 diabetes mellitus (HCC)    Weak urinary stream    intermittant     Family History  Problem Relation Age of Onset   Diabetes Cousin      Social History:   reports that he quit smoking about 5 years ago. His smoking use included cigarettes. He has a 53.00 pack-year smoking history. He has never used smokeless tobacco. He reports that he does not drink alcohol and does not use drugs.  Medications No current facility-administered medications for this encounter.  Current Outpatient Medications:    amLODipine (NORVASC) 5 MG tablet, Take 0.5 tablets (2.5 mg total) by mouth daily. (Patient taking differently: Take 5 mg by mouth daily.), Disp: 90 tablet, Rfl: 3   cephALEXin (KEFLEX) 500 MG capsule, Take 1 capsule (500 mg total) by mouth 4 (four) times daily., Disp: 28 capsule, Rfl: 0   colchicine 0.6 MG tablet, Take 1 tablet (0.6 mg total) by mouth daily., Disp: 90 tablet, Rfl: 0   DUPIXENT 300 MG/2ML SOPN, Inject into the skin every 14 (fourteen) days. (Patient not taking: Reported on 01/05/2022), Disp: , Rfl:    fluticasone (FLONASE) 50 MCG/ACT nasal spray, Place 1-2 sprays  into both nostrils daily as needed for allergies or rhinitis. (Patient not taking: Reported on 01/05/2022), Disp: , Rfl:    HYDROcodone-acetaminophen (NORCO/VICODIN) 5-325 MG tablet, Take 1 tablet by mouth 4 (four) times daily as needed. (Patient not taking: Reported on 01/05/2022), Disp: , Rfl:    hydrOXYzine (ATARAX) 25 MG tablet, Take 1 tablet (25 mg total) by mouth every 6 (six) hours as needed for itching. (Patient not taking: Reported on 12/05/2021), Disp: 12 tablet, Rfl: 0   lamoTRIgine (LAMICTAL) 100 MG tablet, Take 100 mg by mouth at bedtime. (Patient not taking: Reported on 01/05/2022), Disp: , Rfl:    meclizine (ANTIVERT) 25 MG tablet, Take 1 tablet (25 mg total) by mouth 3 (three) times daily as  needed for dizziness., Disp: 30 tablet, Rfl: 0   Multiple Vitamin (MULTIVITAMIN WITH MINERALS) TABS tablet, Take 1 tablet by mouth daily., Disp: , Rfl:    omeprazole (PRILOSEC) 20 MG capsule, Take 20 mg by mouth daily. (Patient not taking: Reported on 01/05/2022), Disp: , Rfl:    permethrin (ELIMITE) 5 % cream, Apply to affected area once (Patient not taking: Reported on 01/05/2022), Disp: 60 g, Rfl: 0   rosuvastatin (CRESTOR) 10 MG tablet, Take 1 tablet (10 mg total) by mouth daily., Disp: 90 tablet, Rfl: 3   traZODone (DESYREL) 50 MG tablet, Take 100 mg by mouth at bedtime. , Disp: , Rfl:    valsartan (DIOVAN) 320 MG tablet, Take 1 tablet (320 mg total) by mouth daily., Disp: 90 tablet, Rfl: 3   Exam: Current vital signs: BP (!) 140/68   Pulse 69   Temp 99 F (37.2 C) (Oral)   Resp (!) 23   SpO2 97%  Vital signs in last 24 hours: Temp:  [97.9 F (36.6 C)-99 F (37.2 C)] 99 F (37.2 C) (01/14 4332) Pulse Rate:  [69-75] 69 (01/14 0820) Resp:  [18-23] 23 (01/14 0820) BP: (140-150)/(68-80) 140/68 (01/14 0820) SpO2:  [97 %-100 %] 97 % (01/14 0820)  GENERAL: Awake, alert in NAD HEENT: - Normocephalic and atraumatic, dry mm LUNGS - Clear to auscultation bilaterally with no wheezes CV - S1S2 RRR, no m/r/g, equal pulses bilaterally. ABDOMEN - Soft, nontender, nondistended with normoactive BS Ext: warm, well perfused, intact peripheral pulses, no edema  NEURO:  Mental Status: AA&Ox3  Language: speech is clear.  Naming, repetition, fluency, and comprehension intact. Cranial Nerves: PERRL EOMI few beats of nystagmus on left lateral gaze, visual fields full, no facial asymmetry, facial sensation intact, hearing intact, tongue/uvula/soft palate midline, normal sternocleidomastoid and trapezius muscle strength. No evidence of tongue atrophy or fibrillations Motor: bilateral uppers 5/5, bilateral lower 4/5 Tone: is normal and bulk is normal Sensation- Intact to light touch  bilaterally Coordination: FTN intact bilaterally, no ataxia in BLE. Gait- deferred  Labs I have reviewed labs in epic and the results pertinent to this consultation are:  CBC    Component Value Date/Time   WBC 9.1 03/11/2022 0137   RBC 4.25 03/11/2022 0137   HGB 12.1 (L) 03/11/2022 0137   HCT 37.4 (L) 03/11/2022 0137   PLT 219 03/11/2022 0137   MCV 88.0 03/11/2022 0137   MCH 28.5 03/11/2022 0137   MCHC 32.4 03/11/2022 0137   RDW 13.0 03/11/2022 0137   LYMPHSABS 1.2 03/11/2022 0137   MONOABS 0.6 03/11/2022 0137   EOSABS 0.3 03/11/2022 0137   BASOSABS 0.1 03/11/2022 0137    CMP     Component Value Date/Time   NA 135 03/11/2022 0137   NA  140 01/22/2022 1015   K 4.1 03/11/2022 0137   CL 104 03/11/2022 0137   CO2 22 03/11/2022 0137   GLUCOSE 107 (H) 03/11/2022 0137   BUN 19 03/11/2022 0137   BUN 20 01/22/2022 1015   CREATININE 1.49 (H) 03/11/2022 0137   CALCIUM 9.9 03/11/2022 0137   PROT 7.3 03/04/2022 2256   ALBUMIN 4.6 03/04/2022 2256   AST 11 (L) 03/04/2022 2256   ALT 9 03/04/2022 2256   ALKPHOS 64 03/04/2022 2256   BILITOT 0.3 03/04/2022 2256   GFRNONAA 48 (L) 03/11/2022 0137   GFRAA >60 07/23/2015 0520    Lipid Panel     Component Value Date/Time   CHOL 145 01/12/2020 0533   TRIG 95 01/12/2020 0533   HDL 47 01/12/2020 0533   CHOLHDL 3.1 01/12/2020 0533   VLDL 19 01/12/2020 0533   LDLCALC 79 01/12/2020 0533     Imaging I have reviewed the images obtained:  CT-head no acute abnormality   Ortho static vitals  Lying  140/70, Pulse 72 Sitting  133/72, pulse 73 Standing @ 0 minutes  121/37, pulse 76 Standing @ 3 minutes  115/66, pulse 88   Assessment:  77 y.o. male with past medical history of Anxiety and depression, bipolar disorder, HOH, blind in left eye, HTN, DM who presents to the Ed for evaluation of two episodes of transient difficulty getting off the floor.  It is possible this represents recurrent disequilibrium in which case I would be  concerned that the initial episode was a TIA and since he is still symptomatic now, that would make the stroke.  Orthostatics is also possible.  I would favor getting an MRI, if this is negative then I would have PT/OT evaluate and for further management.  Recommendations: - check orthostatic vital signs  - MRI brain to evaluate for stroke. If positive for stroke will continue with full stroke workup   Beulah Gandy  ACNPC-AG  [facility]  I have seen the patient reviewed the above note.  On pressing him, he has only had two episodes, one on Monday lasting approximately 2 hours as well as one that started last night and is now persistent.  As above I would favor getting an MRI to rule out stroke, but I think orthostasis is a possibility as well.  If MRI is negative, then I would favor treating his orthostasis, also could consider working with PT/OT.  Roland Rack, MD [facility] [phone]  If 7pm- 7am, please page neurology on call as listed in [facility].

## 2022-03-11 NOTE — ED Provider Notes (Signed)
Signout from [employer] at shift change. Briefly, patient with history of bipolar 1 disorder, HTN, DM, and anxiety --presents to the emergency department after having multiple falls overnight.  Patient's symptoms started about a week ago.  He states that he has been having difficulties with walking and has had frequent falls.  After he falls he has trouble getting himself up off the floor.  He states that last night he ambulated to the bathroom and then fell forward to the ground and could not get himself up.  He was unable to mobilize and had several other falls.  He uses the word vertigo but does not describe a spinning sensation to me.  Denies focal weakness, speech difficulties, vision changes, but does report lifelong "tunneling" of vision.  Symptoms resolved spontaneously about a week ago during ED visit.  He had an MRI at that time which was negative.   7:43 AM Reassessment performed. Patient needed to use the urinal while I was in the room.  With the assistance of an RN, patient needed assistance to stand at bedside but once he was up could stand and use the urinal.  He had a lot of difficulty maneuvering in bed to get himself up.  Most current vital signs reviewed and are as follows: BP (!) 147/74 (BP Location: Right Arm)   Pulse 75   Temp 97.9 F (36.6 C)   Resp 18   SpO2 100%   I consulted with Dr. Leonel Ramsay of neurology.  They will see the patient at some point to provide recommendations on how to proceed, medical vs placement.   11:50 AM PT/OT reccs reviewed. Patient would benefit from RW upon d/c.   I spoke with Dr. Leonel Ramsay. Agrees that symptoms are more dysequilibrium related and not c/w vertigo.  Recommends repeat MRI due to concern that patient's initial symptoms may have represented a TIA and that he could have had a stroke.  If MRI is negative, no indication for admission for stroke workup.   If MRI is negative, will try to ensure patient has a rolling walker to  help with stability at home.  2:19 PM Reassessment performed. Patient appears well.  He is motivated to go home.  Patient's daughter is now at bedside.  Labs personally reviewed and interpreted including:  Imaging personally visualized and interpreted including: MRI brain, negative.  Reviewed pertinent lab work and imaging with patient at bedside. Questions answered.   Most current vital signs reviewed and are as follows: BP 127/77   Pulse 73   Temp 98.2 F (36.8 C) (Oral)   Resp 18   SpO2 98%   Plan: Discharge to home.  Patient's daughter included with discussion.  She has some concerns over polypharmacy and would like to follow-up with a neurologist as an outpatient.  I will place referral on their behalf.  I offered assistance in obtaining a rolling walker, however she states that they will be able to obtain one for him.  Patient counseled to return if they have weakness in their arms or legs, slurred speech, trouble walking or talking, confusion, trouble with their balance, or if they have any other concerns. Patient verbalizes understanding and agrees with plan.   Encourage PCP follow-up for recheck in 2 to 3 days.  Also for discussion of medication concerns.     Carlisle Cater, PA-C 03/11/22 1421    Blanchie Dessert, MD 03/11/22 [phone]

## 2022-03-14 ENCOUNTER — Encounter: Payer: Self-pay | Admitting: Neurology

## 2022-03-14 ENCOUNTER — Ambulatory Visit: Payer: Medicare PPO | Admitting: Neurology

## 2022-03-14 VITALS — BP 94/64 | HR 76 | Ht 71.0 in | Wt 185.5 lb

## 2022-03-14 DIAGNOSIS — W19XXXD Unspecified fall, subsequent encounter: Secondary | ICD-10-CM | POA: Diagnosis not present

## 2022-03-14 DIAGNOSIS — R269 Unspecified abnormalities of gait and mobility: Secondary | ICD-10-CM

## 2022-03-14 NOTE — Patient Instructions (Signed)
Continue with lamotrigine 200 mg daily Continue your other medications Increase exercise Return as needed

## 2022-03-14 NOTE — Progress Notes (Signed)
[facility]  PATIENT: Dwayne Vargas DOB: [birth year]  REQUESTING CLINICIAN: Renne Crigler, PA-C HISTORY FROM: Patient, daughter and chart review  REASON FOR VISIT: Recent falls    HISTORICAL  CHIEF COMPLAINT:  Chief Complaint  Patient presents with   Hospitalization Follow-up    Rm 12. Accompanied by daughter, Dwayne Vargas. NP Urgent ED referral for gait instability and frequent falls, tunnel vision.    HISTORY OF PRESENT ILLNESS:  This is a 77 year old gentleman past medical history of bipolar disorder, on lamotrigine, hypertension, hyperlipidemia who is presenting after 2 falls.  Patient reports having 2 falls, 1 on January 8 and another one on January 14.  In both episodes, he felt extreme dizziness, inability to get up after fall.  In the ED, he had a MRI that was negative and discharged home on  the 14 again his MRI brain was negative.  Per daughter on the 7 or 8 January his psychiatrist increase the lamotrigine from 200 mg to 400 mg and since then he has been having the gait instability and falls.  Patient describes the gait abnormality as walking like he is drunk. After the episode on January 14 the lamotrigine has been cut back to 200 mg nightly and since then he has seen marked improvement. He reports for the past year his right knee has been giving out and which contributed to his gait abnormality but since they decreased the lamotrigine he reports that he feels much better.  He does not feel like he is drunk all the time.   OTHER MEDICAL CONDITIONS: Bipolar disorder, Hypertension, Hyperlipidemia   REVIEW OF SYSTEMS: Full 14 system review of systems performed and negative with exception of: As noted in the HPI   ALLERGIES: Allergies  Allergen Reactions   Aspirin Other (See Comments)    GI upset   Contrast Media [Iodinated Contrast Media] Hives and Swelling   Fentanyl     Pt wishes not to have   Morphine And Related Other (See Comments)    Tremors     Oxycontin [Oxycodone Hcl] Other (See Comments)    "pt does not wish to have"   Shellfish Allergy Hives    All fish and shellfish w/ iodine   Diazepam Other (See Comments)    Patient does not want to use    HOME MEDICATIONS: Outpatient Medications Prior to Visit  Medication Sig Dispense Refill   amLODipine (NORVASC) 5 MG tablet Take 0.5 tablets (2.5 mg total) by mouth daily. (Patient taking differently: Take 5 mg by mouth daily.) 90 tablet 3   Bacillus Coagulans-Inulin (PROBIOTIC) 1-250 BILLION-MG CAPS Take by mouth.     lamoTRIgine (LAMICTAL) 200 MG tablet Take 100 mg by mouth at bedtime.     rosuvastatin (CRESTOR) 10 MG tablet Take 1 tablet (10 mg total) by mouth daily. 90 tablet 3   traZODone (DESYREL) 50 MG tablet Take 100 mg by mouth at bedtime.      valsartan (DIOVAN) 320 MG tablet Take 1 tablet (320 mg total) by mouth daily. 90 tablet 3   cephALEXin (KEFLEX) 500 MG capsule Take 1 capsule (500 mg total) by mouth 4 (four) times daily. 28 capsule 0   colchicine 0.6 MG tablet Take 1 tablet (0.6 mg total) by mouth daily. 90 tablet 0   meclizine (ANTIVERT) 25 MG tablet Take 1 tablet (25 mg total) by mouth 3 (three) times daily as needed for dizziness. 30 tablet 0   Multiple Vitamin (MULTIVITAMIN WITH MINERALS) TABS tablet Take 1 tablet  by mouth daily.     No facility-administered medications prior to visit.    PAST MEDICAL HISTORY: Past Medical History:  Diagnosis Date   Anxiety    Benign localized prostatic hyperplasia with lower urinary tract symptoms (LUTS)    Bipolar 1 disorder (HCC)    Complication of anesthesia    limited neck motion due to cervical spine surgery   Depression    ED (erectile dysfunction) of organic origin    History of penile cancer    s/p  excision of cancer 2013   HOH (hard of hearing)    left ear   Hypertension    Irritable bowel    intermittant diarrhea  secondary to antibiotics   Legally blind in left eye, as defined in [place]    Stricture, prostate     urethra   Type 2 diabetes mellitus ([place])    Weak urinary stream    intermittant    PAST SURGICAL HISTORY: Past Surgical History:  Procedure Laterality Date   CATARACT EXTRACTION W/ INTRAOCULAR LENS  IMPLANT, BILATERAL  2007   CERVICAL FUSION  05/ 2016   [facility]   C2 -- C4   CHOLECYSTECTOMY  2013  approx   EXCISION PENILE CANCER  2013   NASAL SEPTUM SURGERY  x2  last one 1990's   SHOULDER ARTHROSCOPY WITH ROTATOR CUFF REPAIR Left 1990's   TOTAL KNEE ARTHROPLASTY Bilateral left 2007/  right and revision in 2009   [place] N/A 11/21/2015   Procedure: TRANSURETHRAL INCISION OF THE PROSTATE (TUIP);  Surgeon: Nickie Retort, MD;  Location: [facility];  Service: Urology;  Laterality: N/A;   TRANSURETHRAL RESECTION OF PROSTATE N/A 07/22/2015   Procedure: TRANSURETHRAL RESECTION OF THE PROSTATE ;  Surgeon: Nickie Retort, MD;  Location: [facility];  Service: Urology;  Laterality: N/A;   VIDEO ASSISTED THORACOSCOPY (VATS)/THOROCOTOMY Left 08/12/2014   Procedure: VIDEO ASSISTED THORACOSCOPY (VATS)/THOROCOTOMY, DRAINAGE OF EMPYEMA;  Surgeon: Gaye Pollack, MD;  Location: [facility];  Service: Thoracic;  Laterality: Left;    FAMILY HISTORY: Family History  Problem Relation Age of Onset   Diabetes Cousin     SOCIAL HISTORY: Social History   Socioeconomic History   Marital status: Widowed    Spouse name: Not on file   Number of children: Not on file   Years of education: Not on file   Highest education level: Not on file  Occupational History   Not on file  Tobacco Use   Smoking status: Former    Packs/day: 1.00    Years: 53.00    Total pack years: 53.00    Types: Cigarettes    Quit date: 2019    Years since quitting: 5.0   Smokeless tobacco: Never  Vaping Use   Vaping Use: Never used  Substance and Sexual Activity   Alcohol use: No    Alcohol/week: 0.0 standard drinks of alcohol   Drug use: No   Sexual activity: Never  Other Topics  Concern   Not on file  Social History Narrative   Not on file   Social Determinants of Health   Financial Resource Strain: Not on file  Food Insecurity: Not on file  Transportation Needs: Not on file  Physical Activity: Not on file  Stress: Not on file  Social Connections: Not on file  Intimate Partner Violence: Not on file    PHYSICAL EXAM  GENERAL EXAM/CONSTITUTIONAL: Vitals:  Vitals:   03/14/22 1526 03/14/22 1528  BP: 119/76 94/64  Pulse: 75 76  Weight: 185 lb 8 oz (84.1 kg)   Height: 5\' 11"  (1.803 m)    Body mass index is 25.87 kg/m. Wt Readings from Last 3 Encounters:  03/14/22 185 lb 8 oz (84.1 kg)  03/04/22 190 lb (86.2 kg)  01/05/22 202 lb (91.6 kg)   Patient is in no distress; well developed, nourished and groomed; neck is supple  EYES: Visual fields full to confrontation, Extraocular movements intacts,   MUSCULOSKELETAL: Gait, strength, tone, movements noted in Neurologic exam below  NEUROLOGIC: MENTAL STATUS:      No data to display         awake, alert, oriented to person, place and time recent and remote memory intact normal attention and concentration language fluent, comprehension intact, naming intact fund of knowledge appropriate  CRANIAL NERVE:  2nd, 3rd, 4th, 6th - Visual fields full to confrontation, extraocular muscles intact, no nystagmus 5th - facial sensation symmetric 7th - facial strength symmetric 8th - hearing intact 9th - palate elevates symmetrically, uvula midline 11th - shoulder shrug symmetric 12th - tongue protrusion midline  MOTOR:  normal bulk and tone, full strength in the BUE, BLE  SENSORY:  normal and symmetric to light touch  COORDINATION:  finger-nose-finger, fine finger movements normal. Faint tremors noted on extension on both arms   GAIT/STATION:  Antalgic gait     DIAGNOSTIC DATA (LABS, IMAGING, TESTING) - I reviewed patient records, labs, notes, testing and imaging myself where  available.  Lab Results  Component Value Date   WBC 9.1 03/11/2022   HGB 12.1 (L) 03/11/2022   HCT 37.4 (L) 03/11/2022   MCV 88.0 03/11/2022   PLT 219 03/11/2022      Component Value Date/Time   NA 135 03/11/2022 0137   NA 140 01/22/2022 1015   K 4.1 03/11/2022 0137   CL 104 03/11/2022 0137   CO2 22 03/11/2022 0137   GLUCOSE 107 (H) 03/11/2022 0137   BUN 19 03/11/2022 0137   BUN 20 01/22/2022 1015   CREATININE 1.49 (H) 03/11/2022 0137   CALCIUM 9.9 03/11/2022 0137   PROT 7.3 03/04/2022 2256   ALBUMIN 4.6 03/04/2022 2256   AST 11 (L) 03/04/2022 2256   ALT 9 03/04/2022 2256   ALKPHOS 64 03/04/2022 2256   BILITOT 0.3 03/04/2022 2256   GFRNONAA 48 (L) 03/11/2022 0137   GFRAA >60 07/23/2015 0520   Lab Results  Component Value Date   CHOL 145 01/12/2020   HDL 47 01/12/2020   LDLCALC 79 01/12/2020   TRIG 95 01/12/2020   CHOLHDL 3.1 01/12/2020   Lab Results  Component Value Date   HGBA1C 5.6 01/11/2020   No results found for: "VITAMINB12" No results found for: "TSH"  MRI Brain 03/11/2022 1. No acute intracranial abnormality or significant interval change. 2. Stable moderate generalized atrophy and white matter disease. This likely reflects the sequela of chronic microvascular ischemia    ASSESSMENT AND PLAN  77 y.o. year old male with history of bipolar disorder, gait abnormality, hypertension, hyperlipidemia who is presenting after to fall and inability to get up. This was in the setting of patient lamotrigine being doubled to 400 mg likely causing the gait abnormality.  Since decreasing the lamotrigine back to 200 mg he has been back to his normal self.  At this time I will recommend physical therapy, he agrees to exercise by himself and not go to PT.  Advised him to continue current medications and to return as needed  1. Fall, subsequent encounter   2. Gait abnormality      Patient Instructions  Continue with lamotrigine 200 mg daily Continue your other  medications Increase exercise Return as needed    No orders of the defined types were placed in this encounter.   No orders of the defined types were placed in this encounter.   Return if symptoms worsen or fail to improve.    Windell Norfolk, MD 03/14/2022, 6:32 PM  [facility] [address], [address] [place], [place] [address] [phone]

## 2022-03-23 ENCOUNTER — Encounter (HOSPITAL_COMMUNITY): Payer: Self-pay

## 2022-03-23 ENCOUNTER — Emergency Department (HOSPITAL_COMMUNITY): Payer: Medicare PPO

## 2022-03-23 ENCOUNTER — Emergency Department (HOSPITAL_COMMUNITY)
Admission: EM | Admit: 2022-03-23 | Discharge: 2022-03-23 | Disposition: A | Discharge status: Home / Self Care | Payer: Medicare PPO | Attending: Emergency Medicine | Admitting: Emergency Medicine

## 2022-03-23 DIAGNOSIS — E119 Type 2 diabetes mellitus without complications: Secondary | ICD-10-CM | POA: Insufficient documentation

## 2022-03-23 DIAGNOSIS — N99820 Postprocedural hemorrhage and hematoma of a genitourinary system organ or structure following a genitourinary system procedure: Secondary | ICD-10-CM | POA: Insufficient documentation

## 2022-03-23 DIAGNOSIS — Y732 Prosthetic and other implants, materials and accessory gastroenterology and urology devices associated with adverse incidents: Secondary | ICD-10-CM | POA: Insufficient documentation

## 2022-03-23 DIAGNOSIS — I10 Essential (primary) hypertension: Secondary | ICD-10-CM | POA: Diagnosis not present

## 2022-03-23 DIAGNOSIS — T83021A Displacement of indwelling urethral catheter, initial encounter: Secondary | ICD-10-CM

## 2022-03-23 DIAGNOSIS — T83028A Displacement of other indwelling urethral catheter, initial encounter: Secondary | ICD-10-CM | POA: Insufficient documentation

## 2022-03-23 DIAGNOSIS — Z79899 Other long term (current) drug therapy: Secondary | ICD-10-CM | POA: Insufficient documentation

## 2022-03-23 LAB — CBC WITH DIFFERENTIAL/PLATELET
Abs Immature Granulocytes: 0.04 10*3/uL (ref 0.00–0.07)
Basophils Absolute: 0.1 10*3/uL (ref 0.0–0.1)
Basophils Relative: 1 %
Eosinophils Absolute: 0.2 10*3/uL (ref 0.0–0.5)
Eosinophils Relative: 2 %
HCT: 37.7 % — ABNORMAL LOW (ref 39.0–52.0)
Hemoglobin: 11.9 g/dL — ABNORMAL LOW (ref 13.0–17.0)
Immature Granulocytes: 0 %
Lymphocytes Relative: 12 %
Lymphs Abs: 1.2 10*3/uL (ref 0.7–4.0)
MCH: 27.9 pg (ref 26.0–34.0)
MCHC: 31.6 g/dL (ref 30.0–36.0)
MCV: 88.3 fL (ref 80.0–100.0)
Monocytes Absolute: 0.7 10*3/uL (ref 0.1–1.0)
Monocytes Relative: 7 %
Neutro Abs: 8.2 10*3/uL — ABNORMAL HIGH (ref 1.7–7.7)
Neutrophils Relative %: 78 %
Platelets: 213 10*3/uL (ref 150–400)
RBC: 4.27 MIL/uL (ref 4.22–5.81)
RDW: 12.8 % (ref 11.5–15.5)
WBC: 10.4 10*3/uL (ref 4.0–10.5)
nRBC: 0 % (ref 0.0–0.2)

## 2022-03-23 LAB — BASIC METABOLIC PANEL
Anion gap: 10 (ref 5–15)
BUN: 18 mg/dL (ref 8–23)
CO2: 24 mmol/L (ref 22–32)
Calcium: 9.8 mg/dL (ref 8.9–10.3)
Chloride: 103 mmol/L (ref 98–111)
Creatinine, Ser: 1.24 mg/dL (ref 0.61–1.24)
GFR, Estimated: 60 mL/min — ABNORMAL LOW (ref >60)
Glucose, Bld: 102 mg/dL — ABNORMAL HIGH (ref 70–99)
Potassium: 3.9 mmol/L (ref 3.5–5.1)
Sodium: 137 mmol/L (ref 135–145)

## 2022-03-23 LAB — PROTIME-INR
INR: 1.1 (ref 0.8–1.2)
Prothrombin Time: 14.4 seconds (ref 11.4–15.2)

## 2022-03-23 LAB — APTT: aPTT: 28 seconds (ref 24–36)

## 2022-03-23 MED ORDER — IOHEXOL 300 MG/ML  SOLN
50.0000 mL | Freq: Once | INTRAMUSCULAR | Status: AC | PRN
  Administered 2022-03-23: 40 mL via URETHRAL

## 2022-03-23 NOTE — Discharge Instructions (Signed)
Thank you for coming to [facility] Emergency Department. You were seen for traumatic dislodgment of your Foley catheter. We did an exam, labs, and imaging, and these showed no acute findings.  Please follow up with your urologist as originally scheduled on Monday.  Please do not remove the Foley catheter again.  This could result in serious injury to your bladder or urethra that may require surgery.  Please continue taking antibiotics as prescribed.  Do not hesitate to return to the ED or call 911 if you experience: -Worsening symptoms -Lightheadedness, passing out -Fevers/chills -Anything else that concerns you

## 2022-03-23 NOTE — Consult Note (Signed)
Consultation: Urethral injury Requested by: Dr. Eulogio Bear  History of Present Illness: Dwayne Vargas is a 77 year old male who recently underwent penile prosthesis placement.  He was thought to be postoperative genital Foley catheter was placed and he was discharged.  He was very upset today with his daughter because she wants him to take some medicines and pulled on his Foley.  There was some concern on bedside ultrasound an intact Foley balloon was in the bladder, but on inspection the catheter is fractured at its hub but it is intact.  It was a Silastic catheter and the tip and the balloon are present in place and intact.  The catheter is fractured but the entire catheter is present.  The red tape is still around the hub and it is attached to the drainage bag.  Likely what was seen on the pelvic ultrasound was the reservoir for the IPP with postop fluid around it.  The patient has not been able to void and has had gross blood per meatus.  Past Medical History:  Diagnosis Date   Anxiety    Benign localized prostatic hyperplasia with lower urinary tract symptoms (LUTS)    Bipolar 1 disorder (HCC)    Complication of anesthesia    limited neck motion due to cervical spine surgery   Depression    ED (erectile dysfunction) of organic origin    History of penile cancer    s/p  excision of cancer 2013   HOH (hard of hearing)    left ear   Hypertension    Irritable bowel    intermittant diarrhea  secondary to antibiotics   Legally blind in left eye, as defined in [place]    Stricture, prostate    urethra   Type 2 diabetes mellitus (HCC)    Weak urinary stream    intermittant   Past Surgical History:  Procedure Laterality Date   CATARACT EXTRACTION W/ INTRAOCULAR LENS  IMPLANT, BILATERAL  2007   CERVICAL FUSION  05/ 2016   [facility]   C2 -- C4   CHOLECYSTECTOMY  2013  approx   EXCISION PENILE CANCER  2013   NASAL SEPTUM SURGERY  x2  last one 1990's   SHOULDER ARTHROSCOPY WITH ROTATOR CUFF REPAIR Left  1990's   TOTAL KNEE ARTHROPLASTY Bilateral left 2007/  right and revision in 2009   TRANSURETHRAL INCISION OF PROSTATE N/A 11/21/2015   Procedure: TRANSURETHRAL INCISION OF THE PROSTATE (TUIP);  Surgeon: Hildred Laser, MD;  Location: [facility];  Service: Urology;  Laterality: N/A;   TRANSURETHRAL RESECTION OF PROSTATE N/A 07/22/2015   Procedure: TRANSURETHRAL RESECTION OF THE PROSTATE ;  Surgeon: Hildred Laser, MD;  Location: [facility];  Service: Urology;  Laterality: N/A;   VIDEO ASSISTED THORACOSCOPY (VATS)/THOROCOTOMY Left 08/12/2014   Procedure: VIDEO ASSISTED THORACOSCOPY (VATS)/THOROCOTOMY, DRAINAGE OF EMPYEMA;  Surgeon: Alleen Borne, MD;  Location: [facility];  Service: Thoracic;  Laterality: Left;    Home Medications:  (Not in a hospital admission)  Allergies:  Allergies  Allergen Reactions   Aspirin Other (See Comments)    GI upset   Contrast Media [Iodinated Contrast Media] Hives and Swelling   Fentanyl     Pt wishes not to have   Morphine And Related Other (See Comments)    Tremors    Oxycontin [Oxycodone Hcl] Other (See Comments)    "pt does not wish to have"   Shellfish Allergy Hives    All fish and shellfish w/ iodine   Diazepam  Other (See Comments)    Patient does not want to use    Family History  Problem Relation Age of Onset   Diabetes Cousin    Social History:  reports that he quit smoking about 5 years ago. His smoking use included cigarettes. He has a 53.00 pack-year smoking history. He has never used smokeless tobacco. He reports that he does not drink alcohol and does not use drugs.  ROS: A complete review of systems was performed.  All systems are negative except for pertinent findings as noted. Review of Systems  All other systems reviewed and are negative.    Physical Exam:  Vital signs in last 24 hours: Temp:  [98.2 F (36.8 C)] 98.2 F (36.8 C) (01/26 1522) Pulse Rate:  [54-79] 54 (01/26 1800) Resp:  [18] 18 (01/26  1800) BP: (121-143)/(57-71) 124/67 (01/26 1800) SpO2:  [96 %-100 %] 99 % (01/26 1800) General:  Alert and oriented, No acute distress HEENT: Normocephalic, atraumatic Neck: No JVD or lymphadenopathy Cardiovascular: Regular rate and rhythm Lungs: Regular rate and effort Abdomen: Soft, nontender, nondistended, no abdominal masses Back: No CVA tenderness Extremities: No edema Neurologic: Grossly intact GU: Penile prosthesis palpable and in appropriate position.  There is gross blood per meatus but on inspection and palpation of the prosthesis there is no evidence of glans, urethral or meatal rupture of the prosthesis.  Incision is clean dry and intact.  Pump palpable in the scrotum.  There is gross blood per meatus.  Procedure: I discussed with the patient retrograde urethrogram and Foley catheter placement and he elected to proceed.  Dr. Rosana Hoes from [facility] returned page and will take patient in ER to ER transfer if there was bladder neck disc rupture or some other serious injury.  He agreed with a rug and Foley placement or cystoscopy and Foley placement.  I passed x-ray tech in the hall and will proceed with a rug.  R UG: Scout image was obtained.  Penile prosthesis rear tips were visible.  Good alignment of the x-ray.  Meatus was prepped with Betadine and a 66 French Foley catheter was advanced into the urethra and the balloon inflated with 2 cc to secure it.  50 cc of Omnipaque was injected and 2 scout images were obtained.  This revealed filling of the ball of with typical reflux into some of the glands and veins, but more importantly continuity along the prostatic urethra and filling of the bladder.  A 16 French Foley catheter was removed.  Foley catheter placement: He will prepped and draped in the usual sterile fashion.  An 67 French coud catheter was advanced without difficulty into the bladder.  Bladder initially drained red and then drained clear urine.  I did a 60 cc syringe of  irrigation again urine remained clear.  Laboratory Data:  Results for orders placed or performed during the hospital encounter of 03/23/22 (from the past 24 hour(s))  CBC with Differential     Status: Abnormal   Collection Time: 03/23/22  5:13 PM  Result Value Ref Range   WBC 10.4 4.0 - 10.5 K/uL   RBC 4.27 4.22 - 5.81 MIL/uL   Hemoglobin 11.9 (L) 13.0 - 17.0 g/dL   HCT 37.7 (L) 39.0 - 52.0 %   MCV 88.3 80.0 - 100.0 fL   MCH 27.9 26.0 - 34.0 pg   MCHC 31.6 30.0 - 36.0 g/dL   RDW 12.8 11.5 - 15.5 %   Platelets 213 150 - 400 K/uL  nRBC 0.0 0.0 - 0.2 %   Neutrophils Relative % 78 %   Neutro Abs 8.2 (H) 1.7 - 7.7 K/uL   Lymphocytes Relative 12 %   Lymphs Abs 1.2 0.7 - 4.0 K/uL   Monocytes Relative 7 %   Monocytes Absolute 0.7 0.1 - 1.0 K/uL   Eosinophils Relative 2 %   Eosinophils Absolute 0.2 0.0 - 0.5 K/uL   Basophils Relative 1 %   Basophils Absolute 0.1 0.0 - 0.1 K/uL   Immature Granulocytes 0 %   Abs Immature Granulocytes 0.04 0.00 - 0.07 K/uL  Basic metabolic panel     Status: Abnormal   Collection Time: 03/23/22  5:13 PM  Result Value Ref Range   Sodium 137 135 - 145 mmol/L   Potassium 3.9 3.5 - 5.1 mmol/L   Chloride 103 98 - 111 mmol/L   CO2 24 22 - 32 mmol/L   Glucose, Bld 102 (H) 70 - 99 mg/dL   BUN 18 8 - 23 mg/dL   Creatinine, Ser 1.24 0.61 - 1.24 mg/dL   Calcium 9.8 8.9 - 10.3 mg/dL   GFR, Estimated 60 (L) >60 mL/min   Anion gap 10 5 - 15  Protime-INR     Status: None   Collection Time: 03/23/22  5:13 PM  Result Value Ref Range   Prothrombin Time 14.4 11.4 - 15.2 seconds   INR 1.1 0.8 - 1.2  APTT     Status: None   Collection Time: 03/23/22  5:13 PM  Result Value Ref Range   aPTT 28 24 - 36 seconds   No results found for this or any previous visit (from the past 240 hour(s)). Creatinine: Recent Labs    03/23/22 1713  CREATININE 1.24    Impression/Assessment:  Urethral injury from traumatic catheter removal, postoperative urinary  retention-  Plan:  Foley catheter replaced.  Continue Foley catheter.  Follow-up with urologist at [place].  Patient reports he is taking antibiotics postoperatively for the prosthesis and this should cover the urine as well.  Festus Aloe 03/23/2022, 6:49 PM

## 2022-03-23 NOTE — ED Triage Notes (Signed)
Pt reports that he had a inflatable penile prosthetic placed yesterday and then today he ripped the entire thing out. There was some of the prosthetic that broke off so EMS is unsure as to if there is any of it left in place. EMS reports he ripped it out because he was upset with family. Pt is alert and oriented at this time. Pt also had a catheter attached that was ripped out as well. Per EMS, there was a catheter and a bulb attached and bulb is not present on pt, unsure as to whether it is still inside.

## 2022-03-23 NOTE — ED Provider Notes (Signed)
[place] EMERGENCY DEPARTMENT AT [facility] Provider Note   CSN: [id number] Arrival date & time: 03/23/22  1503     History  Chief Complaint  Patient presents with   Post-op Problem    Dwayne Vargas is a 77 y.o. male with ED s/p penile prosthesis insertion 03/22/22, HTN, BPH, T2DM, bipolar disorder, HLD, who presents with post-op problem.   Patient is accompanied by his wife who provides additional history.  Per chart review patient was admitted at [facility] urology on 03/22/2022 to 03/23/2022 for penile prosthesis insertion, received overnight observation IV antibiotics.  Has difficulty urinating at baseline.  Patient was unable to void and postvoid residual demonstrated 350 cc in bladder so a 16 French Foley catheter was placed on discharge with plan to repeat voiding trial on Monday in clinic.  Today he got an argument with his daughter and became frustrated with the Foley catheter and forcefully pulled it out.  He had acute onset severe penile and suprapubic pain associated with profuse bright red bleeding from his penis and called 911.  EMS notes that the the catheter is not intact.  Right now patient reports 8 out of 10 pain in his penis and suprapubic region.  Patient has not urinated since the incident.  Has not had any fevers or chills, lightheadedness, falls, LOC.  Other than that was in his normal state of health.  Patient brought the Foley catheter in a plastic bag which is observed at bedside to be fractured with the balloon not attached or located in the bag.  Does not take any blood thinners.  HPI     Home Medications Prior to Admission medications   Medication Sig Start Date End Date Taking? Authorizing Provider  amLODipine (NORVASC) 5 MG tablet Take 0.5 tablets (2.5 mg total) by mouth daily. Patient taking differently: Take 5 mg by mouth daily. 01/05/22   Carlos Levering, NP  Bacillus Coagulans-Inulin (PROBIOTIC) 1-250 BILLION-MG  CAPS Take by mouth.    [provider]  lamoTRIgine (LAMICTAL) 200 MG tablet Take 100 mg by mouth at bedtime. 11/24/21   [provider]  rosuvastatin (CRESTOR) 10 MG tablet Take 1 tablet (10 mg total) by mouth daily. 01/05/22   Carlos Levering, NP  traZODone (DESYREL) 50 MG tablet Take 100 mg by mouth at bedtime.     [provider]  valsartan (DIOVAN) 320 MG tablet Take 1 tablet (320 mg total) by mouth daily. 01/05/22   Carlos Levering, NP      Allergies    Aspirin, Contrast media [iodinated contrast media], Fentanyl, Morphine and related, Oxycontin [oxycodone hcl], Shellfish allergy, and Diazepam    Review of Systems   Review of Systems Review of systems Negative for f/c.  A 10 point review of systems was performed and is negative unless otherwise reported in HPI.  Physical Exam Updated Vital Signs BP (!) 143/70   Pulse 64   Temp 98.2 F (36.8 C)   Resp 18   SpO2 100%  Physical Exam General: Normal appearing male, lying in bed.  HEENT: Sclera anicteric, MMM, trachea midline.  Cardiology: RRR, no murmurs/rubs/gallops. BL radial and DP pulses equal bilaterally.  Resp: Normal respiratory rate and effort. CTAB, no wheezes, rhonchi, crackles.  Abd: Suprapubic tenderness palpation.  Soft, non-distended. No rebound tenderness or guarding.  GU: Blood at urethral meatus. TTP to penile shaft and suprapubic region.  No tender palpation of bilateral testicles or scrotum. MSK: No peripheral edema or signs of  trauma. Extremities without deformity or TTP. No cyanosis or clubbing. Skin: warm, dry. No rashes or lesions. Neuro: A&Ox4, CNs II-XII grossly intact. MAEs. Sensation grossly intact.  Psych: Normal mood and affect.   ED Results / Procedures / Treatments   Labs (all labs ordered are listed, but only abnormal results are displayed) Labs Reviewed  CBC WITH DIFFERENTIAL/PLATELET - Abnormal; Notable for the following components:      Result Value    Hemoglobin 11.9 (*)    HCT 37.7 (*)    Neutro Abs 8.2 (*)    All other components within normal limits  BASIC METABOLIC PANEL - Abnormal; Notable for the following components:   Glucose, Bld 102 (*)    GFR, Estimated 60 (*)    All other components within normal limits  PROTIME-INR  APTT  URINALYSIS, ROUTINE W REFLEX MICROSCOPIC    EKG None  Radiology No results found.  Procedures Procedures    EMERGENCY DEPARTMENT ULTRASOUND  Study: Limited Ultrasound of Bladder  INDICATIONS: to assess for urinary retention and/or bladder volume prior to urinary catheter Multiple views of the bladder were obtained in real-time in the transverse and longitudinal planes with a multi-frequency probe.  PERFORMED BY: Myself IMAGES ARCHIVED?: No LIMITATIONS:  None INTERPRETATION:  Foley catheter bulb fully inflated noted inside bladder with adjacent fluid collect w/ poorly defined irregular borders      Medications Ordered in ED Medications  iohexol (OMNIPAQUE) 300 MG/ML solution 50 mL (40 mLs Urethral Contrast Given 03/23/22 1920)    ED Course/ Medical Decision Making/ A&P                          Medical Decision Making Amount and/or Complexity of Data Reviewed Labs: ordered.    This patient presents to the ED for concern of postop problem and concern for unintentional prosthesis dislodgment; this involves an extensive number of treatment options, and is a complaint that carries with it a high risk of complications and morbidity.  I considered the following differential and admission for this acute, potentially life threatening condition.   MDM:    Patient with traumatic Foley removal with profuse bleeding, penile pain, and suprapubic pain.  On exam patient is noted to continue to have slow low-volume bleeding from his urethra as well as penile and suprapubic tender palpation.  Initially I was concerned that bedside ultrasound demonstrated fully bulb still located in the bladder with  adjacent irregular border fluid collection that could possibly represent extravasation of bladder contents, however urology informed me that actually this represents the reservoir for the IPP.  Consider urethral injury as well.  Consider acute blood loss anemia given patient's report of profuse bleeding, though he is hemodynamically stable and well-appearing, no lightheadedness, low concern for hemorrhage. will consult with urology about next steps which possibly will include retrograde urethrogram or CT abdomen pelvis.  Labs ordered including coags.  Clinical Course as of 03/23/22 1943  Ludwig Clarks Mar 23, 2022  1732 D/w urology who recommends CT abd pelvis. Given that he has a contrast allergy will perform wo contrast. Also recommends calling urology at [facility]. [HN]  [id number] D/w urology. What was seen on BSUS is the reservoir for IPP rather than foley balloon. [facility] u[facility] Dr. Junious Silk at bedside will perform RUG. D/w Dr. Rosana Hoes from [facility] urology, and Dr. Rosana Hoes d/w Dr. Junious Silk. If RUG abnormal will transfer ED to ED to [facility] performed rug at bedside  without any evidence of urethral or bladder injury.  Urine initially drained red but then drained clear. Patient does have an allergy to IV contrast but only intravesicular contrast was used and a Foley was placed immediately after which drained the contrast.  Patient has not had any signs of allergic reaction to the contrast media.  Patient feels well and would like to be discharged home.  He states that someone told him he could have the catheter removed but I explained to him that urology stated he needed to have the catheter remain in place until Monday as originally planned.  Patient is instructed to not remove the Foley catheter traumatically at home again.  He is to continue taking his postoperative antibiotics as prescribed.  Patient be discharged with discharge instructions and return precautions, all questions answered to patient  satisfaction. [HN]    Clinical Course User Index [HN] Audley Hose, MD    Labs: I Ordered, and personally interpreted labs.  The pertinent results include:  those listed above  Imaging Studies ordered: Imaging studies including RUG with urology at bedside Urology interpreted the imaging  Additional history obtained from chart review.  External records from outside source obtained and reviewed including [facility] notes  Reevaluation: After the interventions noted above, I reevaluated the patient and found that they have :improved  Social Determinants of Health: Patient lives independently   Disposition:  DC w/ urology f/u on Monday  Co morbidities that complicate the patient evaluation  Past Medical History:  Diagnosis Date   Anxiety    Benign localized prostatic hyperplasia with lower urinary tract symptoms (LUTS)    Bipolar 1 disorder ([place])    Complication of anesthesia    limited neck motion due to cervical spine surgery   Depression    ED (erectile dysfunction) of organic origin    History of penile cancer    s/p  excision of cancer 2013   HOH (hard of hearing)    left ear   Hypertension    Irritable bowel    intermittant diarrhea  secondary to antibiotics   Legally blind in left eye, as defined in [place]    Stricture, prostate    urethra   Type 2 diabetes mellitus (HCC)    Weak urinary stream    intermittant     Medicines Meds ordered this encounter  Medications   iohexol (OMNIPAQUE) 300 MG/ML solution 50 mL    I have reviewed the patients home medicines and have made adjustments as needed  Problem List / ED Course: Problem List Items Addressed This Visit   None Visit Diagnoses     Displacement of Foley catheter, initial encounter [facility])    -  Primary                   This note was created using dictation software, which may contain spelling or grammatical errors.    Audley Hose, MD 03/23/22  [phone]

## 2022-07-19 ENCOUNTER — Other Ambulatory Visit: Payer: Self-pay

## 2022-07-19 ENCOUNTER — Encounter (HOSPITAL_COMMUNITY): Payer: Self-pay

## 2022-07-19 ENCOUNTER — Inpatient Hospital Stay (HOSPITAL_COMMUNITY)
Admission: EM | Admit: 2022-07-19 | Discharge: 2022-07-22 | DRG: 312 | Disposition: A | Discharge status: Home / Self Care | Payer: Medicare PPO | Attending: Internal Medicine | Admitting: Internal Medicine

## 2022-07-19 DIAGNOSIS — Z8549 Personal history of malignant neoplasm of other male genital organs: Secondary | ICD-10-CM

## 2022-07-19 DIAGNOSIS — Z7982 Long term (current) use of aspirin: Secondary | ICD-10-CM

## 2022-07-19 DIAGNOSIS — Z87891 Personal history of nicotine dependence: Secondary | ICD-10-CM

## 2022-07-19 DIAGNOSIS — Z91041 Radiographic dye allergy status: Secondary | ICD-10-CM

## 2022-07-19 DIAGNOSIS — F319 Bipolar disorder, unspecified: Secondary | ICD-10-CM | POA: Diagnosis present

## 2022-07-19 DIAGNOSIS — R2689 Other abnormalities of gait and mobility: Secondary | ICD-10-CM | POA: Diagnosis present

## 2022-07-19 DIAGNOSIS — I129 Hypertensive chronic kidney disease with stage 1 through stage 4 chronic kidney disease, or unspecified chronic kidney disease: Secondary | ICD-10-CM | POA: Diagnosis present

## 2022-07-19 DIAGNOSIS — N183 Chronic kidney disease, stage 3 unspecified: Secondary | ICD-10-CM | POA: Diagnosis present

## 2022-07-19 DIAGNOSIS — R42 Dizziness and giddiness: Secondary | ICD-10-CM

## 2022-07-19 DIAGNOSIS — T50915A Adverse effect of multiple unspecified drugs, medicaments and biological substances, initial encounter: Secondary | ICD-10-CM | POA: Diagnosis present

## 2022-07-19 DIAGNOSIS — Z9841 Cataract extraction status, right eye: Secondary | ICD-10-CM

## 2022-07-19 DIAGNOSIS — Z9079 Acquired absence of other genital organ(s): Secondary | ICD-10-CM

## 2022-07-19 DIAGNOSIS — D649 Anemia, unspecified: Secondary | ICD-10-CM

## 2022-07-19 DIAGNOSIS — Z961 Presence of intraocular lens: Secondary | ICD-10-CM | POA: Diagnosis present

## 2022-07-19 DIAGNOSIS — W19XXXA Unspecified fall, initial encounter: Secondary | ICD-10-CM | POA: Diagnosis present

## 2022-07-19 DIAGNOSIS — E1122 Type 2 diabetes mellitus with diabetic chronic kidney disease: Secondary | ICD-10-CM | POA: Diagnosis present

## 2022-07-19 DIAGNOSIS — I952 Hypotension due to drugs: Secondary | ICD-10-CM | POA: Diagnosis not present

## 2022-07-19 DIAGNOSIS — Z9842 Cataract extraction status, left eye: Secondary | ICD-10-CM

## 2022-07-19 DIAGNOSIS — H8110 Benign paroxysmal vertigo, unspecified ear: Secondary | ICD-10-CM | POA: Clinically undetermined

## 2022-07-19 DIAGNOSIS — D509 Iron deficiency anemia, unspecified: Secondary | ICD-10-CM | POA: Diagnosis present

## 2022-07-19 DIAGNOSIS — Z602 Problems related to living alone: Secondary | ICD-10-CM | POA: Diagnosis present

## 2022-07-19 DIAGNOSIS — I10 Essential (primary) hypertension: Secondary | ICD-10-CM | POA: Diagnosis present

## 2022-07-19 DIAGNOSIS — R7309 Other abnormal glucose: Secondary | ICD-10-CM

## 2022-07-19 DIAGNOSIS — I6782 Cerebral ischemia: Secondary | ICD-10-CM | POA: Diagnosis present

## 2022-07-19 DIAGNOSIS — L209 Atopic dermatitis, unspecified: Secondary | ICD-10-CM | POA: Diagnosis present

## 2022-07-19 DIAGNOSIS — Z886 Allergy status to analgesic agent status: Secondary | ICD-10-CM

## 2022-07-19 DIAGNOSIS — Y92009 Unspecified place in unspecified non-institutional (private) residence as the place of occurrence of the external cause: Secondary | ICD-10-CM

## 2022-07-19 DIAGNOSIS — R262 Difficulty in walking, not elsewhere classified: Secondary | ICD-10-CM

## 2022-07-19 DIAGNOSIS — H5462 Unqualified visual loss, left eye, normal vision right eye: Secondary | ICD-10-CM | POA: Diagnosis present

## 2022-07-19 DIAGNOSIS — Z885 Allergy status to narcotic agent status: Secondary | ICD-10-CM

## 2022-07-19 DIAGNOSIS — N1831 Chronic kidney disease, stage 3a: Secondary | ICD-10-CM | POA: Diagnosis present

## 2022-07-19 DIAGNOSIS — R296 Repeated falls: Secondary | ICD-10-CM

## 2022-07-19 DIAGNOSIS — G4700 Insomnia, unspecified: Secondary | ICD-10-CM | POA: Diagnosis present

## 2022-07-19 DIAGNOSIS — Z981 Arthrodesis status: Secondary | ICD-10-CM

## 2022-07-19 DIAGNOSIS — E785 Hyperlipidemia, unspecified: Secondary | ICD-10-CM | POA: Diagnosis present

## 2022-07-19 DIAGNOSIS — Z79899 Other long term (current) drug therapy: Secondary | ICD-10-CM

## 2022-07-19 DIAGNOSIS — Z888 Allergy status to other drugs, medicaments and biological substances status: Secondary | ICD-10-CM

## 2022-07-19 DIAGNOSIS — F419 Anxiety disorder, unspecified: Secondary | ICD-10-CM | POA: Diagnosis present

## 2022-07-19 DIAGNOSIS — E7800 Pure hypercholesterolemia, unspecified: Secondary | ICD-10-CM | POA: Diagnosis present

## 2022-07-19 DIAGNOSIS — H9190 Unspecified hearing loss, unspecified ear: Secondary | ICD-10-CM | POA: Diagnosis present

## 2022-07-19 DIAGNOSIS — S01412A Laceration without foreign body of left cheek and temporomandibular area, initial encounter: Secondary | ICD-10-CM | POA: Diagnosis present

## 2022-07-19 DIAGNOSIS — Z9049 Acquired absence of other specified parts of digestive tract: Secondary | ICD-10-CM

## 2022-07-19 DIAGNOSIS — Z91013 Allergy to seafood: Secondary | ICD-10-CM

## 2022-07-19 DIAGNOSIS — Z96653 Presence of artificial knee joint, bilateral: Secondary | ICD-10-CM | POA: Diagnosis present

## 2022-07-19 DIAGNOSIS — Z833 Family history of diabetes mellitus: Secondary | ICD-10-CM

## 2022-07-19 DIAGNOSIS — N400 Enlarged prostate without lower urinary tract symptoms: Secondary | ICD-10-CM | POA: Diagnosis present

## 2022-07-19 MED ORDER — LORAZEPAM 1 MG PO TABS
1.0000 mg | ORAL_TABLET | Freq: Once | ORAL | Status: AC
  Administered 2022-07-20: 1 mg via ORAL
  Filled 2022-07-19: qty 1

## 2022-07-19 MED ORDER — MECLIZINE HCL 25 MG PO TABS
25.0000 mg | ORAL_TABLET | Freq: Once | ORAL | Status: AC
  Administered 2022-07-20: 25 mg via ORAL
  Filled 2022-07-19: qty 1

## 2022-07-19 NOTE — ED Provider Notes (Signed)
[place] EMERGENCY DEPARTMENT AT [facility] Provider Note   CSN: [id number] Arrival date & time: 07/19/22  2252     History  Chief Complaint  Patient presents with   Fall    Patient to ED via EMS with complaint of fall x 2 tonight. Patient told EMS he didn't remember falling a second time. Patient denies taking blood thinners. Patient denies injury from falling tonight.     Dwayne Vargas is a 77 y.o. male.  The history is provided by the patient and the EMS personnel.  Fall  He has history of hypertension, diabetes, hyperlipidemia, blindness in left eye, bipolar disorder and was brought in by ambulance after having several falls tonight.  He does not remember the falls.  However, he does endorse multiple episodes of vertigo over the last several weeks and has had several falls including last night.  He has suffered bruising to his arms and face.  He has had nausea with these episodes of vertigo.  He denies any headache.  He denies any pain currently.   Home Medications Prior to Admission medications   Medication Sig Start Date End Date Taking? Authorizing Provider  amLODipine (NORVASC) 5 MG tablet Take 0.5 tablets (2.5 mg total) by mouth daily. Patient taking differently: Take 5 mg by mouth daily. 01/05/22   Carlos Levering, NP  Bacillus Coagulans-Inulin (PROBIOTIC) 1-250 BILLION-MG CAPS Take by mouth.    [provider]  lamoTRIgine (LAMICTAL) 200 MG tablet Take 100 mg by mouth at bedtime. 11/24/21   [provider]  rosuvastatin (CRESTOR) 10 MG tablet Take 1 tablet (10 mg total) by mouth daily. 01/05/22   Carlos Levering, NP  traZODone (DESYREL) 50 MG tablet Take 100 mg by mouth at bedtime.     [provider]  valsartan (DIOVAN) 320 MG tablet Take 1 tablet (320 mg total) by mouth daily. 01/05/22   Carlos Levering, NP      Allergies    Aspirin, Contrast media [iodinated contrast media], Fentanyl, Morphine and codeine, Oxycontin  [oxycodone hcl], Shellfish allergy, and Diazepam    Review of Systems   Review of Systems  All other systems reviewed and are negative.   Physical Exam Updated Vital Signs BP (!) 144/75   Pulse 85   Temp 98 F (36.7 C) (Oral)   Resp 11   Ht 5\' 11"  (1.803 m)   Wt 82.6 kg   SpO2 99%   BMI 25.38 kg/m  Physical Exam Vitals and nursing note reviewed.   77 year old male, resting comfortably and in no acute distress. Vital signs are significant for borderline elevated blood pressure. Oxygen saturation is 99%, which is normal. Head is normocephalic.  Faint ecchymosis and superficial laceration present left malar area. PERRLA, EOMI. Oropharynx is clear. Neck is nontender without adenopathy or JVD. Back is nontender and there is no CVA tenderness. Lungs are clear without rales, wheezes, or rhonchi. Chest is nontender. Heart has regular rate and rhythm with occasional extra beat.  There is no murmur. Abdomen is soft, flat, nontender. Extremities have no cyanosis or edema, full range of motion is present.  Ecchymosis are noted on both arms. Skin is warm and dry without rash. Neurologic: Awake and alert, oriented to person and place but only partly oriented to time (thinks it is April but does know the year), cranial nerves are intact, moves all extremities equally.  Finger-nose testing is normal.  Mild lateral nystagmus is noted on and lateral gaze bilaterally.  Dizziness is  reproduced by passive head movement.  ED Results / Procedures / Treatments   Labs (all labs ordered are listed, but only abnormal results are displayed) Labs Reviewed  COMPREHENSIVE METABOLIC PANEL - Abnormal; Notable for the following components:      Result Value   Glucose, Bld 106 (*)    Total Protein 6.1 (*)    Albumin 3.1 (*)    GFR, Estimated 60 (*)    All other components within normal limits  CBC WITH DIFFERENTIAL/PLATELET - Abnormal; Notable for the following components:   RBC 4.21 (*)    Hemoglobin  10.8 (*)    HCT 34.8 (*)    MCH 25.7 (*)    RDW 16.2 (*)    Abs Immature Granulocytes 0.19 (*)    All other components within normal limits  MAGNESIUM    EKG EKG Interpretation  Date/Time:  Thursday Jul 19 2022 23:00:37 EDT Ventricular Rate:  78 PR Interval:  199 QRS Duration: 108 QT Interval:  378 QTC Calculation: 431 R Axis:   33 Text Interpretation: Sinus rhythm Premature ventricular complexes When compared with ECG of 03/11/2022, Premature ventricular complexes are now present Confirmed by Dione Booze ([id number]) on 07/19/2022 11:03:16 PM  Radiology MR BRAIN WO CONTRAST  Result Date: 07/20/2022 CLINICAL DATA:  Acute neurologic deficit EXAM: MRI HEAD WITHOUT CONTRAST TECHNIQUE: Multiplanar, multiecho pulse sequences of the brain and surrounding structures were obtained without intravenous contrast. COMPARISON:  03/11/2022 FINDINGS: Brain: No acute infarct, mass effect or extra-axial collection. No acute or chronic hemorrhage. There is multifocal hyperintense T2-weighted signal within the white matter. Generalized volume loss. The midline structures are normal. Vascular: Major flow voids are preserved. Skull and upper cervical spine: Normal calvarium and skull base. Visualized upper cervical spine and soft tissues are normal. Sinuses/Orbits:No paranasal sinus fluid levels or advanced mucosal thickening. No mastoid or middle ear effusion. Normal orbits. IMPRESSION: 1. No acute intracranial abnormality. 2. Findings of chronic small vessel ischemia and volume loss. Electronically Signed   By: Deatra Robinson M.D.   On: 07/20/2022 01:49    Procedures Procedures  Cardiac monitor shows normal sinus rhythm with occasional PVC, per my interpretation.  Medications Ordered in ED Medications  meclizine (ANTIVERT) tablet 25 mg (25 mg Oral Given 07/20/22 0006)  LORazepam (ATIVAN) tablet 1 mg (1 mg Oral Given 07/20/22 0006)    ED Course/ Medical Decision Making/ A&P                              Medical Decision Making Amount and/or Complexity of Data Reviewed Labs: ordered. Radiology: ordered.  Risk Prescription drug management. Decision regarding hospitalization.   Repeated falls with dizziness which could represent either peripheral or central vertigo.  Last known normal was several weeks ago, so he does not qualify for code stroke.  I have ordered screening labs of CBC, comprehensive metabolic panel, magnesium.  I have ordered MRI of the brain to look for evidence of recent stroke.  I have reviewed and interpreted his electrocardiogram and my interpretation is normal ECG with exception of occasional PVC.  I have reviewed his past records, and I note ED visit on 03/04/2022 for vertigo at which time MRI was negative for stroke.  I have ordered therapeutic trial of oral meclizine.  MRI shows no evidence of stroke.  I have independently viewed the images, and agree with radiologist interpretation.  I have reviewed and interpreted his laboratory tests and my interpretation is  minimal elevation of random glucose level, mildly with moderate anemia which is slightly worse than prior, normal magnesium level.  However, when attempt was made to ambulate the patient, he was unable to ambulate.  He did require sedation with lorazepam for MRI, we will repeat attempted ambulation after he has had an opportunity to metabolize the lorazepam.  Repeat attempt to ambulate the patient found him unable to stand, he is not safe for discharge.  I feel he will need to be admitted for neurology consultation, physical therapy and Occupational Therapy evaluation and may need nursing home placement.  I have discussed with Dr. Katrinka Blazing of [facility], who agrees to admit the patient.  Final Clinical Impression(s) / ED Diagnoses Final diagnoses:  Vertigo  Unable to walk  Elevated random blood glucose level  Normocytic anemia    Rx / DC Orders ED Discharge Orders     None         Dione Booze,  MD 07/20/22 [phone]

## 2022-07-20 ENCOUNTER — Emergency Department (HOSPITAL_COMMUNITY): Payer: Medicare PPO

## 2022-07-20 DIAGNOSIS — H8110 Benign paroxysmal vertigo, unspecified ear: Secondary | ICD-10-CM | POA: Clinically undetermined

## 2022-07-20 DIAGNOSIS — N400 Enlarged prostate without lower urinary tract symptoms: Secondary | ICD-10-CM

## 2022-07-20 DIAGNOSIS — E7800 Pure hypercholesterolemia, unspecified: Secondary | ICD-10-CM

## 2022-07-20 DIAGNOSIS — R296 Repeated falls: Secondary | ICD-10-CM

## 2022-07-20 DIAGNOSIS — I10 Essential (primary) hypertension: Secondary | ICD-10-CM | POA: Diagnosis not present

## 2022-07-20 DIAGNOSIS — G4700 Insomnia, unspecified: Secondary | ICD-10-CM | POA: Diagnosis present

## 2022-07-20 DIAGNOSIS — N1831 Chronic kidney disease, stage 3a: Secondary | ICD-10-CM

## 2022-07-20 DIAGNOSIS — N183 Chronic kidney disease, stage 3 unspecified: Secondary | ICD-10-CM | POA: Diagnosis present

## 2022-07-20 DIAGNOSIS — D509 Iron deficiency anemia, unspecified: Secondary | ICD-10-CM | POA: Diagnosis present

## 2022-07-20 DIAGNOSIS — F319 Bipolar disorder, unspecified: Secondary | ICD-10-CM

## 2022-07-20 DIAGNOSIS — L209 Atopic dermatitis, unspecified: Secondary | ICD-10-CM | POA: Diagnosis present

## 2022-07-20 LAB — CBC
HCT: 35.6 % — ABNORMAL LOW (ref 39.0–52.0)
Hemoglobin: 11.5 g/dL — ABNORMAL LOW (ref 13.0–17.0)
MCH: 26.3 pg (ref 26.0–34.0)
MCHC: 32.3 g/dL (ref 30.0–36.0)
MCV: 81.3 fL (ref 80.0–100.0)
Platelets: 259 10*3/uL (ref 150–400)
RBC: 4.38 MIL/uL (ref 4.22–5.81)
RDW: 16.1 % — ABNORMAL HIGH (ref 11.5–15.5)
WBC: 8.4 10*3/uL (ref 4.0–10.5)
nRBC: 0 % (ref 0.0–0.2)

## 2022-07-20 LAB — TSH: TSH: 0.448 u[IU]/mL (ref 0.350–4.500)

## 2022-07-20 LAB — COMPREHENSIVE METABOLIC PANEL
ALT: 23 U/L (ref 0–44)
AST: 18 U/L (ref 15–41)
Albumin: 3.1 g/dL — ABNORMAL LOW (ref 3.5–5.0)
Alkaline Phosphatase: 74 U/L (ref 38–126)
Anion gap: 11 (ref 5–15)
BUN: 18 mg/dL (ref 8–23)
CO2: 22 mmol/L (ref 22–32)
Calcium: 9.4 mg/dL (ref 8.9–10.3)
Chloride: 104 mmol/L (ref 98–111)
Creatinine, Ser: 1.24 mg/dL (ref 0.61–1.24)
GFR, Estimated: 60 mL/min — ABNORMAL LOW (ref >60)
Glucose, Bld: 106 mg/dL — ABNORMAL HIGH (ref 70–99)
Potassium: 4.1 mmol/L (ref 3.5–5.1)
Sodium: 137 mmol/L (ref 135–145)
Total Bilirubin: 0.4 mg/dL (ref 0.3–1.2)
Total Protein: 6.1 g/dL — ABNORMAL LOW (ref 6.5–8.1)

## 2022-07-20 LAB — CBC WITH DIFFERENTIAL/PLATELET
Abs Immature Granulocytes: 0.19 10*3/uL — ABNORMAL HIGH (ref 0.00–0.07)
Basophils Absolute: 0.1 10*3/uL (ref 0.0–0.1)
Basophils Relative: 1 %
Eosinophils Absolute: 0.3 10*3/uL (ref 0.0–0.5)
Eosinophils Relative: 4 %
HCT: 34.8 % — ABNORMAL LOW (ref 39.0–52.0)
Hemoglobin: 10.8 g/dL — ABNORMAL LOW (ref 13.0–17.0)
Immature Granulocytes: 3 %
Lymphocytes Relative: 14 %
Lymphs Abs: 1 10*3/uL (ref 0.7–4.0)
MCH: 25.7 pg — ABNORMAL LOW (ref 26.0–34.0)
MCHC: 31 g/dL (ref 30.0–36.0)
MCV: 82.7 fL (ref 80.0–100.0)
Monocytes Absolute: 0.7 10*3/uL (ref 0.1–1.0)
Monocytes Relative: 9 %
Neutro Abs: 5.1 10*3/uL (ref 1.7–7.7)
Neutrophils Relative %: 69 %
Platelets: 258 10*3/uL (ref 150–400)
RBC: 4.21 MIL/uL — ABNORMAL LOW (ref 4.22–5.81)
RDW: 16.2 % — ABNORMAL HIGH (ref 11.5–15.5)
WBC: 7.4 10*3/uL (ref 4.0–10.5)
nRBC: 0 % (ref 0.0–0.2)

## 2022-07-20 LAB — IRON AND TIBC
Iron: 26 ug/dL — ABNORMAL LOW (ref 45–182)
Saturation Ratios: 7 % — ABNORMAL LOW (ref 17.9–39.5)
TIBC: 365 ug/dL (ref 250–450)
UIBC: 339 ug/dL

## 2022-07-20 LAB — MAGNESIUM: Magnesium: 2.2 mg/dL (ref 1.7–2.4)

## 2022-07-20 LAB — FERRITIN: Ferritin: 48 ng/mL (ref 24–336)

## 2022-07-20 LAB — VITAMIN B12: Vitamin B-12: 275 pg/mL (ref 180–914)

## 2022-07-20 MED ORDER — ALBUTEROL SULFATE (2.5 MG/3ML) 0.083% IN NEBU: 2.5000 mg | INHALATION_SOLUTION | Freq: Four times a day (QID) | RESPIRATORY_TRACT | Status: DC | PRN

## 2022-07-20 MED ORDER — ACETAMINOPHEN 325 MG PO TABS: 650.0000 mg | ORAL_TABLET | Freq: Four times a day (QID) | ORAL | Status: DC | PRN

## 2022-07-20 MED ORDER — HYDRALAZINE HCL 20 MG/ML IJ SOLN: 10.0000 mg | INTRAMUSCULAR | Status: DC | PRN

## 2022-07-20 MED ORDER — TRAZODONE HCL 50 MG PO TABS
100.0000 mg | ORAL_TABLET | Freq: Every day | ORAL | Status: DC
  Administered 2022-07-20 – 2022-07-21 (×2): 100 mg via ORAL
  Filled 2022-07-20 (×2): qty 2

## 2022-07-20 MED ORDER — IRBESARTAN 300 MG PO TABS
300.0000 mg | ORAL_TABLET | Freq: Every day | ORAL | Status: DC
  Administered 2022-07-20 – 2022-07-21 (×2): 300 mg via ORAL
  Filled 2022-07-20 (×2): qty 1

## 2022-07-20 MED ORDER — HYDROCORTISONE 1 % EX CREA
TOPICAL_CREAM | Freq: Two times a day (BID) | CUTANEOUS | Status: DC
  Filled 2022-07-20 (×2): qty 28

## 2022-07-20 MED ORDER — ONDANSETRON HCL 4 MG PO TABS: 4.0000 mg | ORAL_TABLET | Freq: Four times a day (QID) | ORAL | Status: DC | PRN

## 2022-07-20 MED ORDER — RISAQUAD PO CAPS
1.0000 | ORAL_CAPSULE | Freq: Every day | ORAL | Status: DC
  Administered 2022-07-20 – 2022-07-22 (×3): 1 via ORAL
  Filled 2022-07-20 (×3): qty 1

## 2022-07-20 MED ORDER — ROSUVASTATIN CALCIUM 5 MG PO TABS
10.0000 mg | ORAL_TABLET | Freq: Every day | ORAL | Status: DC
  Administered 2022-07-20 – 2022-07-22 (×3): 10 mg via ORAL
  Filled 2022-07-20 (×3): qty 2

## 2022-07-20 MED ORDER — SODIUM CHLORIDE 0.9% FLUSH
3.0000 mL | Freq: Two times a day (BID) | INTRAVENOUS | Status: DC
  Administered 2022-07-20 – 2022-07-22 (×4): 3 mL via INTRAVENOUS

## 2022-07-20 MED ORDER — EMPAGLIFLOZIN 25 MG PO TABS
25.0000 mg | ORAL_TABLET | Freq: Every day | ORAL | Status: DC
  Administered 2022-07-20 – 2022-07-22 (×3): 25 mg via ORAL
  Filled 2022-07-20 (×3): qty 1

## 2022-07-20 MED ORDER — ONDANSETRON HCL 4 MG/2ML IJ SOLN: 4.0000 mg | Freq: Four times a day (QID) | INTRAMUSCULAR | Status: DC | PRN

## 2022-07-20 MED ORDER — AMLODIPINE BESYLATE 5 MG PO TABS
2.5000 mg | ORAL_TABLET | Freq: Every day | ORAL | Status: DC
  Administered 2022-07-20 – 2022-07-21 (×2): 2.5 mg via ORAL
  Filled 2022-07-20 (×2): qty 1

## 2022-07-20 MED ORDER — ACETAMINOPHEN 650 MG RE SUPP: 650.0000 mg | Freq: Four times a day (QID) | RECTAL | Status: DC | PRN

## 2022-07-20 MED ORDER — LAMOTRIGINE 25 MG PO TABS
100.0000 mg | ORAL_TABLET | Freq: Every day | ORAL | Status: DC
  Administered 2022-07-20 – 2022-07-21 (×2): 100 mg via ORAL
  Filled 2022-07-20 (×2): qty 4

## 2022-07-20 MED ORDER — ENOXAPARIN SODIUM 40 MG/0.4ML IJ SOSY
40.0000 mg | PREFILLED_SYRINGE | INTRAMUSCULAR | Status: DC
  Administered 2022-07-20 – 2022-07-21 (×2): 40 mg via SUBCUTANEOUS
  Filled 2022-07-20 (×2): qty 0.4

## 2022-07-20 NOTE — Progress Notes (Signed)
Patient has become very argumentative after ambulating in hallway with PT.  Patient states he is ready to "walk up out of this damn place" and is not going to "sit around twiddling my thumbs for hours waiting for some doctor to show up".  Physical therapist explained to patient that there are valid safety concerns for him being discharged home alone considering he has fallen multiple times at home.  Patient states "I dont give a damn if I fall and break a bone.  Im getting out of here".  Patient currently in recliner with chair alarm on.

## 2022-07-20 NOTE — ED Notes (Signed)
Second ambulatory trial, patient was not able to get up without help and appeared very unstable on his feet. Patient state he feels like he's going to fall. MD notified by secure chat.

## 2022-07-20 NOTE — TOC Initial Note (Addendum)
Transition of Care [facility]) - Initial/Assessment Note    Patient Details  Name: Dwayne Vargas MRN: [id number] Date of Birth: [birth year]  [facility] of [facility]) CM/SW Contact:    Janae Bridgeman, RN Phone Number: 07/20/2022, 3:44 PM  Clinical Narrative:                 CM met with the patient and daughter, Drenda Freeze at the bedside to discuss TOC needs.  The patient's daughter states that the patient lives alone and has been falling at home - She states that she feels that the patient has been dizzy and falling due to two new medications that were prescribed to the patient on this past Monday by his psychiatrist - Restoril and oxycarbazepine.  The patient's daughter is a Teacher, early years/pre and did not want the patient placed on these medications and feels that these medications may be the culprit to patient's frequent falls.  Medicare Observation letter provided to the daughter.  I asked that the attending physician call and speak with the patient's daughter and patient by phone to discuss patient's needs as requested by the daughter.  The patient has RW and shower chair at home.  The patient's daughter states that patient would benefit from SNF placement.  MD to follow up with the family.  CM will placement SNF workup for have MSW follow up over the weekend regarding SNF work up, bed offers.  FL2 was completed and the patient was faxed out in the hub for bed offers.  I asked that [place], MSW follow up to obtain PASRR for the patient since his PASRR was expired and previous one was an E PASSR likely due to patient's medical history/bipolar disorder.  Expected Discharge Plan: Skilled Nursing Facility Barriers to Discharge: Continued Medical Work up   Patient Goals and CMS Choice   CMS Medicare.gov Compare Post Acute Care list provided to:: Patient Represenative (must comment) (daughter, Lynden Ang is at the bedside) Choice offered to / list presented to : Patient, Adult Children [place]  ownership interest in [facility].provided to:: Adult Children    Expected Discharge Plan and Services   Discharge Planning Services: CM Consult Post Acute Care Choice: Skilled Nursing Facility Living arrangements for the past 2 months: Apartment                                      Prior Living Arrangements/Services Living arrangements for the past 2 months: Apartment Lives with:: Self Patient language and need for interpreter reviewed:: Yes Do you feel safe going back to the place where you live?: Yes      Need for Family Participation in Patient Care: Yes (Comment) Care giver support system in place?: Yes (comment) Current home services: DME Criminal Activity/Legal Involvement Pertinent to Current Situation/Hospitalization: No - Comment as needed  Activities of Daily Living Home Assistive Devices/Equipment: Eyeglasses ADL Screening (condition at time of admission) Patient's cognitive ability adequate to safely complete daily activities?: Yes Is the patient deaf or have difficulty hearing?: Yes Does the patient have difficulty seeing, even when wearing glasses/contacts?: No Does the patient have difficulty concentrating, remembering, or making decisions?: No Patient able to express need for assistance with ADLs?: Yes Does the patient have difficulty dressing or bathing?: No Independently performs ADLs?: Yes (appropriate for developmental age) Does the patient have difficulty walking or climbing stairs?: No Weakness of Legs: None Weakness of Arms/Hands: None  Permission Sought/Granted Permission sought to share information with : Case Manager, Family Supports, Oceanographer granted to share information with : Yes, Verbal Permission Granted     Permission granted to share info w AGENCY: SNF facility for placement  Permission granted to share info w Relationship: daughter - Drenda Freeze [facility]     Emotional  Assessment Appearance:: Appears stated age Attitude/Demeanor/Rapport: Gracious Affect (typically observed): Accepting Orientation: : Oriented to Self, Oriented to Place Alcohol / Substance Use: Not Applicable Psych Involvement: No (comment)  Admission diagnosis:  Vertigo [R42] Normocytic anemia [D64.9] Unable to walk [R26.2] Frequent falls [R29.6] Elevated random blood glucose level [R73.09] Patient Active Problem List   Diagnosis Date Noted   Frequent falls 07/20/2022   Chest pain of uncertain etiology 01/11/2020   Hypertensive urgency    Atypical chest pain    Bilateral carotid artery stenosis    Pure hypercholesterolemia    Osteoarthritis of right hip 09/01/2017   Hyperlipidemia associated with type 2 diabetes mellitus (HCC) 09/01/2017   Bipolar disorder (HCC) 09/01/2017   Uncontrolled type 2 diabetes mellitus with hyperglycemia, without long-term current use of insulin (HCC) 04/17/2016   Diabetic peripheral neuropathy associated with type 2 diabetes mellitus (HCC) 04/17/2016   BPH (benign prostatic hyperplasia) 07/22/2015   Empyema (HCC) 08/12/2014   Diabetes mellitus without complication (HCC) 08/10/2014   Community acquired pneumonia 08/10/2014   Pleural effusion 08/10/2014   HTN (hypertension) 08/10/2014   Pneumonia 08/10/2014   CAP (community acquired pneumonia) 08/10/2014   PCP:  Amalia Greenhouse, DO Pharmacy:   [facility] [address], [place] - [address] [place] [address] Phone: [phone] Fax: [phone]     Social Determinants of Health (SDOH) Social History: SDOH Screenings   Food Insecurity: No Food Insecurity (07/20/2022)  Housing: Low Risk  (07/20/2022)  Transportation Needs: No Transportation Needs (07/20/2022)  Utilities: Not At Risk (07/20/2022)  Tobacco Use: Medium Risk (07/19/2022)   SDOH Interventions:     Readmission Risk Interventions     No data to display

## 2022-07-20 NOTE — [place]2 (Signed)
[place] MEDICAID FL2 LEVEL OF CARE FORM     IDENTIFICATION  Patient Name: Eshawn Ohagan Birthdate: [birth year] Sex: male Admission Date (Current Location): 07/19/2022  [facility] and [place] Number:  Producer, television/film/video and Address:  [facility], [address] [place], [place] [address]      Provider Number: [id number]  Attending Physician Name and Address:  Clydie Braun, MD  Relative Name and Phone Number:  Cheri Kearns - [phone]    Current Level of Care: Hospital Recommended Level of Care: Skilled Nursing Facility Prior Approval Number:    Date Approved/Denied:   PASRR Number: pending  Discharge Plan: SNF    Current Diagnoses: Patient Active Problem List   Diagnosis Date Noted   Frequent falls 07/20/2022   Chest pain of uncertain etiology 01/11/2020   Hypertensive urgency    Atypical chest pain    Bilateral carotid artery stenosis    Pure hypercholesterolemia    Osteoarthritis of right hip 09/01/2017   Hyperlipidemia associated with type 2 diabetes mellitus (HCC) 09/01/2017   Bipolar disorder (HCC) 09/01/2017   Uncontrolled type 2 diabetes mellitus with hyperglycemia, without long-term current use of insulin (HCC) 04/17/2016   Diabetic peripheral neuropathy associated with type 2 diabetes mellitus (HCC) 04/17/2016   BPH (benign prostatic hyperplasia) 07/22/2015   Empyema (HCC) 08/12/2014   Diabetes mellitus without complication (HCC) 08/10/2014   Community acquired pneumonia 08/10/2014   Pleural effusion 08/10/2014   HTN (hypertension) 08/10/2014   Pneumonia 08/10/2014   CAP (community acquired pneumonia) 08/10/2014    Orientation RESPIRATION BLADDER Height & Weight     Self, Place  Normal External catheter Weight: 86.2 kg Height:  5\' 11"  (180.3 cm)  BEHAVIORAL SYMPTOMS/MOOD NEUROLOGICAL BOWEL NUTRITION STATUS      Continent Diet  AMBULATORY STATUS COMMUNICATION OF NEEDS Skin   Extensive Assist Verbally Skin abrasions                        Personal Care Assistance Level of Assistance  Bathing, Feeding, Dressing Bathing Assistance: Limited assistance Feeding assistance: Independent Dressing Assistance: Limited assistance     Functional Limitations Info  Sight, Hearing, Speech Sight Info: Impaired (wears glasses, blind in Left Eye) Hearing Info: Adequate Speech Info: Adequate    SPECIAL CARE FACTORS FREQUENCY  PT (By licensed PT), OT (By licensed OT)     PT Frequency: 5x per week OT Frequency: 5 x per week            Contractures Contractures Info: Not present    Additional Factors Info  Code Status, Allergies, Psychotropic Code Status Info: Full code Allergies Info: Aspirin, Contrast, Fentanyl, morphine sulfate, oxycontin, diazepam, shellfish Psychotropic Info: Lamictal, Trazodone         Current Medications (07/20/2022):  This is the current hospital active medication list Current Facility-Administered Medications  Medication Dose Route Frequency Provider Last Rate Last Admin   acetaminophen (TYLENOL) tablet 650 mg  650 mg Oral Q6H PRN Clydie Braun, MD       Or   acetaminophen (TYLENOL) suppository 650 mg  650 mg Rectal Q6H PRN Madelyn Flavors A, MD       acidophilus (RISAQUAD) capsule 1 capsule  1 capsule Oral Daily Smith, Rondell A, MD       albuterol (PROVENTIL) (2.5 MG/3ML) 0.083% nebulizer solution 2.5 mg  2.5 mg Nebulization Q6H PRN Smith, Rondell A, MD       amLODipine (NORVASC) tablet 2.5 mg  2.5 mg Oral Daily Katrinka Blazing, Rondell A, MD       empagliflozin (JARDIANCE) tablet 25 mg  25 mg Oral Daily Smith, Rondell A, MD       enoxaparin (LOVENOX) injection 40 mg  40 mg Subcutaneous Q24H Smith, Rondell A, MD       hydrALAZINE (APRESOLINE) injection 10 mg  10 mg Intravenous Q4H PRN Smith, Rondell A, MD       irbesartan (AVAPRO) tablet 300 mg  300 mg Oral Daily Smith, Rondell A, MD       lamoTRIgine (LAMICTAL) tablet 100 mg  100 mg Oral QHS Smith, Rondell A, MD        ondansetron (ZOFRAN) tablet 4 mg  4 mg Oral Q6H PRN Madelyn Flavors A, MD       Or   ondansetron (ZOFRAN) injection 4 mg  4 mg Intravenous Q6H PRN Smith, Rondell A, MD       rosuvastatin (CRESTOR) tablet 10 mg  10 mg Oral Daily Smith, Rondell A, MD   10 mg at 07/20/22 1540   sodium chloride flush (NS) 0.9 % injection 3 mL  3 mL Intravenous Q12H Smith, Rondell A, MD       traZODone (DESYREL) tablet 100 mg  100 mg Oral QHS Clydie Braun, MD         Discharge Medications: Please see discharge summary for a list of discharge medications.  Relevant Imaging Results:  Relevant Lab Results:   Additional Information SS# [id number]  Janae Bridgeman, RN

## 2022-07-20 NOTE — ED Notes (Signed)
Patient unable to sit up on his own. After assistance getting into a standing position patient was unsteady and nearly fell. MD notified.

## 2022-07-20 NOTE — H&P (Addendum)
History and Physical    Patient: Dwayne Vargas ZOX:[id number] DOB: [birth year] DOA: 07/19/2022 DOS: the patient was seen and examined on 07/20/2022 PCP: Amalia Greenhouse, DO  Patient coming from: Home via EMS  Chief Complaint:  Chief Complaint  Patient presents with   Fall    Patient to ED via EMS with complaint of fall x 2 tonight. Patient told EMS he didn't remember falling a second time. Patient denies taking blood thinners. Patient denies injury from falling tonight.    HPI: Dwayne Vargas is a 77 y.o. male with medical history significant of hypertension, hyperlipidemia, diabetes mellitus type 2, CKD stage IIIa, bipolar 1 disorder, anxiety and depression, hard of hearing, blind in left eye, penile cancer s/p excision, and BPH who presents after having several falls at home.  At baseline patient states that he lives alone and normally walks with need of assistance.  Her last couple of days he states that he had been feeling lightheaded whenever getting up to ambulate.  He does not feel like the room is spinning around him.  Denies sustaining significant injury with the falls.  He has not had any significant nausea, vomiting, abdominal pain, diarrhea, or dysuria symptoms.  He reports that he has a court date to go to and that he needs to leave the hospital.  He denies any significant history of tobacco or alcohol use.  Patient also reports complaining of significant itching of his back for which he had intermittently been being placed on prednisone for treatment, but has stopped taking medication due to it keeping him awake at night.   Additional history is obtained from the patient's daughter when she arrived to the hospital.  She confirmed that normally at baseline patient is able to ambulate without need of assistance.  She makes note that he recently had a procedure with placement of penile implant and does not have a Foley catheter normally in place.  Makes note that he had recently seen his psychiatrist  Dr. Archer Asa who had started him on 15 mg of Restoril at night due to issues with sleeping as well as oxcarbazepine 600 mg twice daily.  Patient's daughter notes that something similar to this a couple months ago where his lamotrigine was increased and he was having dizziness and falls.   In the the emergency department patient was seen to be afebrile with blood pressures 144/70-163/85, and all other vital signs maintained.  Labs noted hemoglobin 10.8 which is down from prior of 11.9, and all other labs appear relatively near patient's baseline. MRI of the brain showed no acute abnormality with chronic small vessel ischemia and volume loss.  Patient has been given meclizine and Ativan.  2 attempts have been made to ambulate the patient which were unsuccessful.    Review of Systems: As mentioned in the history of present illness. All other systems reviewed and are negative. Past Medical History:  Diagnosis Date   Anxiety    Benign localized prostatic hyperplasia with lower urinary tract symptoms (LUTS)    Bipolar 1 disorder (HCC)    Complication of anesthesia    limited neck motion due to cervical spine surgery   Depression    ED (erectile dysfunction) of organic origin    History of penile cancer    s/p  excision of cancer 2013   HOH (hard of hearing)    left ear   Hypertension    Irritable bowel    intermittant diarrhea  secondary to antibiotics   Legally blind  in left eye, as defined in [place]    Stricture, prostate    urethra   Type 2 diabetes mellitus (HCC)    Weak urinary stream    intermittant   Past Surgical History:  Procedure Laterality Date   CATARACT EXTRACTION W/ INTRAOCULAR LENS  IMPLANT, BILATERAL  2007   CERVICAL FUSION  05/ 2016   [facility]   C2 -- C4   CHOLECYSTECTOMY  2013  approx   EXCISION PENILE CANCER  2013   NASAL SEPTUM SURGERY  x2  last one 1990's   SHOULDER ARTHROSCOPY WITH ROTATOR CUFF REPAIR Left 1990's   TOTAL KNEE ARTHROPLASTY Bilateral left 2007/   right and revision in 2009   TRANSURETHRAL INCISION OF PROSTATE N/A 11/21/2015   Procedure: TRANSURETHRAL INCISION OF THE PROSTATE (TUIP);  Surgeon: Hildred Laser, MD;  [facility];  Service: Urology;  Laterality: N/A;   TRANSURETHRAL RESECTION OF PROSTATE N/A 07/22/2015   Procedure: TRANSURETHRAL RESECTION OF THE PROSTATE ;  Surgeon: Hildred Laser, MD;  Location: [facility];  Service: Urology;  Laterality: N/A;   VIDEO ASSISTED THORACOSCOPY (VATS)/THOROCOTOMY Left 08/12/2014   Procedure: VIDEO ASSISTED THORACOSCOPY (VATS)/THOROCOTOMY, DRAINAGE OF EMPYEMA;  Surgeon: Alleen Borne, MD;  Location: [facility] OR;  Service: Thoracic;  Laterality: Left;   Social History:  reports that he quit smoking about 5 years ago. His smoking use included cigarettes. He has a 53.00 pack-year smoking history. He has never used smokeless tobacco. He reports that he does not drink alcohol and does not use drugs.  Allergies  Allergen Reactions   Aspirin Other (See Comments)    GI upset   Contrast Media [Iodinated Contrast Media] Hives and Swelling   Fentanyl     Pt wishes not to have   Morphine And Codeine Other (See Comments)    Tremors    Oxycontin [Oxycodone Hcl] Other (See Comments)    "pt does not wish to have"   Shellfish Allergy Hives    All fish and shellfish w/ iodine   Diazepam Other (See Comments)    Patient does not want to use    Family History  Problem Relation Age of Onset   Diabetes Cousin     Prior to Admission medications   Medication Sig Start Date End Date Taking? Authorizing Provider  amLODipine (NORVASC) 5 MG tablet Take 0.5 tablets (2.5 mg total) by mouth daily. Patient taking differently: Take 5 mg by mouth daily. 01/05/22   Carlos Levering, NP  Bacillus Coagulans-Inulin (PROBIOTIC) 1-250 BILLION-MG CAPS Take by mouth.    [provider]  lamoTRIgine (LAMICTAL) 200 MG tablet Take 100 mg by mouth at bedtime. 11/24/21   [provider]   rosuvastatin (CRESTOR) 10 MG tablet Take 1 tablet (10 mg total) by mouth daily. 01/05/22   Carlos Levering, NP  traZODone (DESYREL) 50 MG tablet Take 100 mg by mouth at bedtime.     [provider]  valsartan (DIOVAN) 320 MG tablet Take 1 tablet (320 mg total) by mouth daily. 01/05/22   Carlos Levering, NP    Physical Exam: Vitals:   07/20/22 0000 07/20/22 0230 07/20/22 0400 07/20/22 0601  BP: (!) 154/77 (!) 154/125 (!) 163/85 (!) 154/71  Pulse: 78 76 74 77  Resp: (!) 21 19 16 17   Temp:    98.5 F (36.9 C)  TempSrc:    Oral  SpO2: 100% 98% 92% 99%  Weight:      Height:       Constitutional:  Elderly male who appears to be in no acute distress Eyes: Blind in the left eye.  Lacerations to the left side of the face. ENMT: Mucous membranes are moist.  Fair dentition.  Hard of hearing. Neck: normal, supple Respiratory: clear to auscultation bilaterally, no wheezing, no crackles. Normal respiratory effort.   Cardiovascular: Regular rate and rhythm, no murmurs / rubs / gallops. No extremity edema. 2+ pedal pulses. No carotid bruits.  Abdomen: no tenderness, no masses palpated.   Condom catheter in place. Musculoskeletal: no clubbing / cyanosis. No joint deformity upper and lower extremities. Good ROM, no contractures. Normal muscle tone.  Skin: no rashes, lesions, ulcers. No induration Neurologic: CN 2-12 grossly intact.  Some horizontal nystagmus appreciated when trying to get the patient to track.  Able to move all extremities. Psychiatric: Alert and oriented x 3.  Intermittently and seems to get upset as he wants to be able to go back home.  Data Reviewed:  EKG reveals sinus rhythm at 78 bpm with premature ventricular complexes.  Reviewed labs, imaging and pertinent records as noted above in the HPI.  Assessment and Plan:  Frequent falls secondary to suspected BPPV Patient presents after having multiple falls at home with complaints of dizziness.  Denied any injury  with falls.  MRI of the brain was negative for any acute abnormality.  Suspect symptoms possibly related to recent changes in medications as noted by patient's daughter. -Admit to medical telemetry bed -Fall precautions -Check orthostatic vital signs once able -Check vitamin B12 and TSH -Check urinalysis -Continued Restoril and oxcarbazepine  -Vestibular PT evaluation consult -PT/OT to evaluation and treat -Follow-up telemetry to check for possibility of arrhythmias due to patient's symptoms -Transitions of care consulted for possible need of placement  Hypochromic anemia Acute on chronic.  On admission hemoglobin 10.8 with MCH 25.7 and RDW 16.2.  Hemoglobin had been 12 previously earlier this year. -Check iron studies and repeat CBC today  Essential hypertension Blood pressures noted to initially be elevated up to 144/75-163/85.  Home blood pressure regimen appears to include valsartan 320 mg daily and amlodipine 2.5  mg daily -Continue home blood pressure regimen with pharmacy substitution  Bipolar disorder Patient has a history of bipolar disorder.  Oxcarbazepine had recently been started in addition to patient already being on Lamictal 200 mg daily. -Discontinue oxcarbazepine  -Continue Lamictal  CKD stage IIIa Stable.  Creatinine 1.24 which appears around patient's previous baseline. -Continue to monitor  Hyperlipidemia -Continue Crestor  Atopic dermatitis Patient complains of severe itching around his back for which he had been placed on prednisone intermittently, but no longer taking this medication as it was keeping him up at night. -Hydrocortisone cream as needed to affected areas  Insomnia Patient chronically deals with difficulty sleeping.  His psychiatrist had recently started Restoril, but daughter states this should be discontinued as she feels like it likely is contributing to his falls. -Continue trazodone -Restoril discontinued  BPH Patient with prior  history of TURP done in 2017. -Continue Flomax  DVT prophylaxis: Lovenox Advance Care Planning:   Code Status: Full Code CODE STATUS confirmed with daughter at bedside who has healthcare power of attorney.  Consults: None  Family Communication: Daughter updated at bedside  Severity of Illness: The appropriate patient status for this patient is OBSERVATION. Observation status is judged to be reasonable and necessary in order to provide the required intensity of service to ensure the patient's safety. The patient's presenting symptoms, physical exam findings, and initial radiographic and  laboratory data in the context of their medical condition is felt to place them at decreased risk for further clinical deterioration. Furthermore, it is anticipated that the patient will be medically stable for discharge from the hospital within 2 midnights of admission.   Author: Clydie Braun, MD 07/20/2022 7:10 AM  For on call review [email or web address].

## 2022-07-20 NOTE — Evaluation (Addendum)
Physical Therapy Evaluation Patient Details Name: Dwayne Vargas MRN: [id number] DOB: February 08, 1946 Today's Date: 07/20/2022  History of Present Illness  Pt admitted 5/23 brought in by ambulance after having several falls. He does not remember the falls.  However, he does endorse multiple episodes of vertigo over the last several weeks and has had several falls including last night.  He has suffered bruising to his arms and face.  He has had nausea with these episodes of vertigo. PMH: hypertension, diabetes, hyperlipidemia, blindness in left eye, bipolar disorder  Clinical Impression  Pt admitted with above diagnosis. Pt is intermittently confused. Pt also with poor awareness as pt is unsteady without RW and did better with RW however this PT still concerned he is at risk for falls even with RW. Pt states "he has equipment but he isnt going to use a RW". Discussed safety and pt started yelling stating that he needs to leave now as he has a court date to get to. Pt had been pleasant until PT mentioned that he had safety issues. PT did do a vestibular assessment however visual testing difficult as pt not cooperative. He is blind in left eye per pt however he wouldnt perform smooth pursuits or saccades to command.  Pt also with questionable left horizontal canal BPPV as well.  Did a treatment for BPPV.  Pt admits to multiple falls at home and states, "I will just keep dropping and rolling." Will follow acutely and recommend f/u and 24 hour care with pt at current level.  Pt currently with functional limitations due to the deficits listed below (see PT Problem List). Pt will benefit from acute skilled PT to increase their independence and safety with mobility to allow discharge.          Recommendations for follow up therapy are one component of a multi-disciplinary discharge planning process, led by the attending physician.  Recommendations may be updated based on patient status, additional functional criteria and  insurance authorization.  Follow Up Recommendations Can patient physically be transported by private vehicle: No     Assistance Recommended at Discharge Frequent or constant Supervision/Assistance  Patient can return home with the following  A lot of help with walking and/or transfers;A lot of help with bathing/dressing/bathroom;Assistance with cooking/housework;Assist for transportation;Help with stairs or ramp for entrance    Equipment Recommendations None recommended by PT  Recommendations for Other Services       Functional Status Assessment Patient has had a recent decline in their functional status and demonstrates the ability to make significant improvements in function in a reasonable and predictable amount of time.     Precautions / Restrictions Precautions Precautions: Fall Restrictions Weight Bearing Restrictions: No      Mobility  Bed Mobility Overal bed mobility: Needs Assistance Bed Mobility: Rolling, Sidelying to Sit Rolling: Min assist Sidelying to sit: Min assist       General bed mobility comments: Assessed for BPPV and appears to have left horizontal canal cupulolithiasis and attempted to treat with quick BBQ roll as pt was difficult to assess and treat as he is confused. Pt needed assist to EOB as pt trying to pull up on therapist.    Transfers Overall transfer level: Needs assistance Equipment used: Rolling walker (2 wheels) Transfers: Sit to/from Stand Sit to Stand: Mod assist           General transfer comment: Needed mod assist to stand to feet from bed surface with pt losing balance backwards and sitting back on  bed a few times until he could get his balance once he widened BOS. Pt also initially trying to pull up on therapist and once he pushed up from bed, did better.    Ambulation/Gait Ambulation/Gait assistance: Min assist Gait Distance (Feet): 125 Feet Assistive device: Rolling walker (2 wheels) Gait Pattern/deviations: Step-through  pattern, Decreased stride length, Trunk flexed, Wide base of support   Gait velocity interpretation: 1.31 - 2.62 ft/sec, indicative of limited community ambulator   General Gait Details: Pt was able to progress ambulation wtih RW.  No LOB but needed cues to sequence steps and rW as well as to stay close to RW.  Pt also dragging right LE at times with gait.  Stairs            Wheelchair Mobility    Modified Rankin (Stroke Patients Only)       Balance Overall balance assessment: Needs assistance Sitting-balance support: No upper extremity supported, Feet supported Sitting balance-Leahy Scale: Fair     Standing balance support: Bilateral upper extremity supported, During functional activity, Reliant on assistive device for balance Standing balance-Leahy Scale: Poor Standing balance comment: relies on UE support for balance                             Pertinent Vitals/Pain Pain Assessment Pain Assessment: Faces Faces Pain Scale: Hurts even more Pain Location: head, knees Pain Descriptors / Indicators: Discomfort, Grimacing, Guarding Pain Intervention(s): Limited activity within patient's tolerance, Monitored during session, Repositioned    Home Living Family/patient expects to be discharged to:: Private residence Living Arrangements: Alone Available Help at Discharge: Family Type of Home: Apartment Home Access: Level entry         Home Equipment: Agricultural consultant (2 wheels);BSC/3in1;Tub bench Additional Comments: Pt is a retired Theatre manager.  Talked alot about this during session like he was still working.    Prior Function Prior Level of Function : Independent/Modified Independent;Driving;History of Falls (last six months)             Mobility Comments: inconsistent reports of PLOF and fall hx. pt reports typically independent without DME, though has had weekly falls due to legs "giving out" over the past 6-8 months ("there's so much metal in my  knees") ADLs Comments: indep, reports he had a fall in the tub/shower, drives     Hand Dominance   Dominant Hand: Right    Extremity/Trunk Assessment   Upper Extremity Assessment Upper Extremity Assessment: Defer to OT evaluation    Lower Extremity Assessment Lower Extremity Assessment: Generalized weakness    Cervical / Trunk Assessment Cervical / Trunk Assessment: Kyphotic  Communication   Communication: No difficulties  Cognition Arousal/Alertness: Awake/alert Behavior During Therapy: WFL for tasks assessed/performed Overall Cognitive Status: Impaired/Different from baseline Area of Impairment: Orientation, Following commands, Safety/judgement, Awareness, Problem solving                 Orientation Level: Place, Time, Situation (States its school, August 19, 2022)     Following Commands: Follows one step commands inconsistently, Follows one step commands with increased time Safety/Judgement: Decreased awareness of safety, Decreased awareness of deficits   Problem Solving: Difficulty sequencing, Requires verbal cues, Requires tactile cues, Decreased initiation, Slow processing          General Comments General comments (skin integrity, edema, etc.): Pt is intermittently confused. Pt also with poor awareness as he states he had his catheter PTA and it is a condom  catheter that I think was placed while here. Pt very unsteady without RW and did better with RW however still concerned he is at risk for falls even with RW. Pt states "he has equipment but he isnt going to use a RW". Discussed safety and pt started yelling stating that he needs to leave now as he has a court date to get to. Pt had been pleasant until PT mentioned that he had safety issues. I did do a vestibular assessment however visual testing difficult as pt not cooperative. He is blind in left eye per pt however he couldn't;wouldnt perform smooth pursuits or saccades to command.    Exercises      Assessment/Plan    PT Assessment Patient needs continued PT services  PT Problem List Decreased balance;Decreased activity tolerance;Decreased mobility;Decreased knowledge of use of DME;Decreased safety awareness;Decreased knowledge of precautions;Decreased cognition       PT Treatment Interventions DME instruction;Gait training;Functional mobility training;Therapeutic activities;Therapeutic exercise;Balance training;Patient/family education    PT Goals (Current goals can be found in the Care Plan section)  Acute Rehab PT Goals Patient Stated Goal: to get better PT Goal Formulation: With patient Time For Goal Achievement: 08/03/22 Potential to Achieve Goals: Good    Frequency Min 3X/week     Co-evaluation               AM-PAC PT "6 Clicks" Mobility  Outcome Measure Help needed turning from your back to your side while in a flat bed without using bedrails?: A Little Help needed moving from lying on your back to sitting on the side of a flat bed without using bedrails?: A Lot Help needed moving to and from a bed to a chair (including a wheelchair)?: A Lot Help needed standing up from a chair using your arms (e.g., wheelchair or bedside chair)?: A Lot Help needed to walk in hospital room?: A Little Help needed climbing 3-5 steps with a railing? : A Lot 6 Click Score: 14    End of Session Equipment Utilized During Treatment: Gait belt Activity Tolerance: Patient limited by fatigue Patient left: in chair;with call bell/phone within reach;with chair alarm set Nurse Communication: Mobility status PT Visit Diagnosis: Unsteadiness on feet (R26.81);Dizziness and giddiness (R42);BPPV BPPV - Right/Left : Left    Time: 1610-9604 PT Time Calculation (min) (ACUTE ONLY): 38 min   Charges:   PT Evaluation $PT Eval Moderate Complexity: 1 Mod PT Treatments $Gait Training: 8-22 mins $Canalith Rep Proc: 8-22 mins        Dwayne Vargas M,PT Acute Rehab Services [phone]   Dwayne Vargas 07/20/2022, 2:32 PM

## 2022-07-20 NOTE — Progress Notes (Signed)
Patient to 2W33 at this time

## 2022-07-20 NOTE — Care Management Obs Status (Cosign Needed)
MEDICARE OBSERVATION STATUS NOTIFICATION   Patient Details  Name: Dwayne Vargas MRN: [id number] Date of Birth: [birth year]   Medicare Observation Status Notification Given:  Yes    Janae Bridgeman, RN 07/20/2022, 3:20 PM

## 2022-07-20 NOTE — ED Notes (Signed)
ED TO INPATIENT HANDOFF REPORT  ED Nurse Name and Phone #: Virgilio Belling Name/Age/Gender Dwayne Vargas 77 y.o. male Room/Bed: 032C/032C  Code Status   Code Status: Prior  Home/SNF/Other Home Patient oriented to: self, place, time, and situation Is this baseline? Yes   Triage Complete: Triage complete  Chief Complaint Frequent falls [R29.6]  Triage Note No notes on file   Allergies Allergies  Allergen Reactions   Aspirin Other (See Comments)    GI upset   Contrast Media [Iodinated Contrast Media] Hives and Swelling   Fentanyl     Pt wishes not to have   Morphine And Codeine Other (See Comments)    Tremors    Oxycontin [Oxycodone Hcl] Other (See Comments)    "pt does not wish to have"   Shellfish Allergy Hives    All fish and shellfish w/ iodine   Diazepam Other (See Comments)    Patient does not want to use    Level of Care/Admitting Diagnosis ED Disposition     ED Disposition  Admit   Condition  --   Comment  Hospital Area: [facility] [[id number]]  Level of Care: Telemetry Medical [104]  May place patient in observation at [facility] or [facility] if equivalent level of care is available:: No  Covid Evaluation: Asymptomatic - no recent exposure (last 10 days) testing not required  Diagnosis: Frequent falls [697756]  Admitting Physician: Clydie Braun [[id number]]  Attending Physician: Clydie Braun [[id number]]          B Medical/Surgery History Past Medical History:  Diagnosis Date   Anxiety    Benign localized prostatic hyperplasia with lower urinary tract symptoms (LUTS)    Bipolar 1 disorder (HCC)    Complication of anesthesia    limited neck motion due to cervical spine surgery   Depression    ED (erectile dysfunction) of organic origin    History of penile cancer    s/p  excision of cancer 2013   HOH (hard of hearing)    left ear   Hypertension    Irritable bowel    intermittant diarrhea  secondary to antibiotics    Legally blind in left eye, as defined in [place]    Stricture, prostate    urethra   Type 2 diabetes mellitus (HCC)    Weak urinary stream    intermittant   Past Surgical History:  Procedure Laterality Date   CATARACT EXTRACTION W/ INTRAOCULAR LENS  IMPLANT, BILATERAL  2007   CERVICAL FUSION  05/ 2016   [facility]   C2 -- C4   CHOLECYSTECTOMY  2013  approx   EXCISION PENILE CANCER  2013   NASAL SEPTUM SURGERY  x2  last one 1990's   SHOULDER ARTHROSCOPY WITH ROTATOR CUFF REPAIR Left 1990's   TOTAL KNEE ARTHROPLASTY Bilateral left 2007/  right and revision in 2009   TRANSURETHRAL INCISION OF PROSTATE N/A 11/21/2015   Procedure: TRANSURETHRAL INCISION OF THE PROSTATE (TUIP);  Surgeon: Hildred Laser, MD;  Location: [facility];  Service: Urology;  Laterality: N/A;   TRANSURETHRAL RESECTION OF PROSTATE N/A 07/22/2015   Procedure: TRANSURETHRAL RESECTION OF THE PROSTATE ;  Surgeon: Hildred Laser, MD;  Location: [facility];  Service: Urology;  Laterality: N/A;   VIDEO ASSISTED THORACOSCOPY (VATS)/THOROCOTOMY Left 08/12/2014   Procedure: VIDEO ASSISTED THORACOSCOPY (VATS)/THOROCOTOMY, DRAINAGE OF EMPYEMA;  Surgeon: Alleen Borne, MD;  Location: [facility];  Service: Thoracic;  Laterality: Left;  A IV Location/Drains/Wounds Patient Lines/Drains/Airways Status     Active Line/Drains/Airways     None            Intake/Output Last 24 hours No intake or output data in the 24 hours ending 07/20/22 0731  Labs/Imaging Results for orders placed or performed during the hospital encounter of 07/19/22 (from the past 48 hour(s))  Comprehensive metabolic panel     Status: Abnormal   Collection Time: 07/20/22  2:32 AM  Result Value Ref Range   Sodium 137 135 - 145 mmol/L   Potassium 4.1 3.5 - 5.1 mmol/L   Chloride 104 98 - 111 mmol/L   CO2 22 22 - 32 mmol/L   Glucose, Bld 106 (H) 70 - 99 mg/dL    Comment: Glucose reference range applies only to samples taken after fasting for at  least 8 hours.   BUN 18 8 - 23 mg/dL   Creatinine, Ser 1.61 0.61 - 1.24 mg/dL   Calcium 9.4 8.9 - 09.6 mg/dL   Total Protein 6.1 (L) 6.5 - 8.1 g/dL   Albumin 3.1 (L) 3.5 - 5.0 g/dL   AST 18 15 - 41 U/L   ALT 23 0 - 44 U/L   Alkaline Phosphatase 74 38 - 126 U/L   Total Bilirubin 0.4 0.3 - 1.2 mg/dL   GFR, Estimated 60 (L) >60 mL/min    Comment: (NOTE) Calculated using the CKD-EPI Creatinine Equation (2021)    Anion gap 11 5 - 15    Comment: Performed at [facility], [address] [place], [place] [address]  CBC with Differential     Status: Abnormal   Collection Time: 07/20/22  2:32 AM  Result Value Ref Range   WBC 7.4 4.0 - 10.5 K/uL   RBC 4.21 (L) 4.22 - 5.81 MIL/uL   Hemoglobin 10.8 (L) 13.0 - 17.0 g/dL   HCT 98.1 (L) 19.1 - 47.8 %   MCV 82.7 80.0 - 100.0 fL   MCH 25.7 (L) 26.0 - 34.0 pg   MCHC 31.0 30.0 - 36.0 g/dL   RDW 29.5 (H) 62.1 - 30.8 %   Platelets 258 150 - 400 K/uL   nRBC 0.0 0.0 - 0.2 %   Neutrophils Relative % 69 %   Neutro Abs 5.1 1.7 - 7.7 K/uL   Lymphocytes Relative 14 %   Lymphs Abs 1.0 0.7 - 4.0 K/uL   Monocytes Relative 9 %   Monocytes Absolute 0.7 0.1 - 1.0 K/uL   Eosinophils Relative 4 %   Eosinophils Absolute 0.3 0.0 - 0.5 K/uL   Basophils Relative 1 %   Basophils Absolute 0.1 0.0 - 0.1 K/uL   Immature Granulocytes 3 %   Abs Immature Granulocytes 0.19 (H) 0.00 - 0.07 K/uL    Comment: Performed at [facility], [address], [place], [place] [address]  Magnesium     Status: None   Collection Time: 07/20/22  2:32 AM  Result Value Ref Range   Magnesium 2.2 1.7 - 2.4 mg/dL    Comment: Performed at [facility], [address] [place], [place] [address]   MR BRAIN WO CONTRAST  Result Date: 07/20/2022 CLINICAL DATA:  Acute neurologic deficit EXAM: MRI HEAD WITHOUT CONTRAST TECHNIQUE: Multiplanar, multiecho pulse sequences of the brain and surrounding structures were obtained without intravenous contrast. COMPARISON:   03/11/2022 FINDINGS: Brain: No acute infarct, mass effect or extra-axial collection. No acute or chronic hemorrhage. There is multifocal hyperintense T2-weighted signal within the  white matter. Generalized volume loss. The midline structures are normal. Vascular: Major flow voids are preserved. Skull and upper cervical spine: Normal calvarium and skull base. Visualized upper cervical spine and soft tissues are normal. Sinuses/Orbits:No paranasal sinus fluid levels or advanced mucosal thickening. No mastoid or middle ear effusion. Normal orbits. IMPRESSION: 1. No acute intracranial abnormality. 2. Findings of chronic small vessel ischemia and volume loss. Electronically Signed   By: Deatra Robinson M.D.   On: 07/20/2022 01:49    Pending Labs Unresulted Labs (From admission, onward)    None       Vitals/Pain Today's Vitals   07/20/22 0000 07/20/22 0230 07/20/22 0400 07/20/22 0601  BP: (!) 154/77 (!) 154/125 (!) 163/85 (!) 154/71  Pulse: 78 76 74 77  Resp: (!) 21 19 16 17   Temp:    98.5 F (36.9 C)  TempSrc:    Oral  SpO2: 100% 98% 92% 99%  Weight:      Height:      PainSc:        Isolation Precautions No active isolations  Medications Medications  meclizine (ANTIVERT) tablet 25 mg (25 mg Oral Given 07/20/22 0006)  LORazepam (ATIVAN) tablet 1 mg (1 mg Oral Given 07/20/22 0006)  enoxaparin (LOVENOX) injection 40 mg (has no administration in time range)    Mobility walks with device     Focused Assessments     R Recommendations: See Admitting Provider Note  Report given to:   Additional Notes:

## 2022-07-21 DIAGNOSIS — I952 Hypotension due to drugs: Secondary | ICD-10-CM | POA: Diagnosis present

## 2022-07-21 DIAGNOSIS — L209 Atopic dermatitis, unspecified: Secondary | ICD-10-CM | POA: Diagnosis present

## 2022-07-21 DIAGNOSIS — R2689 Other abnormalities of gait and mobility: Secondary | ICD-10-CM | POA: Diagnosis present

## 2022-07-21 DIAGNOSIS — N1831 Chronic kidney disease, stage 3a: Secondary | ICD-10-CM | POA: Diagnosis present

## 2022-07-21 DIAGNOSIS — Z87891 Personal history of nicotine dependence: Secondary | ICD-10-CM | POA: Diagnosis not present

## 2022-07-21 DIAGNOSIS — I6782 Cerebral ischemia: Secondary | ICD-10-CM | POA: Diagnosis present

## 2022-07-21 DIAGNOSIS — Z79899 Other long term (current) drug therapy: Secondary | ICD-10-CM | POA: Diagnosis not present

## 2022-07-21 DIAGNOSIS — Z8549 Personal history of malignant neoplasm of other male genital organs: Secondary | ICD-10-CM | POA: Diagnosis not present

## 2022-07-21 DIAGNOSIS — Z9049 Acquired absence of other specified parts of digestive tract: Secondary | ICD-10-CM | POA: Diagnosis not present

## 2022-07-21 DIAGNOSIS — T50915A Adverse effect of multiple unspecified drugs, medicaments and biological substances, initial encounter: Secondary | ICD-10-CM | POA: Diagnosis present

## 2022-07-21 DIAGNOSIS — N400 Enlarged prostate without lower urinary tract symptoms: Secondary | ICD-10-CM | POA: Diagnosis present

## 2022-07-21 DIAGNOSIS — Z7982 Long term (current) use of aspirin: Secondary | ICD-10-CM | POA: Diagnosis not present

## 2022-07-21 DIAGNOSIS — Z96653 Presence of artificial knee joint, bilateral: Secondary | ICD-10-CM | POA: Diagnosis present

## 2022-07-21 DIAGNOSIS — F319 Bipolar disorder, unspecified: Secondary | ICD-10-CM | POA: Diagnosis present

## 2022-07-21 DIAGNOSIS — H5462 Unqualified visual loss, left eye, normal vision right eye: Secondary | ICD-10-CM | POA: Diagnosis present

## 2022-07-21 DIAGNOSIS — R296 Repeated falls: Secondary | ICD-10-CM | POA: Diagnosis present

## 2022-07-21 DIAGNOSIS — E1122 Type 2 diabetes mellitus with diabetic chronic kidney disease: Secondary | ICD-10-CM | POA: Diagnosis present

## 2022-07-21 DIAGNOSIS — G4700 Insomnia, unspecified: Secondary | ICD-10-CM | POA: Diagnosis present

## 2022-07-21 DIAGNOSIS — F419 Anxiety disorder, unspecified: Secondary | ICD-10-CM | POA: Diagnosis present

## 2022-07-21 DIAGNOSIS — Y92009 Unspecified place in unspecified non-institutional (private) residence as the place of occurrence of the external cause: Secondary | ICD-10-CM | POA: Diagnosis not present

## 2022-07-21 DIAGNOSIS — I129 Hypertensive chronic kidney disease with stage 1 through stage 4 chronic kidney disease, or unspecified chronic kidney disease: Secondary | ICD-10-CM | POA: Diagnosis present

## 2022-07-21 DIAGNOSIS — Z602 Problems related to living alone: Secondary | ICD-10-CM | POA: Diagnosis present

## 2022-07-21 DIAGNOSIS — S01412A Laceration without foreign body of left cheek and temporomandibular area, initial encounter: Secondary | ICD-10-CM | POA: Diagnosis present

## 2022-07-21 DIAGNOSIS — D509 Iron deficiency anemia, unspecified: Secondary | ICD-10-CM | POA: Diagnosis present

## 2022-07-21 DIAGNOSIS — E785 Hyperlipidemia, unspecified: Secondary | ICD-10-CM | POA: Diagnosis present

## 2022-07-21 DIAGNOSIS — W19XXXA Unspecified fall, initial encounter: Secondary | ICD-10-CM | POA: Diagnosis present

## 2022-07-21 DIAGNOSIS — R42 Dizziness and giddiness: Secondary | ICD-10-CM | POA: Diagnosis present

## 2022-07-21 LAB — CBC
HCT: 33.6 % — ABNORMAL LOW (ref 39.0–52.0)
Hemoglobin: 10.6 g/dL — ABNORMAL LOW (ref 13.0–17.0)
MCH: 25.2 pg — ABNORMAL LOW (ref 26.0–34.0)
MCHC: 31.5 g/dL (ref 30.0–36.0)
MCV: 79.8 fL — ABNORMAL LOW (ref 80.0–100.0)
Platelets: 276 10*3/uL (ref 150–400)
RBC: 4.21 MIL/uL — ABNORMAL LOW (ref 4.22–5.81)
RDW: 16.1 % — ABNORMAL HIGH (ref 11.5–15.5)
WBC: 7.9 10*3/uL (ref 4.0–10.5)
nRBC: 0 % (ref 0.0–0.2)

## 2022-07-21 LAB — BASIC METABOLIC PANEL
Anion gap: 10 (ref 5–15)
BUN: 17 mg/dL (ref 8–23)
CO2: 22 mmol/L (ref 22–32)
Calcium: 9.7 mg/dL (ref 8.9–10.3)
Chloride: 104 mmol/L (ref 98–111)
Creatinine, Ser: 1.09 mg/dL (ref 0.61–1.24)
GFR, Estimated: >60 mL/min (ref >60)
Glucose, Bld: 121 mg/dL — ABNORMAL HIGH (ref 70–99)
Potassium: 4.1 mmol/L (ref 3.5–5.1)
Sodium: 136 mmol/L (ref 135–145)

## 2022-07-21 MED ORDER — VITAMIN B-12 1000 MCG PO TABS
1000.0000 ug | ORAL_TABLET | Freq: Every day | ORAL | Status: DC
  Administered 2022-07-21 – 2022-07-22 (×2): 1000 ug via ORAL
  Filled 2022-07-21 (×2): qty 1

## 2022-07-21 MED ORDER — FERROUS SULFATE 325 (65 FE) MG PO TABS
325.0000 mg | ORAL_TABLET | Freq: Every day | ORAL | Status: DC
  Administered 2022-07-22: 325 mg via ORAL
  Filled 2022-07-21: qty 1

## 2022-07-21 MED ORDER — SODIUM CHLORIDE 0.9 % IV SOLN: INTRAVENOUS | Status: DC

## 2022-07-21 NOTE — TOC Progression Note (Signed)
Transition of Care [facility]) - Progression Note    Patient Details  Name: Dwayne Vargas MRN: [id number] Date of Birth: 01/02/1946  Transition of Care [facility] CM/SW Contact  Jimmy Picket, [place] Phone Number: 07/21/2022, 10:50 AM  Clinical Narrative:     Pt has no bed offers at this time. CSW expanded Snf search.   Expected Discharge Plan: Skilled Nursing Facility Barriers to Discharge: Continued Medical Work up  Expected Discharge Plan and Services   Discharge Planning Services: CM Consult Post Acute Care Choice: Skilled Nursing Facility Living arrangements for the past 2 months: Apartment                                       Social Determinants of Health (SDOH) Interventions SDOH Screenings   Food Insecurity: No Food Insecurity (07/20/2022)  Housing: Low Risk  (07/20/2022)  Transportation Needs: No Transportation Needs (07/20/2022)  Utilities: Not At Risk (07/20/2022)  Tobacco Use: Medium Risk (07/19/2022)    Readmission Risk Interventions     No data to display

## 2022-07-21 NOTE — Evaluation (Signed)
Occupational Therapy Evaluation Patient Details Name: Dwayne Vargas MRN: [id number] DOB: [birth year] Today's Date: 07/21/2022   History of Present Illness Pt admitted 5/23 brought in by ambulance after having several falls. He does not remember the falls.  However, he does endorse multiple episodes of vertigo over the last several weeks and has had several falls including last night.  He has suffered bruising to his arms and face.  He has had nausea with these episodes of vertigo. PMH: hypertension, diabetes, hyperlipidemia, blindness in left eye, bipolar disorder   Clinical Impression   PTA, pt independent and lived alone. Upon eval, pt poor historian regarding events leading to admission with daughter reporting inaccurate reports. Pt perseverative and preoccupied on taking care of things unrelated to acute issues. Pt presenting with poor awareness, problem solving, safety, and balance. Scoring a 12 on the short blessed test indicating significant impairment. Performing ADL at min guard-min A level. Tangential and impulsive throughout. Will continue to follow and recommending HHOT; if PT updates d/c recommendation, will need OP OT due to significant cognitive decline.      Recommendations for follow up therapy are one component of a multi-disciplinary discharge planning process, led by the attending physician.  Recommendations may be updated based on patient status, additional functional criteria and insurance authorization.   Assistance Recommended at Discharge Intermittent Supervision/Assistance  Patient can return home with the following A little help with walking and/or transfers;A little help with bathing/dressing/bathroom;Help with stairs or ramp for entrance;Assist for transportation;Direct supervision/assist for financial management;Direct supervision/assist for medications management;Assistance with cooking/housework    Functional Status Assessment  Patient has had a recent decline in their  functional status and demonstrates the ability to make significant improvements in function in a reasonable and predictable amount of time.  Equipment Recommendations  None recommended by OT    Recommendations for Other Services Speech consult (cog)     Precautions / Restrictions Precautions Precautions: Fall Restrictions Weight Bearing Restrictions: No      Mobility Bed Mobility Overal bed mobility: Needs Assistance Bed Mobility: Supine to Sit, Sit to Supine     Supine to sit: Supervision Sit to supine: Supervision   General bed mobility comments: for safety    Transfers Overall transfer level: Needs assistance Equipment used: Rolling walker (2 wheels) Transfers: Sit to/from Stand Sit to Stand: Min guard           General transfer comment: Min guard A for safety      Balance Overall balance assessment: Needs assistance Sitting-balance support: No upper extremity supported, Feet supported Sitting balance-Leahy Scale: Fair     Standing balance support: During functional activity Standing balance-Leahy Scale: Fair Standing balance comment: statically                           ADL either performed or assessed with clinical judgement   ADL Overall ADL's : Needs assistance/impaired Eating/Feeding: Modified independent   Grooming: Min guard;Standing   Upper Body Bathing: Set up;Sitting   Lower Body Bathing: Min guard;Sit to/from stand   Upper Body Dressing : Set up;Sitting   Lower Body Dressing: Min guard;Sit to/from stand   Toilet Transfer: Min guard;Ambulation;Minimal assistance           Functional mobility during ADLs: Min guard;Minimal assistance General ADL Comments: Poor awareness and safety. Pt denying dizziness     Vision Baseline Vision/History: 1 Wears glasses Ability to See in Adequate Light: 0 Adequate Patient Visual Report: Other (comment) (  L eye blindness but daughter present and stating he does not have L eye  blindness) Vision Assessment?: Vision impaired- to be further tested in functional context Additional Comments: Pt with inconsistent report     Perception     Praxis      Pertinent Vitals/Pain Pain Assessment Pain Assessment: Faces Faces Pain Scale: Hurts a little bit Pain Location: knees Pain Descriptors / Indicators: Discomfort, Grimacing, Guarding Pain Intervention(s): Limited activity within patient's tolerance, Monitored during session     Hand Dominance Right   Extremity/Trunk Assessment Upper Extremity Assessment Upper Extremity Assessment: Generalized weakness   Lower Extremity Assessment Lower Extremity Assessment: Defer to PT evaluation   Cervical / Trunk Assessment Cervical / Trunk Assessment: Kyphotic   Communication Communication Communication: No difficulties   Cognition Arousal/Alertness: Awake/alert Behavior During Therapy: WFL for tasks assessed/performed Overall Cognitive Status: Impaired/Different from baseline Area of Impairment: Following commands, Safety/judgement, Awareness, Problem solving, Memory                     Memory: Decreased short-term memory Following Commands: Follows one step commands inconsistently, Follows one step commands with increased time (requires cues to follow commands) Safety/Judgement: Decreased awareness of safety, Decreased awareness of deficits Awareness: Emergent, Intellectual Problem Solving: Difficulty sequencing, Requires verbal cues, Requires tactile cues, Decreased initiation, Slow processing General Comments: Pt scoring a 10 on the short blessed test indicating impairment consistent with dementia. Max difficulty with stating months in reverse order as well as with delayed memory recall. Pt with max tangential thought/conversation during session. Max concerns about things that need to be taken care of at home, but daughter shaking head no when pt reporting concerns such as court date. Pt likely poor  historian     General Comments  intermittently confused. Max difficulty with awareness, safety, and processing. Daughter present and reporting little history regarding events leading up to admission are true. Daughter reports that pt was provided too high dose medication Monday leading to this which has also happened one time in the past.    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      Prior Functioning/Environment Prior Level of Function : Independent/Modified Independent;Driving;History of Falls (last six months)             Mobility Comments: No AD ADLs Comments: indep, reports he had a fall in the tub/shower, drives        OT Problem List: Decreased strength;Impaired balance (sitting and/or standing);Decreased cognition;Decreased safety awareness      OT Treatment/Interventions: Self-care/ADL training;DME and/or AE instruction;Patient/family education;Balance training;Therapeutic activities    OT Goals(Current goals can be found in the care plan section) Acute Rehab OT Goals Patient Stated Goal: get out of hospital OT Goal Formulation: With patient Time For Goal Achievement: 08/04/22 Potential to Achieve Goals: Good  OT Frequency: Min 2X/week    Co-evaluation              AM-PAC OT "6 Clicks" Daily Activity     Outcome Measure Help from another person eating meals?: None  Help from another person taking care of personal grooming?: A Little Help from another person toileting, which includes using toliet,  bedpan, or urinal?: A Little Help from another person bathing (including washing, rinsing, drying)?: A Little Help from another person to put on and taking off regular upper body clothing?: A Little Help from another person to put on and taking off regular lower body clothing?: A Little 6 Click Score: 19   End of Session Nurse Communication: Mobility status  Activity Tolerance: Patient tolerated treatment well Patient left: in bed;with call bell/phone within reach;with bed alarm set;with family/visitor present  OT Visit Diagnosis: Unsteadiness on feet (R26.81);Muscle weakness (generalized) (M62.81);Other abnormalities of gait and mobility (R26.89);Other symptoms and signs involving cognitive function                Time: 1235-1311 OT Time Calculation (min): 36 min Charges:  OT General Charges $OT Visit: 1 Visit OT Evaluation $OT Eval Moderate Complexity: 1 Mod OT Treatments $Self Care/Home Management : 8-22 mins  Tyler Deis, OTR/L [facility] Acute Rehabilitation Office: [phone]   Myrla Halsted 07/21/2022, 2:05 PM

## 2022-07-21 NOTE — Plan of Care (Signed)
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
## 2022-07-21 NOTE — Progress Notes (Addendum)
PROGRESS NOTE    [name]  ZOX:[id number] DOB: 04/12/1945 DOA: 07/19/2022 PCP: Amalia Greenhouse, DO   Brief Narrative:   Dwayne Vargas is a 77 y.o. male with medical history significant of hypertension, hyperlipidemia, diabetes mellitus type 2, CKD stage IIIa, bipolar 1 disorder, anxiety and depression, hard of hearing, blind in left eye, penile cancer s/p excision, and BPH who presents after having several falls at home.  At baseline patient states that he lives alone and normally walks with need of assistance.   In the the emergency department patient was seen to be afebrile with blood pressures 144/70-163/85, and all other vital signs maintained.  Labs noted hemoglobin 10.8 which is down from prior of 11.9, and all other labs appear relatively near patient's baseline. MRI of the brain showed no acute abnormality with chronic small vessel ischemia and volume loss.  Patient has been given meclizine and Ativan.  2 attempts have been made to ambulate the patient which were unsuccessful.    Assessment & Plan:   Frequent falls: -MRI of the brain was negative for any acute abnormality.  Suspect polypharmacy -Normal TSH.  B12 is lower range of normal.  Will start supplement.  UA is pending. -PT recommended SNF.  TOC consulted -Orthostatic vitals positive.  Will hold all antihypertensive and start gentle hydration. -Patient wishes to go home  Hypochromic anemia -Iron panel is consistent of low serum iron and saturation  -Will start on iron supplements.   Essential hypertension -Blood pressure stable.  Will hold on amlodipine and ARB at this time since orthostatic vitals positive.  Start gentle hydration.  Will continue to monitor BP  Bipolar disorder -Patient has a history of bipolar disorder.  Oxcarbazepine had recently been started in addition to patient already being on Lamictal 200 mg daily. -Discontinue oxcarbazepine  -Continue Lamictal -Recommend to follow-up with psychiatry outpatient    CKD stage IIIa Stable.  -Continue to monitor   Hyperlipidemia -Continue Crestor   Atopic dermatitis -Hydrocortisone cream as needed to affected areas   Insomnia -Patient chronically deals with difficulty sleeping.  His psychiatrist had recently started Restoril, but daughter states this should be discontinued as she feels like it likely is contributing to his falls. -Continue trazodone -Restoril discontinued   BPH -Patient with prior history of TURP done in 2017. -Continue Flomax  DVT prophylaxis: Lovenox Code Status: Full code Family Communication:  None present at bedside.  Plan of care discussed with patient in length and he verbalized understanding and agreed with it.  Daughter present at the bedside.  Plan of care discussed with patient's daughter and she agreed  Disposition Plan: SNF vs home  Consultants:  None  Procedures:  None  Antimicrobials:  None  Status is: Observation    Subjective: Patient seen and examined.  Sleepy but arousable.  Denies any new complaints.  Wishes to go home however he is aware that he needs SNF due to his recurrent fall.  No acute events overnight.  Objective: Vitals:   07/20/22 2034 07/21/22 0458 07/21/22 0923 07/21/22 0941  BP: (!) 154/75 136/66 137/75   Pulse: 79 77 86   Resp: 18 18 19    Temp: 99.1 F (37.3 C) 98.1 F (36.7 C)  98.2 F (36.8 C)  TempSrc: Oral Oral  Oral  SpO2: 97% 95% 96%   Weight:      Height:        Intake/Output Summary (Last 24 hours) at 07/21/2022 1104 Last data filed at 07/21/2022 0943 Gross per 24  hour  Intake --  Output 1000 ml  Net -1000 ml   Filed Weights   07/19/22 2254 07/20/22 0849  Weight: 82.6 kg 86.2 kg    Examination:  General exam: Appears calm and comfortable, on room air, communicating well Respiratory system: Clear to auscultation. Respiratory effort normal. Cardiovascular system: S1 & S2 heard, RRR. No JVD, murmurs, rubs, gallops or clicks. No pedal  edema. Gastrointestinal system: Abdomen is nondistended, soft and nontender. No organomegaly or masses felt. Normal bowel sounds heard. Central nervous system: Alert and oriented. No focal neurological deficits. Extremities: Symmetric 5 x 5 power. Skin: Multiple bruises noted on both arms Psychiatry: Judgement and insight appear normal. Mood & affect appropriate.    Data Reviewed: I have personally reviewed following labs and imaging studies  CBC: Recent Labs  Lab 07/20/22 0232 07/20/22 0856 07/21/22 0445  WBC 7.4 8.4 7.9  NEUTROABS 5.1  --   --   HGB 10.8* 11.5* 10.6*  HCT 34.8* 35.6* 33.6*  MCV 82.7 81.3 79.8*  PLT 258 259 276   Basic Metabolic Panel: Recent Labs  Lab 07/20/22 0232 07/21/22 0445  NA 137 136  K 4.1 4.1  CL 104 104  CO2 22 22  GLUCOSE 106* 121*  BUN 18 17  CREATININE 1.24 1.09  CALCIUM 9.4 9.7  MG 2.2  --    GFR: Estimated Creatinine Clearance: 60.4 mL/min (by C-G formula based on SCr of 1.09 mg/dL). Liver Function Tests: Recent Labs  Lab 07/20/22 0232  AST 18  ALT 23  ALKPHOS 74  BILITOT 0.4  PROT 6.1*  ALBUMIN 3.1*   No results for input(s): "LIPASE", "AMYLASE" in the last 168 hours. No results for input(s): "AMMONIA" in the last 168 hours. Coagulation Profile: No results for input(s): "INR", "PROTIME" in the last 168 hours. Cardiac Enzymes: No results for input(s): "CKTOTAL", "CKMB", "CKMBINDEX", "TROPONINI" in the last 168 hours. BNP (last 3 results) No results for input(s): "PROBNP" in the last 8760 hours. HbA1C: No results for input(s): "HGBA1C" in the last 72 hours. CBG: No results for input(s): "GLUCAP" in the last 168 hours. Lipid Profile: No results for input(s): "CHOL", "HDL", "LDLCALC", "TRIG", "CHOLHDL", "LDLDIRECT" in the last 72 hours. Thyroid Function Tests: Recent Labs    07/20/22 0232  TSH 0.448   Anemia Panel: Recent Labs    07/20/22 0856  VITAMINB12 275  FERRITIN 48  TIBC 365  IRON 26*   Sepsis  Labs: No results for input(s): "PROCALCITON", "LATICACIDVEN" in the last 168 hours.  No results found for this or any previous visit (from the past 240 hour(s)).    Radiology Studies: MR BRAIN WO CONTRAST  Result Date: 07/20/2022 CLINICAL DATA:  Acute neurologic deficit EXAM: MRI HEAD WITHOUT CONTRAST TECHNIQUE: Multiplanar, multiecho pulse sequences of the brain and surrounding structures were obtained without intravenous contrast. COMPARISON:  03/11/2022 FINDINGS: Brain: No acute infarct, mass effect or extra-axial collection. No acute or chronic hemorrhage. There is multifocal hyperintense T2-weighted signal within the white matter. Generalized volume loss. The midline structures are normal. Vascular: Major flow voids are preserved. Skull and upper cervical spine: Normal calvarium and skull base. Visualized upper cervical spine and soft tissues are normal. Sinuses/Orbits:No paranasal sinus fluid levels or advanced mucosal thickening. No mastoid or middle ear effusion. Normal orbits. IMPRESSION: 1. No acute intracranial abnormality. 2. Findings of chronic small vessel ischemia and volume loss. Electronically Signed   By: Deatra Robinson M.D.   On: 07/20/2022 01:49    Scheduled Meds:  acidophilus  1 capsule Oral Daily   amLODipine  2.5 mg Oral Daily   empagliflozin  25 mg Oral Daily   enoxaparin (LOVENOX) injection  40 mg Subcutaneous Q24H   hydrocortisone cream   Topical BID   irbesartan  300 mg Oral Daily   lamoTRIgine  100 mg Oral QHS   rosuvastatin  10 mg Oral Daily   sodium chloride flush  3 mL Intravenous Q12H   traZODone  100 mg Oral QHS   Continuous Infusions:   LOS: 0 days   Time spent: 35 minutes   Masayo Fera Estill Cotta, MD [facility]  If 7PM-7AM, please contact night-coverage [email or web address] 07/21/2022, 11:04 AM

## 2022-07-22 DIAGNOSIS — R296 Repeated falls: Secondary | ICD-10-CM | POA: Diagnosis not present

## 2022-07-22 LAB — BASIC METABOLIC PANEL
Anion gap: 8 (ref 5–15)
BUN: 22 mg/dL (ref 8–23)
CO2: 21 mmol/L — ABNORMAL LOW (ref 22–32)
Calcium: 9.6 mg/dL (ref 8.9–10.3)
Chloride: 108 mmol/L (ref 98–111)
Creatinine, Ser: 1.14 mg/dL (ref 0.61–1.24)
GFR, Estimated: >60 mL/min (ref >60)
Glucose, Bld: 134 mg/dL — ABNORMAL HIGH (ref 70–99)
Potassium: 4.1 mmol/L (ref 3.5–5.1)
Sodium: 137 mmol/L (ref 135–145)

## 2022-07-22 LAB — CBC
HCT: 34.6 % — ABNORMAL LOW (ref 39.0–52.0)
Hemoglobin: 11.1 g/dL — ABNORMAL LOW (ref 13.0–17.0)
MCH: 26 pg (ref 26.0–34.0)
MCHC: 32.1 g/dL (ref 30.0–36.0)
MCV: 81 fL (ref 80.0–100.0)
Platelets: 285 10*3/uL (ref 150–400)
RBC: 4.27 MIL/uL (ref 4.22–5.81)
RDW: 16 % — ABNORMAL HIGH (ref 11.5–15.5)
WBC: 9.1 10*3/uL (ref 4.0–10.5)
nRBC: 0 % (ref 0.0–0.2)

## 2022-07-22 MED ORDER — FERROUS SULFATE 325 (65 FE) MG PO TABS: 325.0000 mg | ORAL_TABLET | Freq: Every day | ORAL | 3 refills | Status: DC

## 2022-07-22 MED ORDER — CYANOCOBALAMIN 1000 MCG PO TABS: 1000.0000 ug | ORAL_TABLET | Freq: Every day | ORAL | 0 refills | Status: DC

## 2022-07-22 NOTE — Discharge Summary (Signed)
Physician Discharge Summary  Dwayne Vargas ZOX:[id number] DOB: August 12, 1945 DOA: 07/19/2022  PCP: Amalia Greenhouse, DO  Admit date: 07/19/2022 Discharge date: 07/22/2022  Admitted From: Home Disposition:  Home  Recommendations for Outpatient Follow-up:  Follow up with PCP in 1-2 weeks Hold on amlodipine and valsartan Monitor BP at home Drink plenty of water. Use walker for ambulation Discussed with PCP regarding antihypertensive Continue to follow-up with psychiatry as a scheduled at [facility]: None Equipment/Devices: None Discharge Condition: Will CODE STATUS: Full code Diet recommendation: Heart healthy diet  Brief/Interim Summary: Dwayne Vargas is a 77 y.o. male with medical history significant of hypertension, hyperlipidemia, diabetes mellitus type 2, CKD stage IIIa, bipolar 1 disorder, anxiety and depression, hard of hearing, blind in left eye, penile cancer s/p excision, and BPH who presents after having several falls at home.  At baseline patient states that he lives alone and normally walks with need of assistance.    In the the emergency department patient was seen to be afebrile with blood pressures 144/70-163/85, and all other vital signs maintained.  Labs noted hemoglobin 10.8 which is down from prior of 11.9, and all other labs appear relatively near patient's baseline. MRI of the brain showed no acute abnormality with chronic small vessel ischemia and volume loss.  Patient has been given meclizine and Ativan.  2 attempts have been made to ambulate the patient which were unsuccessful.  Frequent falls: -Likely in the setting of polypharmacy and orthostatic hypotension -MRI of the brain was negative for any acute abnormality.  -Orthostatic vitals were positive on 5/25.  Antihypertensive medication were held.  Started on gentle hydration.  BP stable today. -His symptoms improved.  PT recommended SNF however patient wishes to go home. -Recommend to continue to hold on amlodipine and  valsartan at home.  Monitor BP at home and follow-up with PCP.  Hypochromic anemia -Iron panel is consistent of low serum iron and saturation  -Started on iron supplement.  Recommend to take over-the-counter ferrous sulfate 325 mg daily   Essential hypertension -Blood pressure stable without antihypertensive medication.  Will continue to hold amlodipine and valsartan at the time of discharge.  Recommend to monitor BP at home.    Bipolar disorder -Patient has a history of bipolar disorder.  Oxcarbazepine had recently been started in addition to patient already being on Lamictal 200 mg daily. -Discontinued oxcarbazepine  -Continued Lamictal -Recommend to follow-up with psychiatry outpatient   CKD stage IIIa -Remained stable  Hyperlipidemia -Continued Crestor   Atopic dermatitis -Hydrocortisone cream as needed to affected areas   Insomnia -Patient chronically deals with difficulty sleeping.  His psychiatrist had recently started Restoril, but daughter states this should be discontinued as she feels like it likely is contributing to his falls. -Continued trazodone -Restoril discontinued   BPH -Patient with prior history of TURP done in 2017. -Continued Flomax  Discharge Diagnoses:  Frequent falls Hypochromic anemia Essential hypertension Bipolar disorder CKD stage IIIa Hyperlipidemia Atopic dermatitis Insomnia BPH   Discharge Instructions  Discharge Instructions     Diet - low sodium heart healthy   Complete by: As directed    Increase activity slowly   Complete by: As directed       Allergies as of 07/22/2022       Reactions   Aspirin Other (See Comments)   GI upset   Contrast Media [iodinated Contrast Media] Hives, Swelling   Fentanyl    Pt wishes not to have   Morphine And Codeine Other (See  Comments)   Tremors    Oxycontin [oxycodone Hcl] Other (See Comments)   "pt does not wish to have"   Shellfish Allergy Hives   All fish and shellfish w/ iodine    Diazepam Other (See Comments)   Patient does not want to use        Medication List     STOP taking these medications    amLODipine 2.5 MG tablet Commonly known as: NORVASC   valsartan 320 MG tablet Commonly known as: DIOVAN       TAKE these medications    cyanocobalamin 1000 MCG tablet Take 1 tablet (1,000 mcg total) by mouth daily. Start taking on: Jul 23, 2022   ferrous sulfate 325 (65 FE) MG tablet Take 1 tablet (325 mg total) by mouth daily with breakfast. Start taking on: Jul 23, 2022   Jardiance 25 MG Tabs tablet Generic drug: empagliflozin Take 25 mg by mouth daily.   lamoTRIgine 200 MG tablet Commonly known as: LAMICTAL Take 100 mg by mouth at bedtime.   metFORMIN 500 MG tablet Commonly known as: GLUCOPHAGE Take 500 mg by mouth 2 (two) times daily.   Probiotic 1-250 BILLION-MG Caps Take by mouth.   rosuvastatin 10 MG tablet Commonly known as: CRESTOR Take 1 tablet (10 mg total) by mouth daily.   traZODone 50 MG tablet Commonly known as: DESYREL Take 100 mg by mouth at bedtime.        Allergies  Allergen Reactions   Aspirin Other (See Comments)    GI upset   Contrast Media [Iodinated Contrast Media] Hives and Swelling   Fentanyl     Pt wishes not to have   Morphine And Codeine Other (See Comments)    Tremors    Oxycontin [Oxycodone Hcl] Other (See Comments)    "pt does not wish to have"   Shellfish Allergy Hives    All fish and shellfish w/ iodine   Diazepam Other (See Comments)    Patient does not want to use    Consultations: None   Procedures/Studies: MR BRAIN WO CONTRAST  Result Date: 07/20/2022 CLINICAL DATA:  Acute neurologic deficit EXAM: MRI HEAD WITHOUT CONTRAST TECHNIQUE: Multiplanar, multiecho pulse sequences of the brain and surrounding structures were obtained without intravenous contrast. COMPARISON:  03/11/2022 FINDINGS: Brain: No acute infarct, mass effect or extra-axial collection. No acute or chronic  hemorrhage. There is multifocal hyperintense T2-weighted signal within the white matter. Generalized volume loss. The midline structures are normal. Vascular: Major flow voids are preserved. Skull and upper cervical spine: Normal calvarium and skull base. Visualized upper cervical spine and soft tissues are normal. Sinuses/Orbits:No paranasal sinus fluid levels or advanced mucosal thickening. No mastoid or middle ear effusion. Normal orbits. IMPRESSION: 1. No acute intracranial abnormality. 2. Findings of chronic small vessel ischemia and volume loss. Electronically Signed   By: Deatra Robinson M.D.   On: 07/20/2022 01:49      Subjective: Patient seen and examined.  Tells me that he is back to his baseline and no new complaints.  Wishes to go home.  Discussed plan of care with the patient's daughter (who is the pharmacist) at the bedside.  She is agreeable with the plan to discharge home.  She will take care of his medications.  Discussed close follow-up with PCP as well as psychiatry outpatient.  Discussed to continue monitor BP at home.  Recommend use a walker at home in order to avoid recurrent falls.  Discharge Exam: Vitals:   07/22/22 0531 07/22/22  0845  BP: 122/69 123/69  Pulse: 70 79  Resp: 18 18  Temp: 98 F (36.7 C) 98.1 F (36.7 C)  SpO2: 98% 99%   Vitals:   07/21/22 1644 07/21/22 2001 07/22/22 0531 07/22/22 0845  BP: 118/63 (!) 143/77 122/69 123/69  Pulse: 76 79 70 79  Resp: 17 17 18 18   Temp: 98.1 F (36.7 C) 98.1 F (36.7 C) 98 F (36.7 C) 98.1 F (36.7 C)  TempSrc: Oral Oral Oral Oral  SpO2: 97% 97% 98% 99%  Weight:      Height:        General: Pt is alert, awake, not in acute distress, on room air, communicating well, daughter at the bedside Cardiovascular: RRR, S1/S2 +, no rubs, no gallops Respiratory: CTA bilaterally, no wheezing, no rhonchi Abdominal: Soft, NT, ND, bowel sounds + Extremities: no edema, no cyanosis    The results of significant diagnostics  from this hospitalization (including imaging, microbiology, ancillary and laboratory) are listed below for reference.     Microbiology: No results found for this or any previous visit (from the past 240 hour(s)).   Labs: BNP (last 3 results) No results for input(s): "BNP" in the last 8760 hours. Basic Metabolic Panel: Recent Labs  Lab 07/20/22 0232 07/21/22 0445 07/22/22 0650  NA 137 136 137  K 4.1 4.1 4.1  CL 104 104 108  CO2 22 22 21*  GLUCOSE 106* 121* 134*  BUN 18 17 22   CREATININE 1.24 1.09 1.14  CALCIUM 9.4 9.7 9.6  MG 2.2  --   --    Liver Function Tests: Recent Labs  Lab 07/20/22 0232  AST 18  ALT 23  ALKPHOS 74  BILITOT 0.4  PROT 6.1*  ALBUMIN 3.1*   No results for input(s): "LIPASE", "AMYLASE" in the last 168 hours. No results for input(s): "AMMONIA" in the last 168 hours. CBC: Recent Labs  Lab 07/20/22 0232 07/20/22 0856 07/21/22 0445 07/22/22 0650  WBC 7.4 8.4 7.9 9.1  NEUTROABS 5.1  --   --   --   HGB 10.8* 11.5* 10.6* 11.1*  HCT 34.8* 35.6* 33.6* 34.6*  MCV 82.7 81.3 79.8* 81.0  PLT 258 259 276 285   Cardiac Enzymes: No results for input(s): "CKTOTAL", "CKMB", "CKMBINDEX", "TROPONINI" in the last 168 hours. BNP: Invalid input(s): "POCBNP" CBG: No results for input(s): "GLUCAP" in the last 168 hours. D-Dimer No results for input(s): "DDIMER" in the last 72 hours. Hgb A1c No results for input(s): "HGBA1C" in the last 72 hours. Lipid Profile No results for input(s): "CHOL", "HDL", "LDLCALC", "TRIG", "CHOLHDL", "LDLDIRECT" in the last 72 hours. Thyroid function studies Recent Labs    07/20/22 0232  TSH 0.448   Anemia work up Recent Labs    07/20/22 0856  VITAMINB12 275  FERRITIN 48  TIBC 365  IRON 26*   Urinalysis    Component Value Date/Time   COLORURINE YELLOW 03/11/2022 0106   APPEARANCEUR CLEAR 03/11/2022 0106   LABSPEC 1.013 03/11/2022 0106   PHURINE 5.0 03/11/2022 0106   GLUCOSEU >=500 (A) 03/11/2022 0106   HGBUR  NEGATIVE 03/11/2022 0106   BILIRUBINUR NEGATIVE 03/11/2022 0106   KETONESUR NEGATIVE 03/11/2022 0106   PROTEINUR NEGATIVE 03/11/2022 0106   UROBILINOGEN 1.0 08/11/2014 1216   NITRITE NEGATIVE 03/11/2022 0106   LEUKOCYTESUR NEGATIVE 03/11/2022 0106   Sepsis Labs Recent Labs  Lab 07/20/22 0232 07/20/22 0856 07/21/22 0445 07/22/22 0650  WBC 7.4 8.4 7.9 9.1   Microbiology No results found for this or  any previous visit (from the past 240 hour(s)).   Time coordinating discharge: Over 30 minutes  SIGNED:   Ollen Bowl, MD  [facility] 07/22/2022, 10:13 AM Pager   If 7PM-7AM, please contact night-coverage [email or web address]

## 2022-07-22 NOTE — Plan of Care (Signed)
  Problem: Activity: Goal: Risk for activity intolerance will decrease 07/22/2022 1034 by Doyce Para, RN Outcome: Adequate for Discharge 07/22/2022 1033 by Doyce Para, RN Outcome: Progressing

## 2022-07-22 NOTE — Plan of Care (Signed)

## 2022-07-22 NOTE — TOC Transition Note (Signed)
Transition of Care [facility]) - CM/SW Discharge Note   Patient Details  Name: Dwayne Vargas MRN: [id number] Date of Birth: 1945-05-24  Transition of Care [facility]) CM/SW Contact:  Lawerance Sabal, RN Phone Number: 07/22/2022, 10:28 AM   Clinical Narrative:      Sherron Monday w patient's daughter over the phone. Discussed recs and needs for DC. At this time she feels like syptoms are resolving and were most likely from medication adjustments prior to admission. She declines HH or OP follow up, states dizzyness is resolved. Understands to call PCP once home if needs arise for [facility] or OP.  Patient has RW at home, no other DME needs.   Dwayne Vargas (Daughter) [phone]   Final next level of care: Home/Self Care Barriers to Discharge: No Barriers Identified   Patient Goals and CMS Choice CMS Medicare.gov Compare Post Acute Care list provided to:: Other (Comment Required) Choice offered to / list presented to : Adult Children  Discharge Placement                         Discharge Plan and Services Additional resources added to the After Visit Summary for     Discharge Planning Services: CM Consult Post Acute Care Choice: Skilled Nursing Facility          DME Arranged: N/A         HH Arranged: Refused HH          Social Determinants of Health (SDOH) Interventions SDOH Screenings   Food Insecurity: No Food Insecurity (07/20/2022)  Housing: Low Risk  (07/20/2022)  Transportation Needs: No Transportation Needs (07/20/2022)  Utilities: Not At Risk (07/20/2022)  Tobacco Use: Medium Risk (07/19/2022)     Readmission Risk Interventions     No data to display

## 2022-07-22 NOTE — Plan of Care (Signed)
  Problem: Education: Goal: Knowledge of General Education information will improve Description: Including pain rating scale, medication(s)/side effects and non-pharmacologic comfort measures 07/22/2022 1055 by Doyce Para, RN Outcome: Adequate for Discharge 07/22/2022 1034 by Doyce Para, RN Outcome: Adequate for Discharge 07/22/2022 1033 by Doyce Para, RN Outcome: Progressing   Problem: Health Behavior/Discharge Planning: Goal: Ability to manage health-related needs will improve 07/22/2022 1055 by Doyce Para, RN Outcome: Adequate for Discharge 07/22/2022 1034 by Doyce Para, RN Outcome: Adequate for Discharge 07/22/2022 1033 by Doyce Para, RN Outcome: Progressing   Problem: Clinical Measurements: Goal: Ability to maintain clinical measurements within normal limits will improve 07/22/2022 1055 by Doyce Para, RN Outcome: Adequate for Discharge 07/22/2022 1034 by Doyce Para, RN Outcome: Adequate for Discharge 07/22/2022 1033 by Doyce Para, RN Outcome: Progressing Goal: Will remain free from infection 07/22/2022 1055 by Doyce Para, RN Outcome: Adequate for Discharge 07/22/2022 1034 by Doyce Para, RN Outcome: Adequate for Discharge 07/22/2022 1033 by Doyce Para, RN Outcome: Progressing Goal: Diagnostic test results will improve 07/22/2022 1055 by Doyce Para, RN Outcome: Adequate for Discharge 07/22/2022 1034 by Doyce Para, RN Outcome: Adequate for Discharge 07/22/2022 1033 by Doyce Para, RN Outcome: Progressing Goal: Respiratory complications will improve 07/22/2022 1055 by Doyce Para, RN Outcome: Adequate for Discharge 07/22/2022 1034 by Doyce Para, RN Outcome: Adequate for Discharge 07/22/2022 1033 by Doyce Para, RN Outcome: Progressing Goal: Cardiovascular complication will be avoided 07/22/2022 1055 by Doyce Para,  RN Outcome: Adequate for Discharge 07/22/2022 1034 by Doyce Para, RN Outcome: Adequate for Discharge 07/22/2022 1033 by Doyce Para, RN Outcome: Progressing   Problem: Activity: Goal: Risk for activity intolerance will decrease 07/22/2022 1055 by Doyce Para, RN Outcome: Adequate for Discharge 07/22/2022 1034 by Doyce Para, RN Outcome: Adequate for Discharge 07/22/2022 1033 by Doyce Para, RN Outcome: Progressing   Problem: Nutrition: Goal: Adequate nutrition will be maintained 07/22/2022 1055 by Doyce Para, RN Outcome: Adequate for Discharge 07/22/2022 1034 by Doyce Para, RN Outcome: Adequate for Discharge 07/22/2022 1033 by Doyce Para, RN Outcome: Progressing   Problem: Coping: Goal: Level of anxiety will decrease 07/22/2022 1055 by Doyce Para, RN Outcome: Adequate for Discharge 07/22/2022 1034 by Doyce Para, RN Outcome: Adequate for Discharge 07/22/2022 1033 by Doyce Para, RN Outcome: Progressing   Problem: Elimination: Goal: Will not experience complications related to bowel motility 07/22/2022 1055 by Doyce Para, RN Outcome: Adequate for Discharge 07/22/2022 1034 by Doyce Para, RN Outcome: Adequate for Discharge 07/22/2022 1033 by Doyce Para, RN Outcome: Progressing Goal: Will not experience complications related to urinary retention 07/22/2022 1055 by Doyce Para, RN Outcome: Adequate for Discharge 07/22/2022 1034 by Doyce Para, RN Outcome: Adequate for Discharge 07/22/2022 1033 by Doyce Para, RN Outcome: Progressing   Problem: Pain Managment: Goal: General experience of comfort will improve 07/22/2022 1055 by Doyce Para, RN Outcome: Adequate for Discharge 07/22/2022 1034 by Doyce Para, RN Outcome: Adequate for Discharge 07/22/2022 1033 by Doyce Para, RN Outcome: Progressing   Problem:  Safety: Goal: Ability to remain free from injury will improve 07/22/2022 1055 by Doyce Para, RN Outcome: Adequate for Discharge 07/22/2022 1034 by Doyce Para, RN Outcome: Adequate for Discharge 07/22/2022 1033 by Doyce Para, RN Outcome: Progressing   Problem: Skin Integrity: Goal: Risk for impaired skin integrity will decrease 07/22/2022 1055 by Doyce Para, RN Outcome: Adequate for Discharge 07/22/2022 1034 by Doyce Para, RN Outcome: Adequate for Discharge 07/22/2022 1033 by Doyce Para, RN Outcome: Progressing

## 2022-08-08 DIAGNOSIS — I951 Orthostatic hypotension: Secondary | ICD-10-CM | POA: Diagnosis not present

## 2022-08-08 DIAGNOSIS — N1831 Chronic kidney disease, stage 3a: Secondary | ICD-10-CM | POA: Diagnosis not present

## 2022-08-08 DIAGNOSIS — E46 Unspecified protein-calorie malnutrition: Secondary | ICD-10-CM | POA: Diagnosis not present

## 2022-08-08 DIAGNOSIS — I700 Atherosclerosis of aorta: Secondary | ICD-10-CM | POA: Diagnosis not present

## 2022-08-08 DIAGNOSIS — I2510 Atherosclerotic heart disease of native coronary artery without angina pectoris: Secondary | ICD-10-CM | POA: Diagnosis not present

## 2022-08-08 DIAGNOSIS — D649 Anemia, unspecified: Secondary | ICD-10-CM | POA: Diagnosis not present

## 2022-08-08 DIAGNOSIS — E1122 Type 2 diabetes mellitus with diabetic chronic kidney disease: Secondary | ICD-10-CM | POA: Diagnosis not present

## 2022-08-10 DIAGNOSIS — R351 Nocturia: Secondary | ICD-10-CM | POA: Diagnosis not present

## 2022-08-24 DIAGNOSIS — I959 Hypotension, unspecified: Secondary | ICD-10-CM | POA: Diagnosis not present

## 2022-08-24 DIAGNOSIS — Z8679 Personal history of other diseases of the circulatory system: Secondary | ICD-10-CM | POA: Diagnosis not present

## 2022-08-24 DIAGNOSIS — E782 Mixed hyperlipidemia: Secondary | ICD-10-CM | POA: Diagnosis not present

## 2022-08-24 DIAGNOSIS — E119 Type 2 diabetes mellitus without complications: Secondary | ICD-10-CM | POA: Diagnosis not present

## 2022-08-24 DIAGNOSIS — F319 Bipolar disorder, unspecified: Secondary | ICD-10-CM | POA: Diagnosis not present

## 2022-08-24 DIAGNOSIS — Z79899 Other long term (current) drug therapy: Secondary | ICD-10-CM | POA: Diagnosis not present

## 2022-08-27 DIAGNOSIS — N3000 Acute cystitis without hematuria: Secondary | ICD-10-CM | POA: Diagnosis not present

## 2022-09-06 DIAGNOSIS — Z79899 Other long term (current) drug therapy: Secondary | ICD-10-CM | POA: Diagnosis not present

## 2022-09-06 DIAGNOSIS — I10 Essential (primary) hypertension: Secondary | ICD-10-CM | POA: Diagnosis not present

## 2022-09-06 DIAGNOSIS — E119 Type 2 diabetes mellitus without complications: Secondary | ICD-10-CM | POA: Diagnosis not present

## 2022-09-20 DIAGNOSIS — Z23 Encounter for immunization: Secondary | ICD-10-CM | POA: Diagnosis not present

## 2022-09-20 DIAGNOSIS — Z87891 Personal history of nicotine dependence: Secondary | ICD-10-CM | POA: Diagnosis not present

## 2022-09-20 DIAGNOSIS — Z8679 Personal history of other diseases of the circulatory system: Secondary | ICD-10-CM | POA: Diagnosis not present

## 2022-10-10 DIAGNOSIS — L02821 Furuncle of head [any part, except face]: Secondary | ICD-10-CM | POA: Diagnosis not present

## 2022-10-16 DIAGNOSIS — S39012A Strain of muscle, fascia and tendon of lower back, initial encounter: Secondary | ICD-10-CM | POA: Diagnosis not present

## 2022-10-16 DIAGNOSIS — M545 Low back pain, unspecified: Secondary | ICD-10-CM | POA: Diagnosis not present

## 2022-10-16 DIAGNOSIS — M25561 Pain in right knee: Secondary | ICD-10-CM | POA: Diagnosis not present

## 2022-10-28 ENCOUNTER — Emergency Department (HOSPITAL_BASED_OUTPATIENT_CLINIC_OR_DEPARTMENT_OTHER): Payer: Medicare PPO | Admitting: Certified Registered Nurse Anesthetist

## 2022-10-28 ENCOUNTER — Emergency Department (HOSPITAL_COMMUNITY): Payer: Medicare PPO | Admitting: Certified Registered Nurse Anesthetist

## 2022-10-28 ENCOUNTER — Other Ambulatory Visit: Payer: Self-pay

## 2022-10-28 ENCOUNTER — Encounter (HOSPITAL_COMMUNITY)
Admission: EM | Disposition: A | Discharge status: Home / Self Care | Payer: Self-pay | Source: Home / Self Care | Attending: Emergency Medicine

## 2022-10-28 ENCOUNTER — Observation Stay (HOSPITAL_COMMUNITY)
Admission: EM | Admit: 2022-10-28 | Payer: Medicare PPO | Source: Home / Self Care | Attending: Emergency Medicine | Admitting: Emergency Medicine

## 2022-10-28 ENCOUNTER — Emergency Department (HOSPITAL_COMMUNITY): Payer: Medicare PPO

## 2022-10-28 ENCOUNTER — Encounter (HOSPITAL_COMMUNITY): Payer: Self-pay

## 2022-10-28 DIAGNOSIS — D62 Acute posthemorrhagic anemia: Secondary | ICD-10-CM | POA: Diagnosis not present

## 2022-10-28 DIAGNOSIS — K2970 Gastritis, unspecified, without bleeding: Secondary | ICD-10-CM | POA: Diagnosis not present

## 2022-10-28 DIAGNOSIS — K279 Peptic ulcer, site unspecified, unspecified as acute or chronic, without hemorrhage or perforation: Secondary | ICD-10-CM | POA: Diagnosis not present

## 2022-10-28 DIAGNOSIS — K921 Melena: Secondary | ICD-10-CM

## 2022-10-28 DIAGNOSIS — R112 Nausea with vomiting, unspecified: Secondary | ICD-10-CM

## 2022-10-28 DIAGNOSIS — K259 Gastric ulcer, unspecified as acute or chronic, without hemorrhage or perforation: Secondary | ICD-10-CM | POA: Diagnosis not present

## 2022-10-28 DIAGNOSIS — K2991 Gastroduodenitis, unspecified, with bleeding: Secondary | ICD-10-CM | POA: Insufficient documentation

## 2022-10-28 DIAGNOSIS — K2950 Unspecified chronic gastritis without bleeding: Secondary | ICD-10-CM | POA: Diagnosis not present

## 2022-10-28 DIAGNOSIS — E1122 Type 2 diabetes mellitus with diabetic chronic kidney disease: Secondary | ICD-10-CM | POA: Insufficient documentation

## 2022-10-28 DIAGNOSIS — K250 Acute gastric ulcer with hemorrhage: Principal | ICD-10-CM | POA: Insufficient documentation

## 2022-10-28 DIAGNOSIS — R195 Other fecal abnormalities: Secondary | ICD-10-CM | POA: Insufficient documentation

## 2022-10-28 DIAGNOSIS — I129 Hypertensive chronic kidney disease with stage 1 through stage 4 chronic kidney disease, or unspecified chronic kidney disease: Secondary | ICD-10-CM | POA: Diagnosis not present

## 2022-10-28 DIAGNOSIS — K222 Esophageal obstruction: Secondary | ICD-10-CM

## 2022-10-28 DIAGNOSIS — K449 Diaphragmatic hernia without obstruction or gangrene: Secondary | ICD-10-CM

## 2022-10-28 DIAGNOSIS — R531 Weakness: Secondary | ICD-10-CM | POA: Diagnosis not present

## 2022-10-28 DIAGNOSIS — Z87891 Personal history of nicotine dependence: Secondary | ICD-10-CM | POA: Diagnosis not present

## 2022-10-28 DIAGNOSIS — Z96653 Presence of artificial knee joint, bilateral: Secondary | ICD-10-CM | POA: Insufficient documentation

## 2022-10-28 DIAGNOSIS — R1111 Vomiting without nausea: Secondary | ICD-10-CM | POA: Diagnosis not present

## 2022-10-28 DIAGNOSIS — Z1152 Encounter for screening for COVID-19: Secondary | ICD-10-CM | POA: Diagnosis not present

## 2022-10-28 DIAGNOSIS — Z7984 Long term (current) use of oral hypoglycemic drugs: Secondary | ICD-10-CM | POA: Insufficient documentation

## 2022-10-28 DIAGNOSIS — Z8549 Personal history of malignant neoplasm of other male genital organs: Secondary | ICD-10-CM | POA: Insufficient documentation

## 2022-10-28 DIAGNOSIS — D509 Iron deficiency anemia, unspecified: Secondary | ICD-10-CM | POA: Diagnosis not present

## 2022-10-28 DIAGNOSIS — Z79899 Other long term (current) drug therapy: Secondary | ICD-10-CM | POA: Insufficient documentation

## 2022-10-28 DIAGNOSIS — D6489 Other specified anemias: Secondary | ICD-10-CM | POA: Diagnosis not present

## 2022-10-28 DIAGNOSIS — R4182 Altered mental status, unspecified: Secondary | ICD-10-CM | POA: Diagnosis not present

## 2022-10-28 DIAGNOSIS — K269 Duodenal ulcer, unspecified as acute or chronic, without hemorrhage or perforation: Secondary | ICD-10-CM | POA: Insufficient documentation

## 2022-10-28 DIAGNOSIS — M6281 Muscle weakness (generalized): Secondary | ICD-10-CM | POA: Diagnosis not present

## 2022-10-28 DIAGNOSIS — K229 Disease of esophagus, unspecified: Secondary | ICD-10-CM | POA: Diagnosis not present

## 2022-10-28 DIAGNOSIS — D649 Anemia, unspecified: Secondary | ICD-10-CM

## 2022-10-28 DIAGNOSIS — R519 Headache, unspecified: Secondary | ICD-10-CM | POA: Diagnosis not present

## 2022-10-28 DIAGNOSIS — R197 Diarrhea, unspecified: Secondary | ICD-10-CM | POA: Diagnosis not present

## 2022-10-28 DIAGNOSIS — I10 Essential (primary) hypertension: Secondary | ICD-10-CM | POA: Diagnosis not present

## 2022-10-28 DIAGNOSIS — R2681 Unsteadiness on feet: Secondary | ICD-10-CM | POA: Diagnosis not present

## 2022-10-28 DIAGNOSIS — K922 Gastrointestinal hemorrhage, unspecified: Secondary | ICD-10-CM | POA: Diagnosis present

## 2022-10-28 DIAGNOSIS — R9082 White matter disease, unspecified: Secondary | ICD-10-CM | POA: Diagnosis not present

## 2022-10-28 DIAGNOSIS — N189 Chronic kidney disease, unspecified: Secondary | ICD-10-CM | POA: Diagnosis not present

## 2022-10-28 DIAGNOSIS — R059 Cough, unspecified: Secondary | ICD-10-CM | POA: Diagnosis not present

## 2022-10-28 HISTORY — PX: BIOPSY: SHX5522

## 2022-10-28 HISTORY — PX: ESOPHAGOGASTRODUODENOSCOPY: SHX5428

## 2022-10-28 LAB — URINALYSIS, W/ REFLEX TO CULTURE (INFECTION SUSPECTED)
Bilirubin Urine: NEGATIVE
Glucose, UA: NEGATIVE mg/dL
Ketones, ur: NEGATIVE mg/dL
Nitrite: NEGATIVE
Protein, ur: 30 mg/dL — AB
Specific Gravity, Urine: 1.017 (ref 1.005–1.030)
pH: 5 (ref 5.0–8.0)

## 2022-10-28 LAB — COMPREHENSIVE METABOLIC PANEL
ALT: 20 U/L (ref 0–44)
AST: 21 U/L (ref 15–41)
Albumin: 3.5 g/dL (ref 3.5–5.0)
Alkaline Phosphatase: 41 U/L (ref 38–126)
Anion gap: 12 (ref 5–15)
BUN: 54 mg/dL — ABNORMAL HIGH (ref 8–23)
CO2: 20 mmol/L — ABNORMAL LOW (ref 22–32)
Calcium: 9.4 mg/dL (ref 8.9–10.3)
Chloride: 108 mmol/L (ref 98–111)
Creatinine, Ser: 1.43 mg/dL — ABNORMAL HIGH (ref 0.61–1.24)
GFR, Estimated: 50 mL/min — ABNORMAL LOW (ref >60)
Glucose, Bld: 129 mg/dL — ABNORMAL HIGH (ref 70–99)
Potassium: 3.8 mmol/L (ref 3.5–5.1)
Sodium: 140 mmol/L (ref 135–145)
Total Bilirubin: 0.9 mg/dL (ref 0.3–1.2)
Total Protein: 5.7 g/dL — ABNORMAL LOW (ref 6.5–8.1)

## 2022-10-28 LAB — PREPARE RBC (CROSSMATCH)

## 2022-10-28 LAB — SARS CORONAVIRUS 2 BY RT PCR: SARS Coronavirus 2 by RT PCR: NEGATIVE

## 2022-10-28 LAB — I-STAT CHEM 8, ED
BUN: 54 mg/dL — ABNORMAL HIGH (ref 8–23)
Calcium, Ion: 1.3 mmol/L (ref 1.15–1.40)
Chloride: 110 mmol/L (ref 98–111)
Creatinine, Ser: 1.3 mg/dL — ABNORMAL HIGH (ref 0.61–1.24)
Glucose, Bld: 124 mg/dL — ABNORMAL HIGH (ref 70–99)
HCT: 21 % — ABNORMAL LOW (ref 39.0–52.0)
Hemoglobin: 7.1 g/dL — ABNORMAL LOW (ref 13.0–17.0)
Potassium: 3.9 mmol/L (ref 3.5–5.1)
Sodium: 140 mmol/L (ref 135–145)
TCO2: 19 mmol/L — ABNORMAL LOW (ref 22–32)

## 2022-10-28 LAB — CBC WITH DIFFERENTIAL/PLATELET
Abs Immature Granulocytes: 0.15 10*3/uL — ABNORMAL HIGH (ref 0.00–0.07)
Basophils Absolute: 0.1 10*3/uL (ref 0.0–0.1)
Basophils Relative: 0 %
Eosinophils Absolute: 0.1 10*3/uL (ref 0.0–0.5)
Eosinophils Relative: 1 %
HCT: 21.2 % — ABNORMAL LOW (ref 39.0–52.0)
Hemoglobin: 6.9 g/dL — CL (ref 13.0–17.0)
Immature Granulocytes: 1 %
Lymphocytes Relative: 8 %
Lymphs Abs: 1.2 10*3/uL (ref 0.7–4.0)
MCH: 28.6 pg (ref 26.0–34.0)
MCHC: 32.5 g/dL (ref 30.0–36.0)
MCV: 88 fL (ref 80.0–100.0)
Monocytes Absolute: 0.6 10*3/uL (ref 0.1–1.0)
Monocytes Relative: 5 %
Neutro Abs: 12.1 10*3/uL — ABNORMAL HIGH (ref 1.7–7.7)
Neutrophils Relative %: 85 %
Platelets: 188 10*3/uL (ref 150–400)
RBC: 2.41 MIL/uL — ABNORMAL LOW (ref 4.22–5.81)
RDW: 16.6 % — ABNORMAL HIGH (ref 11.5–15.5)
WBC: 14.2 10*3/uL — ABNORMAL HIGH (ref 4.0–10.5)
nRBC: 0 % (ref 0.0–0.2)

## 2022-10-28 LAB — HEMOGLOBIN AND HEMATOCRIT, BLOOD
HCT: 23.2 % — ABNORMAL LOW (ref 39.0–52.0)
Hemoglobin: 7.8 g/dL — ABNORMAL LOW (ref 13.0–17.0)

## 2022-10-28 LAB — TROPONIN I (HIGH SENSITIVITY)
Troponin I (High Sensitivity): 6 ng/L (ref <18)
Troponin I (High Sensitivity): 6 ng/L (ref <18)

## 2022-10-28 LAB — CK: Total CK: 67 U/L (ref 49–397)

## 2022-10-28 LAB — GLUCOSE, CAPILLARY: Glucose-Capillary: 119 mg/dL — ABNORMAL HIGH (ref 70–99)

## 2022-10-28 LAB — POC OCCULT BLOOD, ED: Fecal Occult Bld: POSITIVE — AB

## 2022-10-28 SURGERY — EGD (ESOPHAGOGASTRODUODENOSCOPY)
Anesthesia: Monitor Anesthesia Care

## 2022-10-28 MED ORDER — PANTOPRAZOLE SODIUM 40 MG IV SOLR: 40.0000 mg | Freq: Once | INTRAVENOUS | Status: DC

## 2022-10-28 MED ORDER — SODIUM CHLORIDE 0.9% IV SOLUTION: Freq: Once | INTRAVENOUS | Status: AC

## 2022-10-28 MED ORDER — LIDOCAINE 2% (20 MG/ML) 5 ML SYRINGE
INTRAMUSCULAR | Status: DC | PRN
  Administered 2022-10-28: 40 mg via INTRAVENOUS

## 2022-10-28 MED ORDER — SUCRALFATE 1 GM/10ML PO SUSP
1.0000 g | Freq: Three times a day (TID) | ORAL | Status: DC
  Administered 2022-10-28 – 2022-10-29 (×4): 1 g via ORAL
  Filled 2022-10-28 (×4): qty 10

## 2022-10-28 MED ORDER — PROPOFOL 500 MG/50ML IV EMUL
INTRAVENOUS | Status: DC | PRN
  Administered 2022-10-28: 180 ug/kg/min via INTRAVENOUS

## 2022-10-28 MED ORDER — ROSUVASTATIN CALCIUM 5 MG PO TABS
10.0000 mg | ORAL_TABLET | Freq: Every day | ORAL | Status: DC
  Administered 2022-10-29: 10 mg via ORAL
  Filled 2022-10-28: qty 2

## 2022-10-28 MED ORDER — PANTOPRAZOLE 80MG IVPB - SIMPLE MED
80.0000 mg | Freq: Once | INTRAVENOUS | Status: AC
  Administered 2022-10-28: 80 mg via INTRAVENOUS
  Filled 2022-10-28: qty 100

## 2022-10-28 MED ORDER — LACTATED RINGERS IV SOLN
INTRAVENOUS | Status: AC | PRN
Start: 2022-10-28 — End: 2022-10-28
  Administered 2022-10-28: 1 via INTRAVENOUS

## 2022-10-28 MED ORDER — FERROUS SULFATE 325 (65 FE) MG PO TABS
325.0000 mg | ORAL_TABLET | Freq: Every day | ORAL | Status: DC
  Administered 2022-10-29: 325 mg via ORAL
  Filled 2022-10-28: qty 1

## 2022-10-28 MED ORDER — PANTOPRAZOLE SODIUM 40 MG IV SOLR
40.0000 mg | Freq: Two times a day (BID) | INTRAVENOUS | Status: DC
  Administered 2022-10-28 – 2022-10-29 (×2): 40 mg via INTRAVENOUS
  Filled 2022-10-28 (×2): qty 10

## 2022-10-28 MED ORDER — PROPOFOL 10 MG/ML IV BOLUS
INTRAVENOUS | Status: DC | PRN
  Administered 2022-10-28 (×2): 20 mg via INTRAVENOUS

## 2022-10-28 NOTE — ED Notes (Signed)
ED TO INPATIENT HANDOFF REPORT  ED Nurse Name and Phone #: Vernona Rieger [phone]  S Name/Age/Gender Dwayne Vargas 77 y.o. male Room/Bed: 040C/040C  Code Status   Code Status: Full Code  Home/SNF/Other Home Patient oriented to: self, place, time, and situation Is this baseline? Yes   Triage Complete: Triage complete  Chief Complaint GI bleed [K92.2]  Triage Note Pt bib gcems from home c/o N/V/D and weakness x2 days. Pt stated he felt confused and was going to pass out this morning. Pt pale with ems.  20 g R hand ems gave NS Bp -120/74 Hr- 60 100% RA 228 CBG   Allergies Allergies  Allergen Reactions   Aspirin Other (See Comments)    GI upset   Contrast Media [Iodinated Contrast Media] Hives and Swelling   Fentanyl     Pt wishes not to have   Morphine And Codeine Other (See Comments)    Tremors    Oxycontin [Oxycodone Hcl] Other (See Comments)    "pt does not wish to have"   Shellfish Allergy Hives    All fish and shellfish w/ iodine   Diazepam Other (See Comments)    Patient does not want to use    Level of Care/Admitting Diagnosis ED Disposition     ED Disposition  Admit   Condition  --   Comment  Hospital Area: [facility] [[id number]]  Level of Care: Telemetry Medical [104]  May place patient in observation at [facility] or [facility] if equivalent level of care is available:: Yes  Covid Evaluation: Asymptomatic - no recent exposure (last 10 days) testing not required  Diagnosis: GI bleed [562130]  Admitting Physician: Earl Lagos [[id number]]  Attending Physician: Earl Lagos [phone]          B Medical/Surgery History Past Medical History:  Diagnosis Date   Anxiety    Benign localized prostatic hyperplasia with lower urinary tract symptoms (LUTS)    Bipolar 1 disorder (HCC)    Complication of anesthesia    limited neck motion due to cervical spine surgery   Depression    ED (erectile dysfunction) of organic origin     History of penile cancer    s/p  excision of cancer 2013   HOH (hard of hearing)    left ear   Hypertension    Irritable bowel    intermittant diarrhea  secondary to antibiotics   Legally blind in left eye, as defined in [place]    Stricture, prostate    urethra   Type 2 diabetes mellitus (HCC)    Weak urinary stream    intermittant   Past Surgical History:  Procedure Laterality Date   CATARACT EXTRACTION W/ INTRAOCULAR LENS  IMPLANT, BILATERAL  2007   CERVICAL FUSION  05/ 2016   [facility]   C2 -- C4   CHOLECYSTECTOMY  2013  approx   EXCISION PENILE CANCER  2013   NASAL SEPTUM SURGERY  x2  last one 1990's   SHOULDER ARTHROSCOPY WITH ROTATOR CUFF REPAIR Left 1990's   TOTAL KNEE ARTHROPLASTY Bilateral left 2007/  right and revision in 2009   TRANSURETHRAL INCISION OF PROSTATE N/A 11/21/2015   Procedure: TRANSURETHRAL INCISION OF THE PROSTATE (TUIP);  Surgeon: Hildred Laser, MD;  Location: [facility];  Service: Urology;  Laterality: N/A;   TRANSURETHRAL RESECTION OF PROSTATE N/A 07/22/2015   Procedure: TRANSURETHRAL RESECTION OF THE PROSTATE ;  Surgeon: Hildred Laser, MD;  Location: [facility];  Service: Urology;  Laterality: N/A;   VIDEO ASSISTED THORACOSCOPY (VATS)/THOROCOTOMY Left 08/12/2014   Procedure: VIDEO ASSISTED THORACOSCOPY (VATS)/THOROCOTOMY, DRAINAGE OF EMPYEMA;  Surgeon: Alleen Borne, MD;  Location: [facility];  Service: Thoracic;  Laterality: Left;     A IV Location/Drains/Wounds Patient Lines/Drains/Airways Status     Active Line/Drains/Airways     Name Placement date Placement time Site Days   Peripheral IV 10/28/22 20 G Right Antecubital 10/28/22  0905  Antecubital  less than 1   Peripheral IV 10/28/22 20 G Posterior;Right Hand 10/28/22  0710  Hand  less than 1            Intake/Output Last 24 hours  Intake/Output Summary (Last 24 hours) at 10/28/2022 1247 Last data filed at 10/28/2022 1131 Gross per 24 hour  Intake 100 ml  Output --  Net 100  ml    Labs/Imaging Results for orders placed or performed during the hospital encounter of 10/28/22 (from the past 48 hour(s))  Comprehensive metabolic panel     Status: Abnormal   Collection Time: 10/28/22  7:26 AM  Result Value Ref Range   Sodium 140 135 - 145 mmol/L   Potassium 3.8 3.5 - 5.1 mmol/L   Chloride 108 98 - 111 mmol/L   CO2 20 (L) 22 - 32 mmol/L   Glucose, Bld 129 (H) 70 - 99 mg/dL    Comment: Glucose reference range applies only to samples taken after fasting for at least 8 hours.   BUN 54 (H) 8 - 23 mg/dL   Creatinine, Ser 5.28 (H) 0.61 - 1.24 mg/dL   Calcium 9.4 8.9 - 41.3 mg/dL   Total Protein 5.7 (L) 6.5 - 8.1 g/dL   Albumin 3.5 3.5 - 5.0 g/dL   AST 21 15 - 41 U/L   ALT 20 0 - 44 U/L   Alkaline Phosphatase 41 38 - 126 U/L   Total Bilirubin 0.9 0.3 - 1.2 mg/dL   GFR, Estimated 50 (L) >60 mL/min    Comment: (NOTE) Calculated using the CKD-EPI Creatinine Equation (2021)    Anion gap 12 5 - 15    Comment: Performed at [facility], [address] [place], [place] [address]  CBC with Differential     Status: Abnormal   Collection Time: 10/28/22  7:26 AM  Result Value Ref Range   WBC 14.2 (H) 4.0 - 10.5 K/uL   RBC 2.41 (L) 4.22 - 5.81 MIL/uL   Hemoglobin 6.9 (LL) 13.0 - 17.0 g/dL    Comment: REPEATED TO VERIFY THIS CRITICAL RESULT HAS VERIFIED AND BEEN CALLED TO [facility] BY DANIELLE LONG ON 09 01 2024 AT 0835, AND HAS BEEN READ BACK.     HCT 21.2 (L) 39.0 - 52.0 %   MCV 88.0 80.0 - 100.0 fL   MCH 28.6 26.0 - 34.0 pg   MCHC 32.5 30.0 - 36.0 g/dL   RDW 02.7 (H) 25.3 - 66.4 %   Platelets 188 150 - 400 K/uL    Comment: REPEATED TO VERIFY   nRBC 0.0 0.0 - 0.2 %   Neutrophils Relative % 85 %   Neutro Abs 12.1 (H) 1.7 - 7.7 K/uL   Lymphocytes Relative 8 %   Lymphs Abs 1.2 0.7 - 4.0 K/uL   Monocytes Relative 5 %   Monocytes Absolute 0.6 0.1 - 1.0 K/uL   Eosinophils Relative 1 %   Eosinophils Absolute 0.1 0.0 - 0.5 K/uL   Basophils Relative 0  %   Basophils Absolute 0.1  0.0 - 0.1 K/uL   Immature Granulocytes 1 %   Abs Immature Granulocytes 0.15 (H) 0.00 - 0.07 K/uL    Comment: Performed at [facility], [address] [place], [place] [address]  CK     Status: None   Collection Time: 10/28/22  7:26 AM  Result Value Ref Range   Total CK 67 49 - 397 U/L    Comment: Performed at [facility], [address] [place], [place] [address]  Troponin I (High Sensitivity)     Status: None   Collection Time: 10/28/22  7:26 AM  Result Value Ref Range   Troponin I (High Sensitivity) 6 <18 ng/L    Comment: (NOTE) Elevated high sensitivity troponin I (hsTnI) values and significant  changes across serial measurements may suggest ACS but many other  chronic and acute conditions are known to elevate hsTnI results.  Refer to the "Links" section for chest pain algorithms and additional  guidance. Performed at [facility], [address], [place], [place] [address]   SARS Coronavirus 2 by RT PCR (hospital order, performed in [facility] hospital lab) *cepheid single result test* Anterior Nasal Swab     Status: None   Collection Time: 10/28/22  7:40 AM   Specimen: Anterior Nasal Swab  Result Value Ref Range   SARS Coronavirus 2 by RT PCR NEGATIVE NEGATIVE    Comment: Performed at [facility], [address], [place], [place] [address]  I-stat chem 8, ED     Status: Abnormal   Collection Time: 10/28/22  8:00 AM  Result Value Ref Range   Sodium 140 135 - 145 mmol/L   Potassium 3.9 3.5 - 5.1 mmol/L   Chloride 110 98 - 111 mmol/L   BUN 54 (H) 8 - 23 mg/dL   Creatinine, Ser 2.95 (H) 0.61 - 1.24 mg/dL   Glucose, Bld 621 (H) 70 - 99 mg/dL    Comment: Glucose reference range applies only to samples taken after fasting for at least 8 hours.   Calcium, Ion 1.30 1.15 - 1.40 mmol/L   TCO2 19 (L) 22 - 32 mmol/L   Hemoglobin 7.1 (L) 13.0 - 17.0 g/dL   HCT 30.8 (L) 65.7 - 84.6 %  POC occult blood, ED RN will collect      Status: Abnormal   Collection Time: 10/28/22  8:54 AM  Result Value Ref Range   Fecal Occult Bld POSITIVE (A) NEGATIVE  Type and screen [facility]     Status: None (Preliminary result)   Collection Time: 10/28/22  9:05 AM  Result Value Ref Range   ABO/RH(D) A NEG    Antibody Screen NEG    Sample Expiration 10/31/2022,2359    Unit Number [id number]    Blood Component Type RED CELLS,LR    Unit division 00    Status of Unit ISSUED    Transfusion Status OK TO TRANSFUSE    Crossmatch Result      Compatible Performed at [facility], [address], [place], [place] [address]   Troponin I (High Sensitivity)     Status: None   Collection Time: 10/28/22 10:12 AM  Result Value Ref Range   Troponin I (High Sensitivity) 6 <18 ng/L    Comment: (NOTE) Elevated high sensitivity troponin I (hsTnI) values and significant  changes across serial measurements may suggest ACS but many other  chronic and acute conditions are known to elevate hsTnI results.  Refer  to the "Links" section for chest pain algorithms and additional  guidance. Performed at [facility], [address], [place], [place] [address]   Prepare RBC (crossmatch)     Status: None   Collection Time: 10/28/22 10:30 AM  Result Value Ref Range   Order Confirmation      ORDER PROCESSED BY BLOOD BANK Performed at [facility], [address], [place], [place] [address]    DG Chest 2 View  Result Date: 10/28/2022 CLINICAL DATA:  Cough. EXAM: CHEST - 2 VIEW COMPARISON:  11/20/2021 FINDINGS: The lungs are clear without focal pneumonia, edema, pneumothorax or pleural effusion. The cardiopericardial silhouette is within normal limits for size. No acute bony abnormality. Telemetry leads overlie the chest. IMPRESSION: No active cardiopulmonary disease. Electronically Signed   By: Kennith Center M.D.   On: 10/28/2022 09:00   CT Head Wo Contrast  Result Date: 10/28/2022 CLINICAL DATA:  77 year old male  with altered mental status.  Cough. EXAM: CT HEAD WITHOUT CONTRAST TECHNIQUE: Contiguous axial images were obtained from the base of the skull through the vertex without intravenous contrast. RADIATION DOSE REDUCTION: This exam was performed according to the departmental dose-optimization program which includes automated exposure control, adjustment of the mA and/or kV according to patient size and/or use of iterative reconstruction technique. COMPARISON:  Brain MRI 07/20/2022.  Head CT 03/11/2022. FINDINGS: Brain: Stable cerebral volume. No midline shift, ventriculomegaly, mass effect, evidence of mass lesion, intracranial hemorrhage or evidence of cortically based acute infarction. Stable patchy bilateral cerebral white matter hypodensity, moderate for age. Vascular: No suspicious intracranial vascular hyperdensity. Calcified atherosclerosis at the skull base. Skull: No acute osseous abnormality identified. Sinuses/Orbits: Visualized paranasal sinuses and mastoids are clear. Other: No acute orbit or scalp soft tissue finding. IMPRESSION: 1. No acute intracranial abnormality. 2. Chronic cerebral white matter disease. Electronically Signed   By: Odessa Fleming M.D.   On: 10/28/2022 08:16    Pending Labs Unresulted Labs (From admission, onward)     Start     Ordered   10/28/22 0726  Urinalysis, w/ Reflex to Culture (Infection Suspected) -Urine, Clean Catch  Once,   URGENT       Question:  Specimen Source  Answer:  Urine, Clean Catch   10/28/22 0726            Vitals/Pain Today's Vitals   10/28/22 1140 10/28/22 1145 10/28/22 1200 10/28/22 1230  BP: (!) 145/62 138/65 (!) 149/62 98/84  Pulse: 78 74 79 77  Resp: 14 17 (!) 22 18  Temp:   98.2 F (36.8 C) 98.4 F (36.9 C)  TempSrc:   Oral Oral  SpO2: 100% 100% 100% 100%  Weight:      Height:        Isolation Precautions Airborne and Contact precautions  Medications Medications  pantoprazole (PROTONIX) 80 mg /NS 100 mL IVPB (0 mg Intravenous  Stopped 10/28/22 1131)  0.9 %  sodium chloride infusion (Manually program via Guardrails IV Fluids) ( Intravenous New Bag/Given 10/28/22 1135)    Mobility walks     Focused Assessments Dark stools. GI consulted    R Recommendations: See Admitting Provider Note  Report given to:   Additional Notes:

## 2022-10-28 NOTE — ED Notes (Signed)
Pt's daughter Howard Pouch   [phone]

## 2022-10-28 NOTE — Transfer of Care (Signed)
Immediate Anesthesia Transfer of Care Note  Patient: Dwayne Vargas  Procedure(s) Performed: ESOPHAGOGASTRODUODENOSCOPY (EGD) BIOPSY  Patient Location: Endoscopy Unit  Anesthesia Type:MAC  Level of Consciousness: sedated  Airway & Oxygen Therapy: Patient Spontanous Breathing and Patient connected to nasal cannula oxygen  Post-op Assessment: Report given to RN and Post -op Vital signs reviewed and stable  Post vital signs: Reviewed and stable  Last Vitals:  Vitals Value Taken Time  BP    Temp    Pulse    Resp    SpO2      Last Pain:  Vitals:   10/28/22 1324  TempSrc: Temporal  PainSc: 0-No pain         Complications: No notable events documented.

## 2022-10-28 NOTE — Hospital Course (Addendum)
GI bleedEGD done. Large gastric ulcer, but clean based and healing (biopsied). 2 small superficial ulcers in the antrum and another 2 in the duodenal bulb. All non-bleeding and biopsied. Will cotninue w/ high dose PPI. Full liquids today, and if no ongoing bleeding, can AAT tomorrow. Will also add Carafate and will need repeat EGD in 8 weeks as outpatient to ensure ulcer healing. Perfectly fine to f/u w/ his primary GI as outpatient and do there ([facility]). Will remain available prn. Thanks   Admitted 10/28/2022  Allergies: Aspirin, Contrast media [iodinated contrast media], Fentanyl, Morphine and codeine, Oxycontin [oxycodone hcl], Shellfish allergy, and Diazepam Pertinent Hx: HTN, T2DM, bipolar, BPH  77 y.o. male p/w fatigue and melena  *Upper GI bleed: GI-EGD with large clean-based ulcers, nonbleeding.  IV PPI, full liquids, advance diet tomorrow, Carafate, repeat EGD in 8 weeks  Consults: GI  Meds: Iron, pantoprazole, rosuvastatin, sulcralfate VTE ppx: None IVF: LR diet: CLD

## 2022-10-28 NOTE — Anesthesia Preprocedure Evaluation (Addendum)
Anesthesia Evaluation  Patient identified by MRN, date of birth, ID band Patient awake    Reviewed: Allergy & Precautions, NPO status , Patient's Chart, lab work & pertinent test results  Airway Mallampati: II  TM Distance: >3 FB Neck ROM: Full    Dental no notable dental hx.    Pulmonary former smoker   Pulmonary exam normal        Cardiovascular hypertension,  Rhythm:Regular Rate:Normal     Neuro/Psych   Anxiety Depression Bipolar Disorder      GI/Hepatic negative GI ROS, Neg liver ROS,,,  Endo/Other  diabetes, Type 2, Oral Hypoglycemic Agents    Renal/GU Renal disease  negative genitourinary   Musculoskeletal  (+) Arthritis , Osteoarthritis,    Abdominal Normal abdominal exam  (+)   Peds  Hematology  (+) Blood dyscrasia, anemia Lab Results      Component                Value               Date                      WBC                      14.2 (H)            10/28/2022                HGB                      7.1 (L)             10/28/2022                HCT                      21.0 (L)            10/28/2022                MCV                      88.0                10/28/2022                PLT                      188                 10/28/2022              Anesthesia Other Findings   Reproductive/Obstetrics                             Anesthesia Physical Anesthesia Plan  ASA: 2  Anesthesia Plan: MAC   Post-op Pain Management:    Induction: Intravenous  PONV Risk Score and Plan: 1 and Propofol infusion and Treatment may vary due to age or medical condition  Airway Management Planned: Simple Face Mask and Nasal Cannula  Additional Equipment: None  Intra-op Plan:   Post-operative Plan:   Informed Consent: I have reviewed the patients History and Physical, chart, labs and discussed the procedure including the risks, benefits and alternatives for the proposed anesthesia  with the patient or authorized representative who has indicated his/her understanding  and acceptance.     Dental advisory given  Plan Discussed with: CRNA  Anesthesia Plan Comments:        Anesthesia Quick Evaluation

## 2022-10-28 NOTE — Anesthesia Postprocedure Evaluation (Signed)
Anesthesia Post Note  Patient: Dwayne Vargas  Procedure(s) Performed: ESOPHAGOGASTRODUODENOSCOPY (EGD) BIOPSY     Patient location during evaluation: PACU Anesthesia Type: MAC Level of consciousness: awake and alert Pain management: pain level controlled Vital Signs Assessment: post-procedure vital signs reviewed and stable Respiratory status: spontaneous breathing, nonlabored ventilation, respiratory function stable and patient connected to nasal cannula oxygen Cardiovascular status: stable and blood pressure returned to baseline Postop Assessment: no apparent nausea or vomiting Anesthetic complications: no   No notable events documented.  Last Vitals:  Vitals:   10/28/22 1435 10/28/22 1450  BP:  124/89  Pulse: 72 82  Resp: 12   Temp:  36.7 C  SpO2: 100% 100%    Last Pain:  Vitals:   10/28/22 1412  TempSrc: Temporal  PainSc: 0-No pain                 Earl Lites P Tylik Treese

## 2022-10-28 NOTE — ED Provider Notes (Signed)
[place] EMERG[facility] DEPARTMENT AT [facility] Provider Note   CSN: [id number] Arrival date & time: 10/28/22  1914     History  Chief Complaint  Patient presents with   Nausea   Emesis   Diarrhea   Weakness    Dwayne Vargas is a 77 y.o. male.  Patient is a 77 year old male who presents with weakness associated nausea vomiting diarrhea.  On chart review, he has a history of hypertension, hyperlipidemia, diabetes, chronic kidney disease, bipolar disorder and prior penile cancer.  He reports over the last 2 days he has had frequent episodes of nausea and vomiting associated with watery diarrhea.  His emesis is nonbloody and nonbilious.  His diarrhea is dark but no noticeable blood.  He denies any associate abdominal pain.  No known fevers.  He has had some runny nose and coughing.  No shortness of breath.  He does report some intermittent chest pain to the center and left side of his chest.  Denies any current pain.  He said he had a fall about a month ago but no other recent falls.  He has had some problems with his memory.  He says he was sitting outside when EMS got there and he could not tell them where he lived although he says he was sitting out in front of his apartment.  He also tells me that he recently got divorced from his wife although per chart review, it says that he is widowed.  He denies any current dizziness.  No urinary symptoms.       Home Medications Prior to Admission medications   Medication Sig Start Date End Date Taking? Authorizing Provider  Bacillus Coagulans-Inulin (PROBIOTIC) 1-250 BILLION-MG CAPS Take by mouth.    [provider]  cyanocobalamin 1000 MCG tablet Take 1 tablet (1,000 mcg total) by mouth daily. 07/23/22   Pahwani, Kasandra Knudsen, MD  ferrous sulfate 325 (65 FE) MG tablet Take 1 tablet (325 mg total) by mouth daily with breakfast. 07/23/22   Pahwani, Rinka R, MD  JARDIANCE 25 MG TABS tablet Take 25 mg by mouth daily. 03/07/22   [provider]  lamoTRIgine (LAMICTAL) 200 MG tablet Take 100 mg by mouth at bedtime. 11/24/21   [provider]  metFORMIN (GLUCOPHAGE) 500 MG tablet Take 500 mg by mouth 2 (two) times daily. 03/07/22   [provider]  rosuvastatin (CRESTOR) 10 MG tablet Take 1 tablet (10 mg total) by mouth daily. 01/05/22   Carlos Levering, NP  traZODone (DESYREL) 50 MG tablet Take 100 mg by mouth at bedtime.     [provider]      Allergies    Aspirin, Contrast media [iodinated contrast media], Fentanyl, Morphine and codeine, Oxycontin [oxycodone hcl], Shellfish allergy, and Diazepam    Review of Systems   Review of Systems  Constitutional:  Negative for chills, diaphoresis, fatigue and fever.  HENT:  Positive for congestion and rhinorrhea. Negative for sneezing.   Eyes: Negative.   Respiratory:  Positive for cough. Negative for chest tightness and shortness of breath.   Cardiovascular:  Positive for chest pain. Negative for leg swelling.  Gastrointestinal:  Positive for diarrhea, nausea and vomiting. Negative for abdominal pain and blood in stool.  Genitourinary:  Negative for difficulty urinating, flank pain, frequency and hematuria.  Musculoskeletal:  Negative for arthralgias and back pain.  Skin:  Negative for rash.  Neurological:  Negative for dizziness, speech difficulty, weakness, numbness and headaches.  Psychiatric/Behavioral:  Positive for  confusion.     Physical Exam Updated Vital Signs BP (!) 134/58 (BP Location: Right Arm)   Pulse 75   Temp 98.3 F (36.8 C) (Oral)   Resp 12   SpO2 100%  Physical Exam Constitutional:      Appearance: He is well-developed.  HENT:     Head: Normocephalic and atraumatic.  Eyes:     Pupils: Pupils are equal, round, and reactive to light.  Cardiovascular:     Rate and Rhythm: Normal rate and regular rhythm.     Heart sounds: Normal heart sounds.  Pulmonary:     Effort: Pulmonary effort is normal. No respiratory  distress.     Breath sounds: Normal breath sounds. No wheezing or rales.  Chest:     Chest wall: No tenderness.  Abdominal:     General: Bowel sounds are normal.     Palpations: Abdomen is soft.     Tenderness: There is no abdominal tenderness. There is no guarding or rebound.  Musculoskeletal:        General: Normal range of motion.     Cervical back: Normal range of motion and neck supple.  Lymphadenopathy:     Cervical: No cervical adenopathy.  Skin:    General: Skin is warm and dry.     Findings: No rash.  Neurological:     General: No focal deficit present.     Mental Status: He is alert and oriented to person, place, and time.     Comments: Motor 5 out of 5 in all extremities other than 4 out of 5 in the right lower extremity which the patient attributes to her knee problems.  Sensation grossly intact to light touch all extremities, cranial nerves II through XII grossly intact     ED Results / Procedures / Treatments   Labs (all labs ordered are listed, but only abnormal results are displayed) Labs Reviewed  COMPREHENSIVE METABOLIC PANEL - Abnormal; Notable for the following components:      Result Value   CO2 20 (*)    Glucose, Bld 129 (*)    BUN 54 (*)    Creatinine, Ser 1.43 (*)    Total Protein 5.7 (*)    GFR, Estimated 50 (*)    All other components within normal limits  CBC WITH DIFFERENTIAL/PLATELET - Abnormal; Notable for the following components:   WBC 14.2 (*)    RBC 2.41 (*)    Hemoglobin 6.9 (*)    HCT 21.2 (*)    RDW 16.6 (*)    Neutro Abs 12.1 (*)    Abs Immature Granulocytes 0.15 (*)    All other components within normal limits  I-STAT CHEM 8, ED - Abnormal; Notable for the following components:   BUN 54 (*)    Creatinine, Ser 1.30 (*)    Glucose, Bld 124 (*)    TCO2 19 (*)    Hemoglobin 7.1 (*)    HCT 21.0 (*)    All other components within normal limits  POC OCCULT BLOOD, ED - Abnormal; Notable for the following components:   Fecal Occult  Bld POSITIVE (*)    All other components within normal limits  SARS CORONAVIRUS 2 BY RT PCR  CK  URINALYSIS, W/ REFLEX TO CULTURE (INFECTION SUSPECTED)  TYPE AND SCREEN  PREPARE RBC (CROSSMATCH)  TROPONIN I (HIGH SENSITIVITY)  TROPONIN I (HIGH SENSITIVITY)    EKG EKG Interpretation Date/Time:  Sunday October 28 2022 07:01:54 EDT Ventricular Rate:  80 PR Interval:  178 QRS Duration:  103 QT Interval:  383 QTC Calculation: 442 R Axis:   26  Text Interpretation: Sinus rhythm Abnormal R-wave progression, early transition Borderline T abnormalities, inferior leads since last tracing no significant change Confirmed by Rolan Bucco [phone]) on 10/28/2022 10:51:42 AM  Radiology DG Chest 2 View  Result Date: 10/28/2022 CLINICAL DATA:  Cough. EXAM: CHEST - 2 VIEW COMPARISON:  11/20/2021 FINDINGS: The lungs are clear without focal pneumonia, edema, pneumothorax or pleural effusion. The cardiopericardial silhouette is within normal limits for size. No acute bony abnormality. Telemetry leads overlie the chest. IMPRESSION: No active cardiopulmonary disease. Electronically Signed   By: Kennith Center M.D.   On: 10/28/2022 09:00   CT Head Wo Contrast  Result Date: 10/28/2022 CLINICAL DATA:  77 year old male with altered mental status.  Cough. EXAM: CT HEAD WITHOUT CONTRAST TECHNIQUE: Contiguous axial images were obtained from the base of the skull through the vertex without intravenous contrast. RADIATION DOSE REDUCTION: This exam was performed according to the departmental dose-optimization program which includes automated exposure control, adjustment of the mA and/or kV according to patient size and/or use of iterative reconstruction technique. COMPARISON:  Brain MRI 07/20/2022.  Head CT 03/11/2022. FINDINGS: Brain: Stable cerebral volume. No midline shift, ventriculomegaly, mass effect, evidence of mass lesion, intracranial hemorrhage or evidence of cortically based acute infarction. Stable patchy  bilateral cerebral white matter hypodensity, moderate for age. Vascular: No suspicious intracranial vascular hyperdensity. Calcified atherosclerosis at the skull base. Skull: No acute osseous abnormality identified. Sinuses/Orbits: Visualized paranasal sinuses and mastoids are clear. Other: No acute orbit or scalp soft tissue finding. IMPRESSION: 1. No acute intracranial abnormality. 2. Chronic cerebral white matter disease. Electronically Signed   By: Odessa Fleming M.D.   On: 10/28/2022 08:16    Procedures Procedures    Medications Ordered in ED Medications  0.9 %  sodium chloride infusion (Manually program via Guardrails IV Fluids) (has no administration in time range)  pantoprazole (PROTONIX) 80 mg /NS 100 mL IVPB (80 mg Intravenous New Bag/Given 10/28/22 1014)    ED Course/ Medical Decision Making/ A&P                                 Medical Decision Making Amount and/or Complexity of Data Reviewed Labs: ordered. Radiology: ordered.  Risk Prescription drug management. Decision regarding hospitalization.   Patient is a 77 year old male who presents with diffuse weakness associated with some loose stools and vomiting.  He does not have any blood in his emesis per his report but he does say has been having some dark stools.  His hemoglobin is 6.9 which is a change from his baseline based on chart review.  Hemoccult was positive and stool was black at that time.  He does not have any associate abdominal pain which would warrant emergent imaging.  He is also confused which is a change per family member.  Chest x-ray was interpreted by me confirmed by the biologist to show no evidence of acute process.  No pneumonia.  Head CT does not show any acute abnormality.  Other labs are nonconcerning.  I did speak with [facility] who will consult on the patient.  He has been typed and crossed for 1 unit of packed red cells.  His daughter has been updated.  Will plan admission to the medicine service.   Discussed with the internal medicine teaching service.  CRITICAL CARE Performed by: Rolan Bucco Total  critical care time: 60 minutes Critical care time was exclusive of separately billable procedures and treating other patients. Critical care was necessary to treat or prevent imminent or life-threatening deterioration. Critical care was time spent personally by me on the following activities: development of treatment plan with patient and/or surrogate as well as nursing, discussions with consultants, evaluation of patient's response to treatment, examination of patient, obtaining history from patient or surrogate, ordering and performing treatments and interventions, ordering and review of laboratory studies, ordering and review of radiographic studies, pulse oximetry and re-evaluation of patient's condition.   Final Clinical Impression(s) / ED Diagnoses Final diagnoses:  Gastrointestinal hemorrhage, unspecified gastrointestinal hemorrhage type  Iron deficiency anemia, unspecified iron deficiency anemia type    Rx / DC Orders ED Discharge Orders     None         Rolan Bucco, MD 10/28/22 1110

## 2022-10-28 NOTE — Op Note (Signed)
[facility] Patient Name: Dwayne Vargas Procedure Date : 10/28/2022 MRN: [id number] Attending MD: Doristine Locks , MD, [id number] Date of Birth: 07/05/1945 CSN: [id number] Age: 77 Admit Type: Outpatient Procedure:                Upper GI endoscopy Indications:              Acute post hemorrhagic anemia, Heme positive stool,                            Melena Providers:                Doristine Locks, MD, Marja Kays, Technician Referring MD:              Medicines:                Monitored Anesthesia Care Complications:            No immediate complications. Estimated Blood Loss:     Estimated blood loss was minimal. Procedure:                Pre-Anesthesia Assessment:                           - Prior to the procedure, a History and Physical                            was performed, and patient medications and                            allergies were reviewed. The patient's tolerance of                            previous anesthesia was also reviewed. The risks                            and benefits of the procedure and the sedation                            options and risks were discussed with the patient.                            All questions were answered, and informed consent                            was obtained. Prior Anticoagulants: The patient has                            taken no anticoagulant or antiplatelet agents. ASA                            Grade Assessment: III - A patient with severe                            systemic disease. After reviewing the risks and  benefits, the patient was deemed in satisfactory                            condition to undergo the procedure.                           After obtaining informed consent, the endoscope was                            passed under direct vision. Throughout the                            procedure, the patient's blood pressure, pulse, and                             oxygen saturations were monitored continuously. The                            [id number]-[id number] ([id number]) Olympus endoscope was introduced                            through the mouth, and advanced to the third part                            of duodenum. The upper GI endoscopy was                            accomplished without difficulty. The patient                            tolerated the procedure well. Scope In: Scope Out: Findings:      A 2 cm sliding type hiatal hernia was present.      A non-obstructing Schatzki ring was found in the lower third of the       esophagus.      One non-bleeding cratered gastric ulcer with no stigmata of bleeding was       found at the incisura. The lesion was 10 mm in largest dimension.       Biopsies were taken with a cold forceps for histology. Estimated blood       loss was minimal.      Localized inflammation characterized by congestion (edema), erythema and       two small, shallow ulcerations was found in the gastric antrum.       Additional biopsies were taken with a cold forceps for histology and       Helicobacter pylori testing. Estimated blood loss was minimal.      The gastric fundus and gastric body were normal.      Two non-bleeding superficial duodenal ulcers with no stigmata of       bleeding were found in the duodenal bulb. The largest lesion was 5 mm in       largest dimension.      The ampulla, second portion of the duodenum and third portion of the       duodenum were normal. Impression:               - 2  cm hiatal hernia.                           - Non-obstructing Schatzki ring.                           - Non-bleeding gastric ulcer with no stigmata of                            bleeding. Biopsied.                           - Antral gastritis with 2 small, shallow ulcers.                            Biopsied.                           - Normal gastric fundus and gastric body.                           - Non-bleeding duodenal  ulcers with no stigmata of                            bleeding.                           - Normal ampulla, second portion of the duodenum                            and third portion of the duodenum. Recommendation:           - Return patient to hospital ward for ongoing care.                           - Full liquid diet today.                           - Use Protonix (pantoprazole) 40 mg IV BID today.                            If no evidence of rebleeding, can transition to 40                            mg PO BID tomorrow and continue for 8 weeks, then                            reduce to 40 mg daily.                           - Await pathology results.                           - Use sucralfate suspension 1 gram PO QID for 4  weeks.                           - Repeat upper endoscopy in 8 weeks to check                            healing.                           - Continue serial CBC checks with additional blood                            products as needed.                           - Check iron panel.                           - Inpatient GI service will sign off at this time.                            Please do not hesitate to contact us with                            additional questions or concerns. Procedure Code(s):        --- Professional ---                           [phone], Esophagogastroduodenoscopy, flexible,                            transoral; with biopsy, single or multiple Diagnosis Code(s):        --- Professional ---                           K44.9, Diaphragmatic hernia without obstruction or                            gangrene                           K22.2, Esophageal obstruction                           K25.9, Gastric ulcer, unspecified as acute or                            chronic, without hemorrhage or perforation                           K29.70, Gastritis, unspecified, without bleeding                           K26.9,  Duodenal ulcer, unspecified as acute or                            chronic, without hemorrhage or perforation  D62, Acute posthemorrhagic anemia                           R19.5, Other fecal abnormalities                           K92.1, Melena (includes Hematochezia) CPT copyright 2022 [employer]. All rights reserved. The codes documented in this report are preliminary and upon coder review may  be revised to meet current compliance requirements. Doristine Locks, MD 10/28/2022 2:21:54 PM Number of Addenda: 0

## 2022-10-28 NOTE — Interval H&P Note (Signed)
History and Physical Interval Note:  10/28/2022 1:29 PM  Dwayne Vargas  has presented today for surgery, with the diagnosis of upper gi bleed, symptomatic anemia, melena, nausea/vomiting.  The various methods of treatment have been discussed with the patient and family. After consideration of risks, benefits and other options for treatment, the patient has consented to  Procedure(s): ESOPHAGOGASTRODUODENOSCOPY (EGD) (N/A) as a surgical intervention.  The patient's history has been reviewed, patient examined, no change in status, stable for surgery.  I have reviewed the patient's chart and labs.  Questions were answered to the patient's satisfaction.     Verlin Dike Najmah Carradine

## 2022-10-28 NOTE — Consult Note (Addendum)
Attending physician's note   I have taken a history, reviewed the chart, and examined the patient. I performed a substantive portion of this encounter, including complete performance of at least one of the key components, in conjunction with the APP. I agree with the APP's note, impression, and recommendations with my edits.   77 year old male with medical history as outlined below presents with symptomatic anemia, melena, and heme positive stool.  Was taken to the ER via EMS with AMS.  Has been having nausea/vomiting and dark stools over the last couple of days.  Admission evaluation notable for the following: - WBC 14.2 - H/H 6.9/21.2 with MCV/RDW 80/16.6  - BUN/creatinine 54/1.43 (baseline ~22/1.1) - FOBT positive - Normal/negative CK, troponin x 2  H/H was essentially normal in 10/2021 at 14.5/42.7 with MCV/RDW 85/13.  Appears to have had a slow downtrend since early January.  Labs in 06/2022 with ferritin 48, iron 26, iron sat 7%, B12 275.  Has had colonoscopy but no prior EGD.  1) Symptomatic anemia 2) Heme positive stool - Transfusing RBCs now - Repeat iron panel - Plan for urgent EGD today for diagnostic and therapeutic intent - Started on high-dose PPI on arrival - Continue serial CBC checks with additional blood products as needed per protocol  3) Diarrhea Unclear how acute this is.  Per chart review has had chronic diarrhea for some time which has been previously evaluated by his outpatient GI.  Nonetheless, in the setting of leukocytosis and AMS, certainly reasonable to do infectious workup. - Stool studies  4) AMS 5) Leukocytosis - Additional workup per primary Hospitalist service    Doristine Locks, [place], [place] [phone] office          Consultation  Referring Provider:  [facility]  Primary Care Physician:  Mila Palmer, MD Primary Gastroenterologist:  [facility]       Reason for Consultation: UGIB      LOS: 0 days          HPI:   Dwayne Vargas is a 77 y.o. male with past medical history significant for CKD, penile cancer, diabetes, hypertension, presents for evaluation of upper GI bleed.  Patient presented to ED with weakness, nausea, vomiting, and diarrhea ongoing for 2 to 3 days.  When Dwayne Vargas initially presented to ED Dwayne Vargas reports Dwayne Vargas was confused.  Per chart review EMS picked patient up outside and patient could not tell them where Dwayne Vargas lived given that Dwayne Vargas was sitting outside of his apartment.  Patient reports Dwayne Vargas has had dark stools ongoing for months to years.  Takes Pepto as needed but has not taken any recently.  Denies iron use.  Denies NSAID use.  States over the last 2 days Dwayne Vargas has had progressive nausea and vomiting and overall not feeling well.  Denies abdominal pain.  Denies fever/chills.  Reports vomit was nonbloody bilious emesis.  Denies previous EGDs.  Reports colonoscopy in the past (2016).  Thinks Dwayne Vargas did not have polyps.  Denies family history of colon cancer.  Upon chart review appears patient has had chronic diarrhea since at least 2016 and has been followed with [facility] for same.  Has had extensive workup including stool studies and colonoscopy which have been unrevealing.  Dwayne Vargas did a trial of Xifaxan in 2017 which initially provided.  There is no longer efficacious.  Does have a very remote history of Blastocystis hominis infection.  Pertinent workup -Hgb 6.9 (11.1 3 months ago).  Baseline 10-11 - MCV 88.0 - BUN 54, creatinine 1.43, GFR 50 -Fecal occult positive - CT head no acute abnormality, chronic cerebral white matter disease - Chest x-ray unrevealing - Hemodynamically stable with BP 137/65, pulse 74 Patient was given PPI infusion and put up on IVF.  Has not had anything to eat or drink.  PREVIOUS GI WORKUP   Colonoscopy with [facility] 2016 done for diarrhea: Per chart review colonoscopy was normal.  Past Medical History:  Diagnosis Date   Anxiety    Benign localized  prostatic hyperplasia with lower urinary tract symptoms (LUTS)    Bipolar 1 disorder (HCC)    Complication of anesthesia    limited neck motion due to cervical spine surgery   Depression    ED (erectile dysfunction) of organic origin    History of penile cancer    s/p  excision of cancer 2013   HOH (hard of hearing)    left ear   Hypertension    Irritable bowel    intermittant diarrhea  secondary to antibiotics   Legally blind in left eye, as defined in [place]    Stricture, prostate    urethra   Type 2 diabetes mellitus (HCC)    Weak urinary stream    intermittant    Surgical History:  Dwayne Vargas  has a past surgical history that includes Total knee arthroplasty (Bilateral, left 2007/  right and revision in 2009); Video assisted thoracoscopy (vats)/thorocotomy (Left, 08/12/2014); Nasal septum surgery (x2  last one 1990's); Transurethral resection of prostate (N/A, 07/22/2015); Cervical fusion (05/ 2016   [facility]); Shoulder arthroscopy with rotator cuff repair (Left, 1990's); Cataract extraction w/ intraocular lens  implant, bilateral (2007); Cholecystectomy (2013  approx); EXCISION PENILE CANCER (2013); and Transurethral incision of prostate (N/A, 11/21/2015). Family History:  His family history includes Diabetes in his cousin. Social History:   reports that Dwayne Vargas quit smoking about 5 years ago. His smoking use included cigarettes. Dwayne Vargas started smoking about 58 years ago. Dwayne Vargas has a 53 pack-year smoking history. Dwayne Vargas has never used smokeless tobacco. Dwayne Vargas reports that Dwayne Vargas does not drink alcohol and does not use drugs.  Prior to Admission medications   Medication Sig Start Date End Date Taking? Authorizing Provider  Bacillus Coagulans-Inulin (PROBIOTIC) 1-250 BILLION-MG CAPS Take by mouth.    [provider]  cyanocobalamin 1000 MCG tablet Take 1 tablet (1,000 mcg total) by mouth daily. 07/23/22   Pahwani, Kasandra Knudsen, MD  ferrous sulfate 325 (65 FE) MG tablet Take 1 tablet (325 mg total) by mouth daily with  breakfast. 07/23/22   Pahwani, Rinka R, MD  JARDIANCE 25 MG TABS tablet Take 25 mg by mouth daily. 03/07/22   [provider]  lamoTRIgine (LAMICTAL) 200 MG tablet Take 100 mg by mouth at bedtime. 11/24/21   [provider]  metFORMIN (GLUCOPHAGE) 500 MG tablet Take 500 mg by mouth 2 (two) times daily. 03/07/22   [provider]  rosuvastatin (CRESTOR) 10 MG tablet Take 1 tablet (10 mg total) by mouth daily. 01/05/22   Carlos Levering, NP  traZODone (DESYREL) 50 MG tablet Take 100 mg by mouth at bedtime.     [provider]    No current facility-administered medications for this encounter.   Current Outpatient Medications  Medication Sig Dispense Refill   Bacillus Coagulans-Inulin (PROBIOTIC) 1-250 BILLION-MG CAPS Take by mouth.     cyanocobalamin 1000 MCG tablet Take 1 tablet (1,000 mcg total) by mouth daily. 30 tablet 0   ferrous  sulfate 325 (65 FE) MG tablet Take 1 tablet (325 mg total) by mouth daily with breakfast.  3   JARDIANCE 25 MG TABS tablet Take 25 mg by mouth daily.     lamoTRIgine (LAMICTAL) 200 MG tablet Take 100 mg by mouth at bedtime.     metFORMIN (GLUCOPHAGE) 500 MG tablet Take 500 mg by mouth 2 (two) times daily.     rosuvastatin (CRESTOR) 10 MG tablet Take 1 tablet (10 mg total) by mouth daily. 90 tablet 3   traZODone (DESYREL) 50 MG tablet Take 100 mg by mouth at bedtime.       Allergies as of 10/28/2022 - Review Complete 10/28/2022  Allergen Reaction Noted   Aspirin Other (See Comments) 09/15/2014   Contrast media [iodinated contrast media] Hives and Swelling 11/18/2015   Fentanyl  07/07/2015   Morphine and codeine Other (See Comments) 08/10/2014   Oxycontin [oxycodone hcl] Other (See Comments) 07/07/2015   Shellfish allergy Hives 11/18/2015   Diazepam Other (See Comments) 09/15/2014    Review of Systems  Constitutional:  Positive for malaise/fatigue. Negative for chills, fever and weight loss.  HENT:  Negative for hearing  loss and tinnitus.   Eyes:  Negative for blurred vision and double vision.  Respiratory:  Negative for cough and hemoptysis.   Cardiovascular:  Negative for chest pain and palpitations.  Gastrointestinal:  Positive for diarrhea, melena, nausea and vomiting. Negative for abdominal pain, blood in stool, constipation and heartburn.  Genitourinary:  Negative for dysuria and urgency.  Musculoskeletal:  Negative for myalgias and neck pain.  Skin:  Negative for itching and rash.  Neurological:  Negative for seizures and loss of consciousness.  Psychiatric/Behavioral:  Negative for depression and suicidal ideas.        Physical Exam:  Vital signs in last 24 hours: Temp:  [97.9 F (36.6 C)-98.3 F (36.8 C)] 98.2 F (36.8 C) (09/01 1131) Pulse Rate:  [74-80] 74 (09/01 1125) Resp:  [12-18] 16 (09/01 1125) BP: (126-148)/(58-79) 137/65 (09/01 1125) SpO2:  [100 %] 100 % (09/01 1057) Weight:  [86.2 kg] 86.2 kg (09/01 1131)   Last BM recorded by nurses in past 5 days No data recorded  Physical Exam Constitutional:      General: Dwayne Vargas is not in acute distress. HENT:     Head: Normocephalic and atraumatic.     Nose: Nose normal. No congestion.     Mouth/Throat:     Mouth: Mucous membranes are dry.  Eyes:     General: No scleral icterus.    Comments: Conjunctival pallor  Cardiovascular:     Rate and Rhythm: Normal rate and regular rhythm.  Pulmonary:     Effort: Pulmonary effort is normal. No respiratory distress.  Abdominal:     General: Bowel sounds are normal. There is no distension.     Palpations: Abdomen is soft. There is no mass.     Tenderness: There is no abdominal tenderness. There is no guarding or rebound.     Hernia: No hernia is present.  Musculoskeletal:        General: Normal range of motion.     Cervical back: Normal range of motion and neck supple.  Skin:    General: Skin is warm and dry.     Coloration: Skin is pale.  Neurological:     General: No focal deficit  present.     Mental Status: Dwayne Vargas is alert and oriented to person, place, and time.  Psychiatric:  Mood and Affect: Mood normal.        Behavior: Behavior normal.        Thought Content: Thought content normal.        Judgment: Judgment normal.      LAB RESULTS: Recent Labs    10/28/22 0726 10/28/22 0800  WBC 14.2*  --   HGB 6.9* 7.1*  HCT 21.2* 21.0*  PLT 188  --    BMET Recent Labs    10/28/22 0726 10/28/22 0800  NA 140 140  K 3.8 3.9  CL 108 110  CO2 20*  --   GLUCOSE 129* 124*  BUN 54* 54*  CREATININE 1.43* 1.30*  CALCIUM 9.4  --    LFT Recent Labs    10/28/22 0726  PROT 5.7*  ALBUMIN 3.5  AST 21  ALT 20  ALKPHOS 41  BILITOT 0.9   PT/INR No results for input(s): "LABPROT", "INR" in the last 72 hours.  STUDIES: DG Chest 2 View  Result Date: 10/28/2022 CLINICAL DATA:  Cough. EXAM: CHEST - 2 VIEW COMPARISON:  11/20/2021 FINDINGS: The lungs are clear without focal pneumonia, edema, pneumothorax or pleural effusion. The cardiopericardial silhouette is within normal limits for size. No acute bony abnormality. Telemetry leads overlie the chest. IMPRESSION: No active cardiopulmonary disease. Electronically Signed   By: Kennith Center M.D.   On: 10/28/2022 09:00   CT Head Wo Contrast  Result Date: 10/28/2022 CLINICAL DATA:  77 year old male with altered mental status.  Cough. EXAM: CT HEAD WITHOUT CONTRAST TECHNIQUE: Contiguous axial images were obtained from the base of the skull through the vertex without intravenous contrast. RADIATION DOSE REDUCTION: This exam was performed according to the departmental dose-optimization program which includes automated exposure control, adjustment of the mA and/or kV according to patient size and/or use of iterative reconstruction technique. COMPARISON:  Brain MRI 07/20/2022.  Head CT 03/11/2022. FINDINGS: Brain: Stable cerebral volume. No midline shift, ventriculomegaly, mass effect, evidence of mass lesion, intracranial  hemorrhage or evidence of cortically based acute infarction. Stable patchy bilateral cerebral white matter hypodensity, moderate for age. Vascular: No suspicious intracranial vascular hyperdensity. Calcified atherosclerosis at the skull base. Skull: No acute osseous abnormality identified. Sinuses/Orbits: Visualized paranasal sinuses and mastoids are clear. Other: No acute orbit or scalp soft tissue finding. IMPRESSION: 1. No acute intracranial abnormality. 2. Chronic cerebral white matter disease. Electronically Signed   By: Odessa Fleming M.D.   On: 10/28/2022 08:16      Impression    77 year old male with history of CKD, penile cancer, hypertension, chronic diarrhea remote history of blasto cytosis hominis infection and extensive negative workup including colonoscopy and hydrogen breath test, presents for evaluation of nausea, vomiting, diarrhea with melena and symptomatic anemia.  Symptomatic anemia Melena -Hgb 6.9 (11.1 3 months ago).  Baseline 10-11 in last few months with 13.0 January 2024.  Currently receiving 1 unit PRBCs - MCV 88.0 - BUN 54, creatinine 1.43, GFR 50 -Fecal occult positive - CT head no acute abnormality, chronic cerebral white matter disease - Chest x-ray unrevealing - Hemodynamically stable with BP 137/65, pulse 74 Patient reports chronic melena.  Denies current Pepto use.  No previous EGD.  Symptomatic anemia with significant drop in hemoglobin over the last 3 months (slow drift from January 2024).  And positive fecal occult.  Nausea and vomiting Diarrhea (acute on chronic?) Stable electrolytes.  Nausea, vomiting, diarrhea could be infectious process especially with leukocytosis (WBC 14.2).  However, diarrhea appears chronic with multiple visits with [facility] for  same and extensive workup.  Difficult historian.  Patient could also have nausea and vomiting secondary to esophagitis/gastritis.  Memory loss Intermittent memory loss.  Currently alert and oriented.   Plan   -  EGD today if anesthesia schedule allows - I thoroughly discussed the procedure with the patient (at bedside) to include nature of the procedure, alternatives, benefits, and risks (including but not limited to bleeding, infection, perforation, anesthesia/cardiac pulmonary complications).  Patient verbalized understanding and gave verbal consent to proceed with procedure. - Continue PPI infusion - Protonix 40 Mg IV twice daily - NPO - GI pathogen panel and C. difficile - Continue supportive care - Continue daily CBC and transfuse as needed to maintain HGB > 7   Thank you for your kind consultation, we will continue to follow.   Dwayne Vargas  10/28/2022, 11:39 AM

## 2022-10-28 NOTE — ED Triage Notes (Signed)
Pt bib [facility] from home c/o N/V/D and weakness x2 days. Pt stated he felt confused and was going to pass out this morning. Pt pale with ems.  20 g R hand ems gave NS Bp -120/74 Hr- 60 100% RA 228 CBG

## 2022-10-28 NOTE — ED Notes (Signed)
Pt to ct 

## 2022-10-28 NOTE — H&P (Cosign Needed Addendum)
Date: 10/28/2022               Patient Name:  Dwayne Vargas MRN: [id number]  DOB: 1946-01-25 Age / Sex: 77 y.o., male   PCP: Mila Palmer, MD         Medical Service: Internal Medicine Teaching Service         Attending Physician: Dr. Earl Lagos, MD      First Contact: Dr. Lovie Macadamia MD Pager [phone]    Second Contact: Dr. Rocky Morel, DO Pager [phone]         After Hours (After 5p/  First Contact Pager: [phone]  weekends / holidays): Second Contact Pager: [phone]   SUBJECTIVE   Chief Complaint: Nausea, vomiting, diarrhea and weakness  History of Present Illness:  Dwayne Vargas is 77 yo with PMHx of IBS-D, Bipolar I, T2DM, hypertension, hyperlipidemia, BPH with lower urinary tract symptoms and hard of hearing presenting with nausea, vomiting, diarrhea and weakness.  Patient was brought to [facility] ED via EMS.  This morning around 5 AM he could no longer tolerate the weakness.  Per chart review patient called EMS and was sitting outside of his apartment but did not know where he lived.  Patient presents with 2 to 3 days of nausea, vomiting, diarrhea and weakness.  Vomiting and nausea occurred 3-4 times daily.  Vomit was green and yellow with no hematochezia.  He could not tolerate eating or drinking without subsequent vomiting.  Diarrhea was described as loose stools which were dark.  His last bowel movement was this morning; He has not had a bowel movement since being in the hospital.  He denied any abdominal pain.  His weakness progressively became worse over the past 2 to 3 days.  He was able to walk around his house by holding on to furniture.  Along with the weakness he had dizziness/lightheadedness.  Had several near falls but never fell.  He denied syncope or loss of consciousness.  He has new onset confusion that began 2 to 3 days ago with this episode of GI symptoms.  He denies any urinary changes.  Endorses mild headache and rhinitis which he attributes to  allergies.  Has never experienced anything like this before.  He denies frequent use of NSAIDs or aspirin.  Per chart review he has had diarrhea since 2016 and saw [facility].  In October of 2016 he had a colonoscopy with random colon biopsies which showed no abnormalities.  In February 2017 he had an infectious GI workup with a GI PP, C. difficile and ova and parasites which were all negative.  He has been treated with rifaximin, Lomotil and dicyclomine.   Recently been experiencing mild chest pain which he describes as pressure.  Not exacerbated with exercise.  Reports that he is seeing [facility] cardiology this Friday.   ED Course: Patient presented to [facility] ED via EMS for nausea, vomiting, diarrhea and weakness. He was hypertensive on admission, but otherwise his vitals were stable.His Hgb 6.9 (baseline 10-11), WBC 14.2, Cr 1.30, BUN 54, total protein 5.7, CO2 19, and FOBT positive. CXR and CT without contrast show no acute abnormalities. He was given 1 unit of pRBC, a PPI and started on IV fluids. GI was consulted.   Meds:  Ferrous sulfate 325 mg daily Rosuvastatin 10mg  once daily Metformin 500 mg daily Meloxicam 15 mg daily as needed  Past Medical History Type 2 diabetes Hypertension Hyperlipidemia Bipolar I IBS-diarrhea History of Blastocystis hominis infection  Penile cancer status post excision in 2013 BPH with lower urinary tract symptoms Stricture of prostate urethra Anxiety Hard of hearing left ear Legally blind left eye  Past Surgical History:  Procedure Laterality Date   CATARACT EXTRACTION W/ INTRAOCULAR LENS  IMPLANT, BILATERAL  2007   CERVICAL FUSION  05/ 2016   [facility]   C2 -- C4   CHOLECYSTECTOMY  2013  approx   EXCISION PENILE CANCER  2013   NASAL SEPTUM SURGERY  x2  last one 1990's   SHOULDER ARTHROSCOPY WITH ROTATOR CUFF REPAIR Left 1990's   TOTAL KNEE ARTHROPLASTY Bilateral left 2007/  right and revision in 2009   TRANSURETHRAL INCISION OF  PROSTATE N/A 11/21/2015   Procedure: TRANSURETHRAL INCISION OF THE PROSTATE (TUIP);  Surgeon: Hildred Laser, MD;  Location: [facility];  Service: Urology;  Laterality: N/A;   TRANSURETHRAL RESECTION OF PROSTATE N/A 07/22/2015   Procedure: TRANSURETHRAL RESECTION OF THE PROSTATE ;  Surgeon: Hildred Laser, MD;  Location: [facility];  Service: Urology;  Laterality: N/A;   VIDEO ASSISTED THORACOSCOPY (VATS)/THOROCOTOMY Left 08/12/2014   Procedure: VIDEO ASSISTED THORACOSCOPY (VATS)/THOROCOTOMY, DRAINAGE OF EMPYEMA;  Surgeon: Alleen Borne, MD;  Location: [facility] OR;  Service: Thoracic;  Laterality: Left;  Total right hip arthroplasty   Social:  Lives With: Alone in an apartment. Occupation: Retired principal Support: Daughter Level of Function: Can do ADLs and IADLs independently PCP: Dr. Mila Palmer Substances: Previously used tobacco and alcohol, but stopped alcohol in his late 26s early 30s and tobacco in 2016.  Denies use of illicit drugs.  Family History:  Grandfather: Heart attack Father: renal cancer Family history of colon cancer  Allergies: Allergies as of 10/28/2022 - Review Complete 10/28/2022  Allergen Reaction Noted   Aspirin Other (See Comments) 09/15/2014   Contrast media [iodinated contrast media] Hives and Swelling 11/18/2015   Fentanyl  07/07/2015   Morphine and codeine Other (See Comments) 08/10/2014   Oxycontin [oxycodone hcl] Other (See Comments) 07/07/2015   Shellfish allergy Hives 11/18/2015   Diazepam Other (See Comments) 09/15/2014    Review of Systems: A complete ROS was negative except as per HPI.   OBJECTIVE:   Physical Exam: Blood pressure (!) 149/62, pulse 79, temperature 98.2 F (36.8 C), temperature source Oral, resp. rate (!) 22, height 5\' 11"  (1.803 m), weight 86.2 kg, SpO2 100%.  Constitutional: well-appearing laying in bed, in no acute distress, pale appearance. HENT: normocephalic atraumatic, mucous membranes dry Eyes:  conjunctiva non-erythematous Neck: supple Cardiovascular: regular rate and rhythm, no m/r/g Pulmonary/Chest: normal work of breathing on room air, lungs clear to auscultation bilaterally Abdominal: soft, non-tender, non-distended MSK: normal bulk and tone Neurological: alert & oriented x 3, forgetful at times, 4/5 strength in right lower extremity (has had right lower extremity weakness since right hip replacement since 2019). 5/5 strength in left lower extremity and bilateral upper extremities.  Skin: warm and dry Psych: Pleasant mood.   Labs: CBC    Component Value Date/Time   WBC 14.2 (H) 10/28/2022 0726   RBC 2.41 (L) 10/28/2022 0726   HGB 7.1 (L) 10/28/2022 0800   HCT 21.0 (L) 10/28/2022 0800   PLT 188 10/28/2022 0726   MCV 88.0 10/28/2022 0726   MCH 28.6 10/28/2022 0726   MCHC 32.5 10/28/2022 0726   RDW 16.6 (H) 10/28/2022 0726   LYMPHSABS 1.2 10/28/2022 0726   MONOABS 0.6 10/28/2022 0726   EOSABS 0.1 10/28/2022 0726   BASOSABS 0.1 10/28/2022 0726  CMP     Component Value Date/Time   NA 140 10/28/2022 0800   NA 140 01/22/2022 1015   K 3.9 10/28/2022 0800   CL 110 10/28/2022 0800   CO2 20 (L) 10/28/2022 0726   GLUCOSE 124 (H) 10/28/2022 0800   BUN 54 (H) 10/28/2022 0800   BUN 20 01/22/2022 1015   CREATININE 1.30 (H) 10/28/2022 0800   CALCIUM 9.4 10/28/2022 0726   PROT 5.7 (L) 10/28/2022 0726   ALBUMIN 3.5 10/28/2022 0726   AST 21 10/28/2022 0726   ALT 20 10/28/2022 0726   ALKPHOS 41 10/28/2022 0726   BILITOT 0.9 10/28/2022 0726   GFRNONAA 50 (L) 10/28/2022 0726   GFRAA >60 07/23/2015 0520    Imaging:  EKG: personally reviewed my interpretation is sinus rhythm, abnormal R wave progression.  ASSESSMENT & PLAN:   Assessment & Plan by Problem: Principal Problem:   GI bleed   Dwayne Vargas is a 77 y.o. person living with a history of chronic diarrhea, type 2 diabetes, bipolar I, hypertension and hyperlipidemia, who presented with nausea, vomiting,  diarrhea, and weakness and is admitted for acute blood loss anemia with suspected upper GI bleed on hospital day 0  Upper GI bleed GI infection? Acute blood loss anemia Patient presents with diarrhea that was described as dark along with nausea, vomiting, weakness and confusion.  Blood pressure was mildly elevated on arrival, but has been on the lower side of normal the rest of the time.  All other vital remain within normal limits.  His hemoglobin was 6.9 and FOBT was positive. On exam he was pale, and abdomen was nontender and nondistended with present bowel sounds.  Suspect that melena, anemia and weakness are a result of an upper GI bleed.  Accompanying nausea and vomiting possibly due to GI infection; WBC 14.2.  Confusion and weakness likely due to decreased p.o. intake and GI bleed.  GI was consulted and recommended an EGD, Protonix 40 mg IV twice daily and GI pathogen panel. - EGD pending - GI pathogen panel - C. difficile toxin - Protonix 40 mg IV twice daily - Continue ferrous sulfate 325 mg  - Post transfusion H&H - Daily CBC, transfuse if hemoglobin below 7 - NPO until EGD complete - GI continue to follow  Type 2 diabetes mellitus Patient's home regimen is metformin 500 mg daily. -Continue metformin 500 mg daily  Hyperlipidemia Patient's home regimen is rosuvastatin 10 mg daily. -Continue rosuvastatin 10 mg daily  Bipolar 1 Patient reported not taking lamotrigine. Patient reported physician took him off due to dizziness. Not currently on any psychiatric medication.  Hypertension Patient reported he is not taking any medication hypertension anymore due to orthostatic hypotension and frequent falls.  Blood pressures on admission have mainly been low normal.  Diet: NPO VTE: SCDs IVF: None,None Code: Full  Prior to Admission Living Arrangement: Home, living alone Anticipated Discharge Location: Home Barriers to Discharge: GI bleed workup  Dispo: Admit patient to  Observation with expected length of stay less than 2 midnights.  Signed: Kristie Cowman, MS3  10/28/2022, 12:33 PM   Attestation for Student Documentation:  I personally was present and re-performed the history, physical exam and medical decision-making activities of this service and have verified that the service and findings are accurately documented in the student's note.  Rocky Morel, DO 10/28/2022, 4:05 PM

## 2022-10-28 NOTE — H&P (View-Only) (Signed)
Attending physician's note   I have taken a history, reviewed the chart, and examined the patient. I performed a substantive portion of this encounter, including complete performance of at least one of the key components, in conjunction with the APP. I agree with the APP's note, impression, and recommendations with my edits.   77 year old male with medical history as outlined below presents with symptomatic anemia, melena, and heme positive stool.  Was taken to the ER via EMS with AMS.  Has been having nausea/vomiting and dark stools over the last couple of days.  Admission evaluation notable for the following: - WBC 14.2 - H/H 6.9/21.2 with MCV/RDW 80/16.6  - BUN/creatinine 54/1.43 (baseline ~22/1.1) - FOBT positive - Normal/negative CK, troponin x 2  H/H was essentially normal in 10/2021 at 14.5/42.7 with MCV/RDW 85/13.  Appears to have had a slow downtrend since early January.  Labs in 06/2022 with ferritin 48, iron 26, iron sat 7%, B12 275.  Has had colonoscopy but no prior EGD.  1) Symptomatic anemia 2) Heme positive stool - Transfusing RBCs now - Repeat iron panel - Plan for urgent EGD today for diagnostic and therapeutic intent - Started on high-dose PPI on arrival - Continue serial CBC checks with additional blood products as needed per protocol  3) Diarrhea Unclear how acute this is.  Per chart review has had chronic diarrhea for some time which has been previously evaluated by his outpatient GI.  Nonetheless, in the setting of leukocytosis and AMS, certainly reasonable to do infectious workup. - Stool studies  4) AMS 5) Leukocytosis - Additional workup per primary Hospitalist service    Doristine Locks, [place], [place] [phone] office          Consultation  Referring Provider:  [facility]  Primary Care Physician:  Mila Palmer, MD Primary Gastroenterologist:  [facility]       Reason for Consultation: UGIB      LOS: 0 days          HPI:   Chanon Croes is a 77 y.o. male with past medical history significant for CKD, penile cancer, diabetes, hypertension, presents for evaluation of upper GI bleed.  Patient presented to ED with weakness, nausea, vomiting, and diarrhea ongoing for 2 to 3 days.  When he initially presented to ED he reports he was confused.  Per chart review EMS picked patient up outside and patient could not tell them where he lived given that he was sitting outside of his apartment.  Patient reports he has had dark stools ongoing for months to years.  Takes Pepto as needed but has not taken any recently.  Denies iron use.  Denies NSAID use.  States over the last 2 days he has had progressive nausea and vomiting and overall not feeling well.  Denies abdominal pain.  Denies fever/chills.  Reports vomit was nonbloody bilious emesis.  Denies previous EGDs.  Reports colonoscopy in the past (2016).  Thinks he did not have polyps.  Denies family history of colon cancer.  Upon chart review appears patient has had chronic diarrhea since at least 2016 and has been followed with [facility] for same.  Has had extensive workup including stool studies and colonoscopy which have been unrevealing.  He did a trial of Xifaxan in 2017 which initially provided.  There is no longer efficacious.  Does have a very remote history of Blastocystis hominis infection.  Pertinent workup -Hgb 6.9 (11.1 3 months ago).  Baseline 10-11 - MCV 88.0 - BUN 54, creatinine 1.43, GFR 50 -Fecal occult positive - CT head no acute abnormality, chronic cerebral white matter disease - Chest x-ray unrevealing - Hemodynamically stable with BP 137/65, pulse 74 Patient was given PPI infusion and put up on IVF.  Has not had anything to eat or drink.  PREVIOUS GI WORKUP   Colonoscopy with [facility] 2016 done for diarrhea: Per chart review colonoscopy was normal.  Past Medical History:  Diagnosis Date   Anxiety    Benign localized  prostatic hyperplasia with lower urinary tract symptoms (LUTS)    Bipolar 1 disorder (HCC)    Complication of anesthesia    limited neck motion due to cervical spine surgery   Depression    ED (erectile dysfunction) of organic origin    History of penile cancer    s/p  excision of cancer 2013   HOH (hard of hearing)    left ear   Hypertension    Irritable bowel    intermittant diarrhea  secondary to antibiotics   Legally blind in left eye, as defined in [place]    Stricture, prostate    urethra   Type 2 diabetes mellitus (HCC)    Weak urinary stream    intermittant    Surgical History:  He  has a past surgical history that includes Total knee arthroplasty (Bilateral, left 2007/  right and revision in 2009); Video assisted thoracoscopy (vats)/thorocotomy (Left, 08/12/2014); Nasal septum surgery (x2  last one 1990's); Transurethral resection of prostate (N/A, 07/22/2015); Cervical fusion (05/ 2016   [facility]); Shoulder arthroscopy with rotator cuff repair (Left, 1990's); Cataract extraction w/ intraocular lens  implant, bilateral (2007); Cholecystectomy (2013  approx); EXCISION PENILE CANCER (2013); and Transurethral incision of prostate (N/A, 11/21/2015). Family History:  His family history includes Diabetes in his cousin. Social History:   reports that he quit smoking about 5 years ago. His smoking use included cigarettes. He started smoking about 58 years ago. He has a 53 pack-year smoking history. He has never used smokeless tobacco. He reports that he does not drink alcohol and does not use drugs.  Prior to Admission medications   Medication Sig Start Date End Date Taking? Authorizing Provider  Bacillus Coagulans-Inulin (PROBIOTIC) 1-250 BILLION-MG CAPS Take by mouth.    [provider]  cyanocobalamin 1000 MCG tablet Take 1 tablet (1,000 mcg total) by mouth daily. 07/23/22   Pahwani, Kasandra Knudsen, MD  ferrous sulfate 325 (65 FE) MG tablet Take 1 tablet (325 mg total) by mouth daily with  breakfast. 07/23/22   Pahwani, Rinka R, MD  JARDIANCE 25 MG TABS tablet Take 25 mg by mouth daily. 03/07/22   [provider]  lamoTRIgine (LAMICTAL) 200 MG tablet Take 100 mg by mouth at bedtime. 11/24/21   [provider]  metFORMIN (GLUCOPHAGE) 500 MG tablet Take 500 mg by mouth 2 (two) times daily. 03/07/22   [provider]  rosuvastatin (CRESTOR) 10 MG tablet Take 1 tablet (10 mg total) by mouth daily. 01/05/22   Carlos Levering, NP  traZODone (DESYREL) 50 MG tablet Take 100 mg by mouth at bedtime.     [provider]    No current facility-administered medications for this encounter.   Current Outpatient Medications  Medication Sig Dispense Refill   Bacillus Coagulans-Inulin (PROBIOTIC) 1-250 BILLION-MG CAPS Take by mouth.     cyanocobalamin 1000 MCG tablet Take 1 tablet (1,000 mcg total) by mouth daily. 30 tablet 0   ferrous  sulfate 325 (65 FE) MG tablet Take 1 tablet (325 mg total) by mouth daily with breakfast.  3   JARDIANCE 25 MG TABS tablet Take 25 mg by mouth daily.     lamoTRIgine (LAMICTAL) 200 MG tablet Take 100 mg by mouth at bedtime.     metFORMIN (GLUCOPHAGE) 500 MG tablet Take 500 mg by mouth 2 (two) times daily.     rosuvastatin (CRESTOR) 10 MG tablet Take 1 tablet (10 mg total) by mouth daily. 90 tablet 3   traZODone (DESYREL) 50 MG tablet Take 100 mg by mouth at bedtime.       Allergies as of 10/28/2022 - Review Complete 10/28/2022  Allergen Reaction Noted   Aspirin Other (See Comments) 09/15/2014   Contrast media [iodinated contrast media] Hives and Swelling 11/18/2015   Fentanyl  07/07/2015   Morphine and codeine Other (See Comments) 08/10/2014   Oxycontin [oxycodone hcl] Other (See Comments) 07/07/2015   Shellfish allergy Hives 11/18/2015   Diazepam Other (See Comments) 09/15/2014    Review of Systems  Constitutional:  Positive for malaise/fatigue. Negative for chills, fever and weight loss.  HENT:  Negative for hearing  loss and tinnitus.   Eyes:  Negative for blurred vision and double vision.  Respiratory:  Negative for cough and hemoptysis.   Cardiovascular:  Negative for chest pain and palpitations.  Gastrointestinal:  Positive for diarrhea, melena, nausea and vomiting. Negative for abdominal pain, blood in stool, constipation and heartburn.  Genitourinary:  Negative for dysuria and urgency.  Musculoskeletal:  Negative for myalgias and neck pain.  Skin:  Negative for itching and rash.  Neurological:  Negative for seizures and loss of consciousness.  Psychiatric/Behavioral:  Negative for depression and suicidal ideas.        Physical Exam:  Vital signs in last 24 hours: Temp:  [97.9 F (36.6 C)-98.3 F (36.8 C)] 98.2 F (36.8 C) (09/01 1131) Pulse Rate:  [74-80] 74 (09/01 1125) Resp:  [12-18] 16 (09/01 1125) BP: (126-148)/(58-79) 137/65 (09/01 1125) SpO2:  [100 %] 100 % (09/01 1057) Weight:  [86.2 kg] 86.2 kg (09/01 1131)   Last BM recorded by nurses in past 5 days No data recorded  Physical Exam Constitutional:      General: He is not in acute distress. HENT:     Head: Normocephalic and atraumatic.     Nose: Nose normal. No congestion.     Mouth/Throat:     Mouth: Mucous membranes are dry.  Eyes:     General: No scleral icterus.    Comments: Conjunctival pallor  Cardiovascular:     Rate and Rhythm: Normal rate and regular rhythm.  Pulmonary:     Effort: Pulmonary effort is normal. No respiratory distress.  Abdominal:     General: Bowel sounds are normal. There is no distension.     Palpations: Abdomen is soft. There is no mass.     Tenderness: There is no abdominal tenderness. There is no guarding or rebound.     Hernia: No hernia is present.  Musculoskeletal:        General: Normal range of motion.     Cervical back: Normal range of motion and neck supple.  Skin:    General: Skin is warm and dry.     Coloration: Skin is pale.  Neurological:     General: No focal deficit  present.     Mental Status: He is alert and oriented to person, place, and time.  Psychiatric:  Mood and Affect: Mood normal.        Behavior: Behavior normal.        Thought Content: Thought content normal.        Judgment: Judgment normal.      LAB RESULTS: Recent Labs    10/28/22 0726 10/28/22 0800  WBC 14.2*  --   HGB 6.9* 7.1*  HCT 21.2* 21.0*  PLT 188  --    BMET Recent Labs    10/28/22 0726 10/28/22 0800  NA 140 140  K 3.8 3.9  CL 108 110  CO2 20*  --   GLUCOSE 129* 124*  BUN 54* 54*  CREATININE 1.43* 1.30*  CALCIUM 9.4  --    LFT Recent Labs    10/28/22 0726  PROT 5.7*  ALBUMIN 3.5  AST 21  ALT 20  ALKPHOS 41  BILITOT 0.9   PT/INR No results for input(s): "LABPROT", "INR" in the last 72 hours.  STUDIES: DG Chest 2 View  Result Date: 10/28/2022 CLINICAL DATA:  Cough. EXAM: CHEST - 2 VIEW COMPARISON:  11/20/2021 FINDINGS: The lungs are clear without focal pneumonia, edema, pneumothorax or pleural effusion. The cardiopericardial silhouette is within normal limits for size. No acute bony abnormality. Telemetry leads overlie the chest. IMPRESSION: No active cardiopulmonary disease. Electronically Signed   By: Kennith Center M.D.   On: 10/28/2022 09:00   CT Head Wo Contrast  Result Date: 10/28/2022 CLINICAL DATA:  77 year old male with altered mental status.  Cough. EXAM: CT HEAD WITHOUT CONTRAST TECHNIQUE: Contiguous axial images were obtained from the base of the skull through the vertex without intravenous contrast. RADIATION DOSE REDUCTION: This exam was performed according to the departmental dose-optimization program which includes automated exposure control, adjustment of the mA and/or kV according to patient size and/or use of iterative reconstruction technique. COMPARISON:  Brain MRI 07/20/2022.  Head CT 03/11/2022. FINDINGS: Brain: Stable cerebral volume. No midline shift, ventriculomegaly, mass effect, evidence of mass lesion, intracranial  hemorrhage or evidence of cortically based acute infarction. Stable patchy bilateral cerebral white matter hypodensity, moderate for age. Vascular: No suspicious intracranial vascular hyperdensity. Calcified atherosclerosis at the skull base. Skull: No acute osseous abnormality identified. Sinuses/Orbits: Visualized paranasal sinuses and mastoids are clear. Other: No acute orbit or scalp soft tissue finding. IMPRESSION: 1. No acute intracranial abnormality. 2. Chronic cerebral white matter disease. Electronically Signed   By: Odessa Fleming M.D.   On: 10/28/2022 08:16      Impression    77 year old male with history of CKD, penile cancer, hypertension, chronic diarrhea remote history of blasto cytosis hominis infection and extensive negative workup including colonoscopy and hydrogen breath test, presents for evaluation of nausea, vomiting, diarrhea with melena and symptomatic anemia.  Symptomatic anemia Melena -Hgb 6.9 (11.1 3 months ago).  Baseline 10-11 in last few months with 13.0 January 2024.  Currently receiving 1 unit PRBCs - MCV 88.0 - BUN 54, creatinine 1.43, GFR 50 -Fecal occult positive - CT head no acute abnormality, chronic cerebral white matter disease - Chest x-ray unrevealing - Hemodynamically stable with BP 137/65, pulse 74 Patient reports chronic melena.  Denies current Pepto use.  No previous EGD.  Symptomatic anemia with significant drop in hemoglobin over the last 3 months (slow drift from January 2024).  And positive fecal occult.  Nausea and vomiting Diarrhea (acute on chronic?) Stable electrolytes.  Nausea, vomiting, diarrhea could be infectious process especially with leukocytosis (WBC 14.2).  However, diarrhea appears chronic with multiple visits with [facility] for  same and extensive workup.  Difficult historian.  Patient could also have nausea and vomiting secondary to esophagitis/gastritis.  Memory loss Intermittent memory loss.  Currently alert and oriented.   Plan   -  EGD today if anesthesia schedule allows - I thoroughly discussed the procedure with the patient (at bedside) to include nature of the procedure, alternatives, benefits, and risks (including but not limited to bleeding, infection, perforation, anesthesia/cardiac pulmonary complications).  Patient verbalized understanding and gave verbal consent to proceed with procedure. - Continue PPI infusion - Protonix 40 Mg IV twice daily - NPO - GI pathogen panel and C. difficile - Continue supportive care - Continue daily CBC and transfuse as needed to maintain HGB > 7   Thank you for your kind consultation, we will continue to follow.   Bayley Leanna Sato  10/28/2022, 11:39 AM

## 2022-10-28 NOTE — ED Notes (Signed)
Pt returns from ct  

## 2022-10-28 NOTE — ED Notes (Addendum)
CRITICAL VALUE STICKER  CRITICAL VALUE:HGB 6.9  RECEIVER (on-site recipient of call): Loann Quill, Paramedic  DATE & TIME NOTIFIED: 10/28/2022, 08:36  MESSENGER (representative from lab): Lab  MD NOTIFIED: belfi  TIME OF NOTIFICATION:0840  RESPONSE:

## 2022-10-28 NOTE — Plan of Care (Signed)
  Problem: Education: Goal: Knowledge of General Education information will improve Description: Including pain rating scale, medication(s)/side effects and non-pharmacologic comfort measures Outcome: Progressing   Problem: Clinical Measurements: Goal: Ability to maintain clinical measurements within normal limits will improve Outcome: Progressing Goal: Will remain free from infection Outcome: Progressing Goal: Diagnostic test results will improve Outcome: Progressing Goal: Respiratory complications will improve Outcome: Progressing Goal: Cardiovascular complication will be avoided Outcome: Progressing   Problem: Nutrition: Goal: Adequate nutrition will be maintained Outcome: Progressing   Problem: Elimination: Goal: Will not experience complications related to bowel motility Outcome: Progressing Goal: Will not experience complications related to urinary retention Outcome: Progressing   Problem: Activity: Goal: Risk for activity intolerance will decrease Outcome: Progressing

## 2022-10-29 LAB — TYPE AND SCREEN
ABO/RH(D): A NEG
Antibody Screen: NEGATIVE
Unit division: 0

## 2022-10-29 LAB — CBC WITH DIFFERENTIAL/PLATELET
Abs Immature Granulocytes: 0.07 10*3/uL (ref 0.00–0.07)
Basophils Absolute: 0.1 10*3/uL (ref 0.0–0.1)
Basophils Relative: 1 %
Eosinophils Absolute: 0.3 10*3/uL (ref 0.0–0.5)
Eosinophils Relative: 3 %
HCT: 21.8 % — ABNORMAL LOW (ref 39.0–52.0)
Hemoglobin: 7.4 g/dL — ABNORMAL LOW (ref 13.0–17.0)
Immature Granulocytes: 1 %
Lymphocytes Relative: 19 %
Lymphs Abs: 1.6 10*3/uL (ref 0.7–4.0)
MCH: 30 pg (ref 26.0–34.0)
MCHC: 33.9 g/dL (ref 30.0–36.0)
MCV: 88.3 fL (ref 80.0–100.0)
Monocytes Absolute: 0.7 10*3/uL (ref 0.1–1.0)
Monocytes Relative: 8 %
Neutro Abs: 5.8 10*3/uL (ref 1.7–7.7)
Neutrophils Relative %: 68 %
Platelets: 138 10*3/uL — ABNORMAL LOW (ref 150–400)
RBC: 2.47 MIL/uL — ABNORMAL LOW (ref 4.22–5.81)
RDW: 16.1 % — ABNORMAL HIGH (ref 11.5–15.5)
WBC: 8.5 10*3/uL (ref 4.0–10.5)
nRBC: 0.4 % — ABNORMAL HIGH (ref 0.0–0.2)

## 2022-10-29 LAB — RENAL FUNCTION PANEL
Albumin: 3.2 g/dL — ABNORMAL LOW (ref 3.5–5.0)
Anion gap: 9 (ref 5–15)
BUN: 34 mg/dL — ABNORMAL HIGH (ref 8–23)
CO2: 21 mmol/L — ABNORMAL LOW (ref 22–32)
Calcium: 9.2 mg/dL (ref 8.9–10.3)
Chloride: 108 mmol/L (ref 98–111)
Creatinine, Ser: 1.19 mg/dL (ref 0.61–1.24)
GFR, Estimated: >60 mL/min (ref >60)
Glucose, Bld: 106 mg/dL — ABNORMAL HIGH (ref 70–99)
Phosphorus: 2.2 mg/dL — ABNORMAL LOW (ref 2.5–4.6)
Potassium: 3.6 mmol/L (ref 3.5–5.1)
Sodium: 138 mmol/L (ref 135–145)

## 2022-10-29 LAB — FERRITIN: Ferritin: 92 ng/mL (ref 24–336)

## 2022-10-29 LAB — IRON AND TIBC
Iron: 47 ug/dL (ref 45–182)
Saturation Ratios: 14 % — ABNORMAL LOW (ref 17.9–39.5)
TIBC: 328 ug/dL (ref 250–450)
UIBC: 281 ug/dL

## 2022-10-29 LAB — BPAM RBC
Blood Product Expiration Date: 202409042359
ISSUE DATE / TIME: 202409011043
Unit Type and Rh: 600

## 2022-10-29 LAB — URINE CULTURE: Culture: NO GROWTH

## 2022-10-29 LAB — HEMOGLOBIN AND HEMATOCRIT, BLOOD
HCT: 23.1 % — ABNORMAL LOW (ref 39.0–52.0)
Hemoglobin: 7.8 g/dL — ABNORMAL LOW (ref 13.0–17.0)

## 2022-10-29 LAB — GLUCOSE, CAPILLARY: Glucose-Capillary: 102 mg/dL — ABNORMAL HIGH (ref 70–99)

## 2022-10-29 MED ORDER — SUCRALFATE 1 GM/10ML PO SUSP: 1.0000 g | Freq: Three times a day (TID) | ORAL | 0 refills | Status: DC

## 2022-10-29 MED ORDER — K PHOS MONO-SOD PHOS DI & MONO 155-852-130 MG PO TABS
500.0000 mg | ORAL_TABLET | Freq: Once | ORAL | Status: AC
  Administered 2022-10-29: 500 mg via ORAL
  Filled 2022-10-29: qty 2

## 2022-10-29 MED ORDER — MELATONIN 3 MG PO TABS: 3.0000 mg | ORAL_TABLET | Freq: Once | ORAL | Status: DC

## 2022-10-29 MED ORDER — PANTOPRAZOLE SODIUM 40 MG PO TBEC: 40.0000 mg | DELAYED_RELEASE_TABLET | Freq: Every day | ORAL | 0 refills | Status: DC

## 2022-10-29 MED ORDER — SODIUM CHLORIDE 0.9 % IV SOLN
250.0000 mg | Freq: Once | INTRAVENOUS | Status: AC
  Administered 2022-10-29: 250 mg via INTRAVENOUS
  Filled 2022-10-29: qty 20

## 2022-10-29 MED ORDER — PANTOPRAZOLE SODIUM 40 MG PO TBEC: 40.0000 mg | DELAYED_RELEASE_TABLET | Freq: Two times a day (BID) | ORAL | 0 refills | Status: DC

## 2022-10-29 MED ORDER — MELATONIN 5 MG PO TABS
5.0000 mg | ORAL_TABLET | Freq: Once | ORAL | Status: AC
  Administered 2022-10-29: 5 mg via ORAL
  Filled 2022-10-29: qty 1

## 2022-10-29 NOTE — Evaluation (Signed)
Occupational Therapy Evaluation Patient Details Name: Dwayne Vargas MRN: [id number] DOB: 22-Oct-1945 Today's Date: 10/29/2022   History of Present Illness Pt is 77 year old [facility]  [facility] for weakness, nausea and vomiting. Pt with GI bleed. PMH - bipolar 1 disorder, HTN, DM, ckd, penile CA, and anxiety   Clinical Impression   PTA, pt reports back to being independent since prior hospitalization. Pt reports daughter who is a Teacher, early years/pre does however, provide pillbox set-up for his medication management. Upon eval, pt presents with decreased memory, safety awareness, problem solving, activity tolerance and balance. Pt asking for rest breaks every ~3 minutes and observed to be orthostatic, reporting "tired" feeling rather than dizzy. Pt internally distracted throughout session and with poor delayed memory recall. Suspect pt's memory and cognition are similar to this at baseline. Will follow acutely, but do not suspect need for follow up therapy after discharge.      If plan is discharge home, recommend the following: Assistance with cooking/housework;Direct supervision/assist for medications management;Assist for transportation;Direct supervision/assist for financial management;Help with stairs or ramp for entrance    Functional Status Assessment  Patient has had a recent decline in their functional status and demonstrates the ability to make significant improvements in function in a reasonable and predictable amount of time.  Equipment Recommendations  None recommended by OT    Recommendations for Other Services PT consult     Precautions / Restrictions Precautions Precautions: Fall Precaution Comments: orthostatic      Mobility Bed Mobility Overal bed mobility: Modified Independent                  Transfers Overall transfer level: Modified independent Equipment used: None               General transfer comment: for basic STS      Balance Overall  balance assessment: Mild deficits observed, not formally tested, History of Falls                                         ADL either performed or assessed with clinical judgement   ADL Overall ADL's : Needs assistance/impaired Eating/Feeding: Modified independent   Grooming: Supervision/safety;Standing Grooming Details (indicate cue type and reason): from a distance due to dizziness with prolonged standing at sink Upper Body Bathing: Set up;Sitting   Lower Body Bathing: Supervison/ safety;Sit to/from stand   Upper Body Dressing : Set up;Sitting   Lower Body Dressing: Supervision/safety;Sit to/from stand   Toilet Transfer: Ambulation;Contact guard Engineer, manufacturing Details (indicate cue type and reason): for safety only due to dizziness         Functional mobility during ADLs: Contact guard assist       Vision Baseline Vision/History: 1 Wears glasses Ability to See in Adequate Light: 0 Adequate Patient Visual Report: No change from baseline Vision Assessment?: No apparent visual deficits     Perception Perception: Not tested       Praxis Praxis: Not tested       Pertinent Vitals/Pain Pain Assessment Pain Assessment: No/denies pain     Extremity/Trunk Assessment Upper Extremity Assessment Upper Extremity Assessment: Overall WFL for tasks assessed   Lower Extremity Assessment Lower Extremity Assessment: Defer to PT evaluation   Cervical / Trunk Assessment Cervical / Trunk Assessment:  (multiple prior spinal surgeries per pt)   Communication Communication Communication: No apparent difficulties Cueing Techniques: Verbal cues;Tactile  cues   Cognition Arousal: Alert Behavior During Therapy: WFL for tasks assessed/performed Overall Cognitive Status: No family/caregiver present to determine baseline cognitive functioning                                 General Comments: Unsure pr most recent baseline, but pt internally  distracted by his own thoughts, poor delayed memory recall, but unsure if pt giving best effort either. fair awareness of current abilities, but decr safety overall, wanting to stand rather than sit when becoming dizzy.     General Comments  BP 152/66 (89) HR 82 seated; 130/78 (90) HR 78 standing EOB; 110/72 (80) HR 93 standing 3 minutes; 147/72 (95) HR 74 with return supine.    Exercises     Shoulder Instructions      Home Living Family/patient expects to be discharged to:: Private residence Living Arrangements: Alone Available Help at Discharge: Family Type of Home: Apartment Home Access: Level entry     Home Layout: Able to live on main level with bedroom/bathroom     Bathroom Shower/Tub: Chief Strategy Officer: Standard     Home Equipment: Agricultural consultant (2 wheels);BSC/3in1;Tub bench          Prior Functioning/Environment Prior Level of Function : Independent/Modified Independent;Driving;History of Falls (last six months)             Mobility Comments: No AD ADLs Comments: "totally independent", driving, grocery shopping. Pt daughter however, providing set-up A for medication management with pill box        OT Problem List: Decreased strength;Decreased activity tolerance;Impaired balance (sitting and/or standing);Decreased cognition;Decreased safety awareness;Decreased knowledge of use of DME or AE      OT Treatment/Interventions: Self-care/ADL training;Therapeutic exercise;DME and/or AE instruction;Therapeutic activities;Patient/family education;Balance training;Cognitive remediation/compensation    OT Goals(Current goals can be found in the care plan section) Acute Rehab OT Goals Patient Stated Goal: go home OT Goal Formulation: With patient Time For Goal Achievement: 11/12/22 Potential to Achieve Goals: Good  OT Frequency: Min 1X/week    Co-evaluation              AM-PAC OT "6 Clicks" Daily Activity     Outcome Measure Help from  another person eating meals?: None Help from another person taking care of personal grooming?: A Little Help from another person toileting, which includes using toliet, bedpan, or urinal?: A Little Help from another person bathing (including washing, rinsing, drying)?: A Little Help from another person to put on and taking off regular upper body clothing?: A Little Help from another person to put on and taking off regular lower body clothing?: A Little 6 Click Score: 19   End of Session Equipment Utilized During Treatment: Gait belt Nurse Communication: Mobility status  Activity Tolerance: Patient tolerated treatment well Patient left: in bed;with call bell/phone within reach;with bed alarm set  OT Visit Diagnosis: Unsteadiness on feet (R26.81);Muscle weakness (generalized) (M62.81);Other symptoms and signs involving cognitive function;History of falling (Z91.81);Repeated falls (R29.6)                Time: 0102-7253 OT Time Calculation (min): 27 min Charges:  OT General Charges $OT Visit: 1 Visit OT Evaluation $OT Eval Low Complexity: 1 Low OT Treatments $Self Care/Home Management : 8-22 mins  Tyler Deis, OTR/L [facility] Acute Rehabilitation Office: [phone]   Myrla Halsted 10/29/2022, 11:53 AM

## 2022-10-29 NOTE — Plan of Care (Signed)
  Problem: Safety: Goal: Ability to remain free from injury will improve Outcome: Not Progressing   Problem: Clinical Measurements: Goal: Diagnostic test results will improve Outcome: Not Progressing

## 2022-10-29 NOTE — Discharge Instructions (Addendum)
You were hospitalized for blood loss due to bleeding stomach ulcers. Thank you for allowing [place] to be part of your care.   Please follow up with your GI doctor and your primary care doctor.   Please note these changes made to your medications:   Please START taking:  Protonix 40mg  two times a day for 26 more days. Then you may take 40mg  once a day.  sucralfate (CARAFATE) 3 times daily with meals    Please call our clinic if you have any questions or concerns, we may be able to help and keep you from a long and expensive emergency room wait. Our clinic and after hours phone number is [phone], the best time to call is Monday through Friday 9 am to 4 pm but there is always someone available 24/7 if you have an emergency. If you need medication refills please notify your pharmacy one week in advance and they will send [place] a request.

## 2022-10-29 NOTE — Progress Notes (Signed)
HD#0 Subjective:   Summary: Dwayne Vargas is a 77 y.o. male with pertinent PMH of chronic diarrhea, type 2 diabetes, bipolar I, hypertension and hyperlipidemia, who presented with nausea, vomiting, diarrhea, and weakness and is admitted for acute blood loss anemia 2/2 upper GI bleed.  Patient is doing well this morning without any recurrent bowel movements or weakness.  He is hungry and wants to have a regular diet.  He also wants to go home.  Objective:  Vital signs in last 24 hours: Vitals:   10/28/22 1450 10/28/22 2008 10/29/22 0550 10/29/22 0733  BP: 124/89 127/82 138/68 (!) 119/49  Pulse: 82 75 70 69  Resp:  18 18 17   Temp: 98.1 F (36.7 C) 98.1 F (36.7 C) 98.2 F (36.8 C) 98.1 F (36.7 C)  TempSrc:  Oral Oral   SpO2: 100% 100% 100% 100%  Weight:      Height:       Supplemental O2: Room Air SpO2: 100 % O2 Flow Rate (L/min): 2 L/min   Physical Exam:  Constitutional: Elderly male lying in bed, in no acute distress Cardiovascular: regular rate and rhythm Pulmonary/Chest: normal work of breathing on room air Abdominal: soft, non-tender, non-distended, positive bowel sounds MSK: normal bulk and tone Neurological: alert & oriented x 3, No focal deficits noted Skin: warm and dry  Filed Weights   10/28/22 1131 10/28/22 1324  Weight: 86.2 kg 86.2 kg      Intake/Output Summary (Last 24 hours) at 10/29/2022 1325 Last data filed at 10/29/2022 0900 Gross per 24 hour  Intake 1219.25 ml  Output 0 ml  Net 1219.25 ml   Net IO Since Admission: 1,319.25 mL [10/29/22 1325]  Pertinent Labs:    Latest Ref Rng & Units 10/29/2022    5:40 AM 10/28/2022    4:46 PM 10/28/2022    8:00 AM  CBC  WBC 4.0 - 10.5 K/uL 8.5     Hemoglobin 13.0 - 17.0 g/dL 7.4  7.8  7.1   Hematocrit 39.0 - 52.0 % 21.8  23.2  21.0   Platelets 150 - 400 K/uL 138          Latest Ref Rng & Units 10/29/2022    5:40 AM 10/28/2022    8:00 AM 10/28/2022    7:26 AM  CMP  Glucose 70 - 99 mg/dL 191  478  295    BUN 8 - 23 mg/dL 34  54  54   Creatinine 0.61 - 1.24 mg/dL 6.21  3.08  6.57   Sodium 135 - 145 mmol/L 138  140  140   Potassium 3.5 - 5.1 mmol/L 3.6  3.9  3.8   Chloride 98 - 111 mmol/L 108  110  108   CO2 22 - 32 mmol/L 21   20   Calcium 8.9 - 10.3 mg/dL 9.2   9.4   Total Protein 6.5 - 8.1 g/dL   5.7   Total Bilirubin 0.3 - 1.2 mg/dL   0.9   Alkaline Phos 38 - 126 U/L   41   AST 15 - 41 U/L   21   ALT 0 - 44 U/L   20     Assessment/Plan:   Principal Problem:   GI bleed Active Problems:   Gastric ulcer without hemorrhage or perforation   Gastritis and gastroduodenitis   ABLA (acute blood loss anemia)   Hiatal hernia   Symptomatic anemia   Melena   Iron deficiency anemia   Patient Summary: Dwayne Vargas  is a 77 y.o. male with pertinent PMH of chronic diarrhea, type 2 diabetes, bipolar I, hypertension and hyperlipidemia, who presented with nausea, vomiting, diarrhea, and weakness and is admitted for acute blood loss anemia 2/2 upper GI bleed, on hospital day 0.   Acute blood loss anemia secondary to upper GI bleed Gastric ulcers EGD yesterday showed nonbleeding ulcers in the stomach and duodenum.  Hemoglobin today stable at 7.4 after 1 unit PRBCs yesterday.  He has not had any more signs or symptoms of bleeding.  He would really like to go home.  He has an outpatient GI doctor, digestive health, who he will follow-up with to do a repeat EGD in 8 weeks. - Repeat H&H this afternoon and if stable we can discharge - Continue pantoprazole 40 mg twice daily for 4 weeks then daily after this - Continue Carafate - Transfuse for hemoglobin less than 7 or signs or symptoms of recurrent GI bleed  Diet: Normal IVF: None VTE: None Code: Full  Dispo: Anticipated discharge to Home today or tomorrow pending stable hemoglobin and no recurrent bleeding.   Rocky Morel, DO Internal Medicine Resident PGY-2 Pager: [phone]  Please contact the on call pager after 5 pm and on weekends  at [phone].

## 2022-10-29 NOTE — Care Management Obs Status (Signed)
MEDICARE OBSERVATION STATUS NOTIFICATION   Patient Details  Name: Dwayne Vargas MRN: [id number] Date of Birth: 05-19-1945   Medicare Observation Status Notification Given:  Yes    Tom-Johnson, Hershal Coria, RN 10/29/2022, 3:18 PM

## 2022-10-29 NOTE — Evaluation (Signed)
Physical Therapy Evaluation Patient Details Name: Dwayne Vargas MRN: [id number] DOB: 07/14/1945 Today's Date: 10/29/2022  History of Present Illness  Pt is 77 year old presented to [facility] on  10/28/22 for weakness, nausea and vomiting. Pt with GI bleed. PMH - bipolar 1 disorder, HTN, DM, ckd, penile CA, and anxiety  Clinical Impression  Pt presents to PT with slightly unsteady gait due to illness and inactivity. Expect pt will make good progress back to baseline with mobility. Will follow acutely but will not need PT after DC. No dizziness or lightheadedness during session  Orthostatic BPs  Sitting 149/56  Standing 1275/55  Standing after 3 min 143/63            If plan is discharge home, recommend the following:     Can travel by private vehicle        Equipment Recommendations None recommended by PT  Recommendations for Other Services       Functional Status Assessment Patient has had a recent decline in their functional status and demonstrates the ability to make significant improvements in function in a reasonable and predictable amount of time.     Precautions / Restrictions Precautions Precautions: Fall Precaution Comments: orthostatic, enteric Restrictions Weight Bearing Restrictions: No      Mobility  Bed Mobility Overal bed mobility: Modified Independent                  Transfers Overall transfer level: Modified independent Equipment used: None                    Ambulation/Gait Ambulation/Gait assistance: Supervision Gait Distance (Feet): 125 Feet Assistive device: None Gait Pattern/deviations: Step-through pattern, Decreased stride length Gait velocity: decr Gait velocity interpretation: 1.31 - 2.62 ft/sec, indicative of limited community ambulator   General Gait Details: Supervision for safety. Slightly unsteady but no loss of balance  Stairs            Wheelchair Mobility     Tilt Bed    Modified Rankin (Stroke Patients  Only)       Balance Overall balance assessment: Mild deficits observed, not formally tested, History of Falls                                           Pertinent Vitals/Pain      Home Living Family/patient expects to be discharged to:: Private residence Living Arrangements: Alone Available Help at Discharge: Family Type of Home: Apartment Home Access: Level entry       Home Layout: Able to live on main level with bedroom/bathroom Home Equipment: Rolling Walker (2 wheels);BSC/3in1;Tub bench      Prior Function Prior Level of Function : Independent/Modified Independent;Driving;History of Falls (last six months)             Mobility Comments: No AD ADLs Comments: "totally independent", driving, grocery shopping. Pt daughter however, providing set-up A for medication management with pill box     Extremity/Trunk Assessment   Upper Extremity Assessment Upper Extremity Assessment: Defer to OT evaluation    Lower Extremity Assessment Lower Extremity Assessment: Generalized weakness    Cervical / Trunk Assessment Cervical / Trunk Assessment:  (multiple prior spinal surgeries per pt)  Communication   Communication Communication: No apparent difficulties Cueing Techniques: Verbal cues;Tactile cues  Cognition Arousal: Alert Behavior During Therapy: WFL for tasks assessed/performed Overall Cognitive Status: No family/caregiver  present to determine baseline cognitive functioning                                 General Comments: Easily distracted and decr safety awareness. Likely baseline        General Comments General comments (skin integrity, edema, etc.): See assessment for orthostatics    Exercises     Assessment/Plan    PT Assessment Patient needs continued PT services  PT Problem List Decreased strength;Decreased balance;Decreased mobility;Decreased safety awareness       PT Treatment Interventions DME instruction;Gait  training;Functional mobility training;Therapeutic activities;Therapeutic exercise;Balance training;Patient/family education    PT Goals (Current goals can be found in the Care Plan section)  Acute Rehab PT Goals Patient Stated Goal: go home today PT Goal Formulation: With patient Time For Goal Achievement: 11/05/22 Potential to Achieve Goals: Good    Frequency Min 1X/week     Co-evaluation               AM-PAC PT "6 Clicks" Mobility  Outcome Measure Help needed turning from your back to your side while in a flat bed without using bedrails?: None Help needed moving from lying on your back to sitting on the side of a flat bed without using bedrails?: None Help needed moving to and from a bed to a chair (including a wheelchair)?: None Help needed standing up from a chair using your arms (e.g., wheelchair or bedside chair)?: None Help needed to walk in hospital room?: A Little Help needed climbing 3-5 steps with a railing? : A Little 6 Click Score: 22    End of Session   Activity Tolerance: Patient tolerated treatment well Patient left: in bed;with call bell/phone within reach Nurse Communication: Mobility status PT Visit Diagnosis: Unsteadiness on feet (R26.81);Muscle weakness (generalized) (M62.81)    Time: 1610-9604 PT Time Calculation (min) (ACUTE ONLY): 20 min   Charges:   PT Evaluation $PT Eval Moderate Complexity: 1 Mod   PT General Charges $$ ACUTE PT VISIT: 1 Visit         [facility] PT Acute Rehabilitation Services Office [phone]   Angelina Ok [facility] 10/29/2022, 2:40 PM

## 2022-10-29 NOTE — TOC Transition Note (Signed)
Transition of Care [facility]) - CM/SW Discharge Note   Patient Details  Name: Dwayne Vargas MRN: [id number] Date of Birth: [birth year]  Transition of Care [facility]) CM/SW Contact:  Tom-Johnson, Hershal Coria, RN Phone Number: 10/29/2022, 3:14 PM   Clinical Narrative:     Patient is scheduled for discharge today.  Hospital f/u and discharge instructions on AVS. No TOC needs or recommendations , no PT/OT f/u noted. Daughter, Lynden Ang to transport at discharge.  No further TOC needs noted.               Patient Goals and CMS Choice      Discharge Placement                         Discharge Plan and Services Additional resources added to the After Visit Summary for                                       Social Determinants of Health (SDOH) Interventions SDOH Screenings   Food Insecurity: No Food Insecurity (10/28/2022)  Housing: Low Risk  (10/28/2022)  Transportation Needs: No Transportation Needs (10/28/2022)  Utilities: Not At Risk (10/28/2022)  Tobacco Use: Medium Risk (10/28/2022)     Readmission Risk Interventions     No data to display

## 2022-10-29 NOTE — Progress Notes (Signed)
pAGED Internal med patient wants diet advanced

## 2022-10-29 NOTE — Progress Notes (Signed)
DISCHARGE NOTE HOME Dwayne Vargas to be discharged Home per MD order. Discussed prescriptions and follow up appointments with the patient. Prescriptions given to patient; medication list explained in detail. Patient verbalized understanding.  Skin clean, dry and intact without evidence of skin break down, no evidence of skin tears noted. IV catheter discontinued intact. Site without signs and symptoms of complications. Dressing and pressure applied. Pt denies pain at the site currently. No complaints noted.  Patient free of lines, drains, and wounds.   An After Visit Summary (AVS) was printed and given to the patient. Patient escorted via wheelchair, and discharged home via private auto.  Velia Meyer, RN

## 2022-10-29 NOTE — Plan of Care (Signed)
Plan for d/c today, all medications upcoming appointments will be discussed with patient.  New medications will be sent to [facility]. Awaiting MD for discharge order and Iron infusion to complete.   Problem: Education: Goal: Knowledge of General Education information will improve Description: Including pain rating scale, medication(s)/side effects and non-pharmacologic comfort measures Outcome: Adequate for Discharge   Problem: Health Behavior/Discharge Planning: Goal: Ability to manage health-related needs will improve Outcome: Adequate for Discharge   Problem: Clinical Measurements: Goal: Ability to maintain clinical measurements within normal limits will improve Outcome: Adequate for Discharge Goal: Will remain free from infection Outcome: Adequate for Discharge Goal: Diagnostic test results will improve Outcome: Adequate for Discharge Goal: Respiratory complications will improve Outcome: Adequate for Discharge Goal: Cardiovascular complication will be avoided Outcome: Adequate for Discharge   Problem: Activity: Goal: Risk for activity intolerance will decrease Outcome: Adequate for Discharge   Problem: Nutrition: Goal: Adequate nutrition will be maintained Outcome: Adequate for Discharge   Problem: Coping: Goal: Level of anxiety will decrease Outcome: Adequate for Discharge   Problem: Elimination: Goal: Will not experience complications related to bowel motility Outcome: Adequate for Discharge Goal: Will not experience complications related to urinary retention Outcome: Adequate for Discharge   Problem: Pain Managment: Goal: General experience of comfort will improve Outcome: Adequate for Discharge   Problem: Safety: Goal: Ability to remain free from injury will improve Outcome: Adequate for Discharge   Problem: Skin Integrity: Goal: Risk for impaired skin integrity will decrease Outcome: Adequate for Discharge   Problem: Acute Rehab OT Goals (only OT should  resolve) Goal: Pt. Will Perform Grooming Outcome: Adequate for Discharge Goal: Pt. Will Perform Lower Body Dressing Outcome: Adequate for Discharge Goal: Pt. Will Transfer To Toilet Outcome: Adequate for Discharge Goal: OT Additional ADL Goal #1 Outcome: Adequate for Discharge   Problem: Acute Rehab PT Goals(only PT should resolve) Goal: Pt Will Ambulate Outcome: Adequate for Discharge

## 2022-10-29 NOTE — Discharge Summary (Addendum)
Name: Dwayne Vargas MRN: [id number] DOB: [birth year] 77 y.o. PCP: Amalia Greenhouse, DO  Date of Admission: 10/28/2022  6:52 AM Date of Discharge:  10/29/2022 Attending Physician: Dr. Heide Spark  DISCHARGE DIAGNOSIS:  Primary Problem: GI bleed   Hospital Problems: Principal Problem:   GI bleed Active Problems:   Gastric ulcer without hemorrhage or perforation   Gastritis and gastroduodenitis   ABLA (acute blood loss anemia)   Hiatal hernia   Symptomatic anemia   Melena   Iron deficiency anemia    DISCHARGE MEDICATIONS:   Allergies as of 10/29/2022       Reactions   Aspirin Other (See Comments)   GI upset   Contrast Media [iodinated Contrast Media] Hives, Swelling   Fentanyl    Pt wishes not to have   Morphine And Codeine Other (See Comments)   Tremors    Oxycontin [oxycodone Hcl] Other (See Comments)   "pt does not wish to have"   Shellfish Allergy Hives   All fish and shellfish w/ iodine   Diazepam Other (See Comments)   Patient does not want to use        Medication List     STOP taking these medications    cyanocobalamin 1000 MCG tablet   ferrous sulfate 325 (65 FE) MG tablet   Jardiance 25 MG Tabs tablet Generic drug: empagliflozin   lamoTRIgine 200 MG tablet Commonly known as: LAMICTAL       TAKE these medications    metFORMIN 500 MG tablet Commonly known as: GLUCOPHAGE Take 500 mg by mouth 2 (two) times daily.   pantoprazole 40 MG tablet Commonly known as: Protonix Take 1 tablet (40 mg total) by mouth 2 (two) times daily for 26 days.   pantoprazole 40 MG tablet Commonly known as: Protonix Take 1 tablet (40 mg total) by mouth daily. Start taking on: November 24, 2022   Probiotic 1-250 BILLION-MG Caps Take 1 tablet by mouth daily.   rosuvastatin 10 MG tablet Commonly known as: CRESTOR Take 1 tablet (10 mg total) by mouth daily.   sucralfate 1 GM/10ML suspension Commonly known as: CARAFATE Take 10 mLs (1 g total) by mouth 4 (four) times  daily -  with meals and at bedtime.        DISPOSITION AND FOLLOW-UP:  [name] was discharged from [facility] in Good condition. At the hospital follow up visit please address:  Follow-up Recommendations: Consults:  Labs: CBC.  Studies:  Follow up GI path panel, Iron studies, surgical pathology specimen Repeat EGD per GI in 8 weeks Medications:  Protonix 40mg  PO BID x 26 more days. Then 40mg  Protonix daily.   Continue Carafate for 4 weeks and then stop  Follow-up Appointments: Please reach out to your GI doctor to set up an appointment   HOSPITAL COURSE:  Patient Summary: Dwayne Vargas is a 77 y.o. person living with a history of chronic diarrhea, type 2 diabetes, bipolar I, hypertension and hyperlipidemia, who presented with nausea, vomiting, diarrhea, and weakness and is admitted for acute blood loss anemia 2/2 upper GI bleed.  Patient presented with acute onset diarrhea that was dark as accompanied by nausea, vomiting, diffuse weakness, and confusion.  On arrival to the ED he was hemodynamically stable but drowsy and confused.  Hemoglobin was 6.9 with a previous baseline around 10.  He had a leukocytosis of 14 but was afebrile.  GI consulted in the ED and patient taken for EGD which showed multiple gastric and duodenal ulcers  without significant stigmata of bleeding.  Patient was continued on high-dose PPI, Carafate, and given IV iron.  He is very eager to leave the hospital and did not show any signs of bleeding overnight or throughout the next day and hemoglobin remained stable on recheck the afternoon of discharge.  Remained on pantoprazole 40 mg twice daily for 4 weeks and then daily after this.  He will follow-up with repeat EGD in about 8 weeks with his regular GI doctor.  He also follow-up closely with his primary care provider.   DISCHARGE INSTRUCTIONS:   You were hospitalized for blood loss due to bleeding stomach ulcers. Thank you for allowing [place] to be  part of your care.   Please follow up with your GI doctor and your primary care doctor.   Please note these changes made to your medications:   Please START taking:  Protonix 40mg  two times a day for 26 more days. Then you may take 40mg  once a day.  sucralfate (CARAFATE) 3 times daily with meals    Please call our clinic if you have any questions or concerns, we may be able to help and keep you from a long and expensive emergency room wait. Our clinic and after hours phone number is [phone], the best time to call is Monday through Friday 9 am to 4 pm but there is always someone available 24/7 if you have an emergency. If you need medication refills please notify your pharmacy one week in advance and they will send [place] a request.    SUBJECTIVE:  Patient doing well this AM. Denies any emesis or melena. No weakness or lightheaded. Able to ambulate. No complaints. Hungry and eating well.   Discharge Vitals:   BP (!) 119/49 (BP Location: Left Arm)   Pulse 69   Temp 98.1 F (36.7 C)   Resp 17   Ht 5\' 11"  (1.803 m)   Wt 86.2 kg   SpO2 100%   BMI 26.50 kg/m   OBJECTIVE:  Physical Exam Constitutional:      General: He is not in acute distress.    Appearance: Normal appearance. He is not toxic-appearing.  HENT:     Head: Normocephalic and atraumatic.  Cardiovascular:     Rate and Rhythm: Normal rate and regular rhythm.  Pulmonary:     Effort: Pulmonary effort is normal.     Breath sounds: Normal breath sounds.  Abdominal:     General: Abdomen is flat. There is no distension.     Palpations: Abdomen is soft.     Tenderness: There is no abdominal tenderness.  Skin:    General: Skin is warm and dry.  Neurological:     Mental Status: He is alert. Mental status is at baseline.      Pertinent Labs, Studies, and Procedures:     Latest Ref Rng & Units 10/29/2022    5:40 AM 10/28/2022    4:46 PM 10/28/2022    8:00 AM  CBC  WBC 4.0 - 10.5 K/uL 8.5     Hemoglobin 13.0 - 17.0 g/dL  7.4  7.8  7.1   Hematocrit 39.0 - 52.0 % 21.8  23.2  21.0   Platelets 150 - 400 K/uL 138          Latest Ref Rng & Units 10/29/2022    5:40 AM 10/28/2022    8:00 AM 10/28/2022    7:26 AM  CMP  Glucose 70 - 99 mg/dL 865  784  696  BUN 8 - 23 mg/dL 34  54  54   Creatinine 0.61 - 1.24 mg/dL 1.61  0.96  0.45   Sodium 135 - 145 mmol/L 138  140  140   Potassium 3.5 - 5.1 mmol/L 3.6  3.9  3.8   Chloride 98 - 111 mmol/L 108  110  108   CO2 22 - 32 mmol/L 21   20   Calcium 8.9 - 10.3 mg/dL 9.2   9.4   Total Protein 6.5 - 8.1 g/dL   5.7   Total Bilirubin 0.3 - 1.2 mg/dL   0.9   Alkaline Phos 38 - 126 U/L   41   AST 15 - 41 U/L   21   ALT 0 - 44 U/L   20     DG Chest 2 View  Result Date: 10/28/2022 CLINICAL DATA:  Cough. EXAM: CHEST - 2 VIEW COMPARISON:  11/20/2021 FINDINGS: The lungs are clear without focal pneumonia, edema, pneumothorax or pleural effusion. The cardiopericardial silhouette is within normal limits for size. No acute bony abnormality. Telemetry leads overlie the chest. IMPRESSION: No active cardiopulmonary disease. Electronically Signed   By: Kennith Center M.D.   On: 10/28/2022 09:00   CT Head Wo Contrast  Result Date: 10/28/2022 CLINICAL DATA:  77 year old male with altered mental status.  Cough. EXAM: CT HEAD WITHOUT CONTRAST TECHNIQUE: Contiguous axial images were obtained from the base of the skull through the vertex without intravenous contrast. RADIATION DOSE REDUCTION: This exam was performed according to the departmental dose-optimization program which includes automated exposure control, adjustment of the mA and/or kV according to patient size and/or use of iterative reconstruction technique. COMPARISON:  Brain MRI 07/20/2022.  Head CT 03/11/2022. FINDINGS: Brain: Stable cerebral volume. No midline shift, ventriculomegaly, mass effect, evidence of mass lesion, intracranial hemorrhage or evidence of cortically based acute infarction. Stable patchy bilateral cerebral white  matter hypodensity, moderate for age. Vascular: No suspicious intracranial vascular hyperdensity. Calcified atherosclerosis at the skull base. Skull: No acute osseous abnormality identified. Sinuses/Orbits: Visualized paranasal sinuses and mastoids are clear. Other: No acute orbit or scalp soft tissue finding. IMPRESSION: 1. No acute intracranial abnormality. 2. Chronic cerebral white matter disease. Electronically Signed   By: Odessa Fleming M.D.   On: 10/28/2022 08:16     Signed: Lovie Macadamia MD Internal Medicine Resident, PGY-1 [facility]  Pager: [phone] 1:33 PM, 10/29/2022

## 2022-10-30 ENCOUNTER — Encounter (HOSPITAL_COMMUNITY): Payer: Self-pay | Admitting: Gastroenterology

## 2022-11-01 ENCOUNTER — Emergency Department (HOSPITAL_COMMUNITY): Payer: Medicare PPO

## 2022-11-01 ENCOUNTER — Emergency Department (HOSPITAL_COMMUNITY)
Admission: EM | Admit: 2022-11-01 | Discharge: 2022-11-01 | Disposition: A | Discharge status: Home / Self Care | Payer: Medicare PPO | Attending: Emergency Medicine | Admitting: Emergency Medicine

## 2022-11-01 DIAGNOSIS — N189 Chronic kidney disease, unspecified: Secondary | ICD-10-CM | POA: Diagnosis not present

## 2022-11-01 DIAGNOSIS — I129 Hypertensive chronic kidney disease with stage 1 through stage 4 chronic kidney disease, or unspecified chronic kidney disease: Secondary | ICD-10-CM | POA: Diagnosis not present

## 2022-11-01 DIAGNOSIS — R030 Elevated blood-pressure reading, without diagnosis of hypertension: Secondary | ICD-10-CM

## 2022-11-01 DIAGNOSIS — R079 Chest pain, unspecified: Secondary | ICD-10-CM | POA: Diagnosis not present

## 2022-11-01 DIAGNOSIS — R1030 Lower abdominal pain, unspecified: Secondary | ICD-10-CM | POA: Diagnosis not present

## 2022-11-01 DIAGNOSIS — N200 Calculus of kidney: Secondary | ICD-10-CM | POA: Diagnosis not present

## 2022-11-01 DIAGNOSIS — R1084 Generalized abdominal pain: Secondary | ICD-10-CM | POA: Insufficient documentation

## 2022-11-01 DIAGNOSIS — E1122 Type 2 diabetes mellitus with diabetic chronic kidney disease: Secondary | ICD-10-CM | POA: Diagnosis not present

## 2022-11-01 DIAGNOSIS — K5900 Constipation, unspecified: Secondary | ICD-10-CM | POA: Diagnosis not present

## 2022-11-01 DIAGNOSIS — I10 Essential (primary) hypertension: Secondary | ICD-10-CM | POA: Diagnosis not present

## 2022-11-01 DIAGNOSIS — Z8549 Personal history of malignant neoplasm of other male genital organs: Secondary | ICD-10-CM | POA: Insufficient documentation

## 2022-11-01 DIAGNOSIS — Z87891 Personal history of nicotine dependence: Secondary | ICD-10-CM | POA: Diagnosis not present

## 2022-11-01 DIAGNOSIS — R918 Other nonspecific abnormal finding of lung field: Secondary | ICD-10-CM | POA: Diagnosis not present

## 2022-11-01 LAB — URINALYSIS, ROUTINE W REFLEX MICROSCOPIC
Bacteria, UA: NONE SEEN
Bilirubin Urine: NEGATIVE
Glucose, UA: NEGATIVE mg/dL
Hgb urine dipstick: NEGATIVE
Ketones, ur: NEGATIVE mg/dL
Leukocytes,Ua: NEGATIVE
Nitrite: NEGATIVE
Protein, ur: 30 mg/dL — AB
Specific Gravity, Urine: 1.01 (ref 1.005–1.030)
pH: 6 (ref 5.0–8.0)

## 2022-11-01 LAB — CBC
HCT: 23.8 % — ABNORMAL LOW (ref 39.0–52.0)
Hemoglobin: 7.8 g/dL — ABNORMAL LOW (ref 13.0–17.0)
MCH: 29.7 pg (ref 26.0–34.0)
MCHC: 32.8 g/dL (ref 30.0–36.0)
MCV: 90.5 fL (ref 80.0–100.0)
Platelets: 250 10*3/uL (ref 150–400)
RBC: 2.63 MIL/uL — ABNORMAL LOW (ref 4.22–5.81)
RDW: 17.2 % — ABNORMAL HIGH (ref 11.5–15.5)
WBC: 8.7 10*3/uL (ref 4.0–10.5)
nRBC: 0.3 % — ABNORMAL HIGH (ref 0.0–0.2)

## 2022-11-01 LAB — COMPREHENSIVE METABOLIC PANEL
ALT: 19 U/L (ref 0–44)
AST: 26 U/L (ref 15–41)
Albumin: 3.6 g/dL (ref 3.5–5.0)
Alkaline Phosphatase: 46 U/L (ref 38–126)
Anion gap: 7 (ref 5–15)
BUN: 13 mg/dL (ref 8–23)
CO2: 23 mmol/L (ref 22–32)
Calcium: 9.3 mg/dL (ref 8.9–10.3)
Chloride: 106 mmol/L (ref 98–111)
Creatinine, Ser: 1.1 mg/dL (ref 0.61–1.24)
GFR, Estimated: >60 mL/min (ref >60)
Glucose, Bld: 118 mg/dL — ABNORMAL HIGH (ref 70–99)
Potassium: 3.7 mmol/L (ref 3.5–5.1)
Sodium: 136 mmol/L (ref 135–145)
Total Bilirubin: 0.6 mg/dL (ref 0.3–1.2)
Total Protein: 6 g/dL — ABNORMAL LOW (ref 6.5–8.1)

## 2022-11-01 LAB — LACTIC ACID, PLASMA: Lactic Acid, Venous: 1.7 mmol/L (ref 0.5–1.9)

## 2022-11-01 LAB — LIPASE, BLOOD: Lipase: 42 U/L (ref 11–51)

## 2022-11-01 LAB — TROPONIN I (HIGH SENSITIVITY)
Troponin I (High Sensitivity): 10 ng/L (ref <18)
Troponin I (High Sensitivity): 9 ng/L (ref <18)

## 2022-11-01 LAB — SURGICAL PATHOLOGY

## 2022-11-01 MED ORDER — HYDRALAZINE HCL 20 MG/ML IJ SOLN
10.0000 mg | Freq: Once | INTRAMUSCULAR | Status: AC
  Administered 2022-11-01: 10 mg via INTRAVENOUS
  Filled 2022-11-01: qty 1

## 2022-11-01 MED ORDER — POLYETHYLENE GLYCOL 3350 17 G PO PACK: 17.0000 g | PACK | Freq: Every day | ORAL | 0 refills | Status: AC

## 2022-11-01 NOTE — ED Notes (Addendum)
IV attempt unsuccessful. Pt states he will not allow a second attempt until he speaks to MD. MD Tegeler notified.

## 2022-11-01 NOTE — ED Provider Notes (Signed)
[place] EMERGENCY DEPARTMENT AT [facility] Provider Note   CSN: [id number] Arrival date & time: 11/01/22  4098     History  Chief Complaint  Patient presents with   Constipation   Abdominal Pain   Hypertension    Dwayne Vargas is a 77 y.o. male.   Constipation Severity:  Severe Time since last bowel movement:  6 days Timing:  Constant Progression:  Unchanged Chronicity:  New Stool description:  None produced Relieved by:  Nothing Worsened by:  Nothing Ineffective treatments:  None tried Associated symptoms: abdominal pain   Associated symptoms: no back pain, no diarrhea, no dysuria, no fever, no nausea and no vomiting   Abdominal Pain Associated symptoms: chest pain (yesterday but resolved) and constipation   Associated symptoms: no chills, no cough, no diarrhea, no dysuria, no fatigue, no fever, no nausea, no shortness of breath and no vomiting   Hypertension Associated symptoms include chest pain (yesterday but resolved) and abdominal pain. Pertinent negatives include no headaches and no shortness of breath.       Home Medications Prior to Admission medications   Medication Sig Start Date End Date Taking? Authorizing Provider  Bacillus Coagulans-Inulin (PROBIOTIC) 1-250 BILLION-MG CAPS Take 1 tablet by mouth daily.    [provider]  metFORMIN (GLUCOPHAGE) 500 MG tablet Take 500 mg by mouth 2 (two) times daily. 03/07/22   [provider]  pantoprazole (PROTONIX) 40 MG tablet Take 1 tablet (40 mg total) by mouth 2 (two) times daily for 26 days. 10/29/22 11/24/22  Lovie Macadamia, MD  pantoprazole (PROTONIX) 40 MG tablet Take 1 tablet (40 mg total) by mouth daily. 11/24/22 11/24/23  Lovie Macadamia, MD  rosuvastatin (CRESTOR) 10 MG tablet Take 1 tablet (10 mg total) by mouth daily. 01/05/22   Carlos Levering, NP  sucralfate (CARAFATE) 1 GM/10ML suspension Take 10 mLs (1 g total) by mouth 4 (four) times daily -  with meals and at  bedtime. 10/29/22   Lovie Macadamia, MD      Allergies    Aspirin, Contrast media [iodinated contrast media], Fentanyl, Morphine and codeine, Oxycontin [oxycodone hcl], Shellfish allergy, and Diazepam    Review of Systems   Review of Systems  Constitutional:  Negative for chills, fatigue and fever.  HENT:  Negative for congestion.   Respiratory:  Negative for cough, choking, chest tightness and shortness of breath.   Cardiovascular:  Positive for chest pain (yesterday but resolved). Negative for palpitations and leg swelling.  Gastrointestinal:  Positive for abdominal pain and constipation. Negative for diarrhea, nausea and vomiting.  Genitourinary:  Negative for dysuria.  Musculoskeletal:  Negative for back pain, neck pain and neck stiffness.  Skin:  Negative for rash.  Neurological:  Negative for headaches.  Psychiatric/Behavioral:  Negative for agitation.   All other systems reviewed and are negative.   Physical Exam Updated Vital Signs BP (!) 197/88   Pulse 73   Temp 97.8 F (36.6 C)   Resp 16   SpO2 100%  Physical Exam Vitals and nursing note reviewed.  Constitutional:      General: He is not in acute distress.    Appearance: He is well-developed. He is not ill-appearing, toxic-appearing or diaphoretic.  HENT:     Head: Normocephalic and atraumatic.  Eyes:     Conjunctiva/sclera: Conjunctivae normal.  Cardiovascular:     Rate and Rhythm: Normal rate and regular rhythm.     Heart sounds: No murmur heard. Pulmonary:     Effort: Pulmonary effort  is normal. No respiratory distress.     Breath sounds: Normal breath sounds.  Abdominal:     General: Abdomen is flat. Bowel sounds are normal.     Palpations: Abdomen is soft.     Tenderness: There is abdominal tenderness in the right lower quadrant, periumbilical area, suprapubic area and left lower quadrant. There is no right CVA tenderness, left CVA tenderness, guarding or rebound. Negative signs include Murphy's sign.   Musculoskeletal:        General: No swelling.     Cervical back: Neck supple.  Skin:    General: Skin is warm and dry.     Capillary Refill: Capillary refill takes less than 2 seconds.     Coloration: Skin is not pale.  Neurological:     Mental Status: He is alert.  Psychiatric:        Mood and Affect: Mood normal.     ED Results / Procedures / Treatments   Labs (all labs ordered are listed, but only abnormal results are displayed) Labs Reviewed  COMPREHENSIVE METABOLIC PANEL - Abnormal; Notable for the following components:      Result Value   Glucose, Bld 118 (*)    Total Protein 6.0 (*)    All other components within normal limits  CBC - Abnormal; Notable for the following components:   RBC 2.63 (*)    Hemoglobin 7.8 (*)    HCT 23.8 (*)    RDW 17.2 (*)    nRBC 0.3 (*)    All other components within normal limits  URINALYSIS, ROUTINE W REFLEX MICROSCOPIC - Abnormal; Notable for the following components:   Protein, ur 30 (*)    All other components within normal limits  LIPASE, BLOOD  LACTIC ACID, PLASMA  LACTIC ACID, PLASMA  TROPONIN I (HIGH SENSITIVITY)  TROPONIN I (HIGH SENSITIVITY)    EKG EKG Interpretation Date/Time:  Thursday November 01 2022 10:33:42 EDT Ventricular Rate:  64 PR Interval:  184 QRS Duration:  104 QT Interval:  417 QTC Calculation: 431 R Axis:   10  Text Interpretation: Sinus rhythm Abnormal R-wave progression, early transition Left ventricular hypertrophy when compared to prior, similar appearance. No STEMI Confirmed by Theda Belfast ([id number]) on 11/01/2022 10:44:42 AM  Radiology CT ABDOMEN PELVIS WO CONTRAST  Result Date: 11/01/2022 CLINICAL DATA:  Abdominal pain, acute, nonlocalized. No bowel movement for 6 days. Evaluate for bowel obstruction versus other problem. Elevated blood pressure. EXAM: CT ABDOMEN AND PELVIS WITHOUT CONTRAST TECHNIQUE: Multidetector CT imaging of the abdomen and pelvis was performed following the standard  protocol without IV contrast. RADIATION DOSE REDUCTION: This exam was performed according to the departmental dose-optimization program which includes automated exposure control, adjustment of the mA and/or kV according to patient size and/or use of iterative reconstruction technique. COMPARISON:  Radiographs 03/23/2022.  Abdominopelvic CT 07/04/2016. FINDINGS: Lower chest: Stable mild scarring at both lung bases and calcified right lower lobe granuloma. No significant pleural or pericardial effusion. Aortic and coronary artery atherosclerosis noted. Hepatobiliary: The liver appears unremarkable as imaged in the noncontrast state. Stable mild extrahepatic biliary dilatation status post cholecystectomy, likely physiologic. Pancreas: Unremarkable. No pancreatic ductal dilatation or surrounding inflammatory changes. Spleen: Normal in size without focal abnormality. Adrenals/Urinary Tract: Both adrenal glands appear normal. Tiny nonobstructing calculus in the lower pole of the left kidney. No evidence of ureteral calculus, hydronephrosis or suspicious renal lesion. Small renal cysts are similar to the previous study; no specific follow-up imaging recommended. Stable mild perinephric soft tissue stranding  bilaterally. The bladder appears unremarkable for its degree of distention, although is partly obscured by artifact from the right total hip arthroplasty. Stomach/Bowel: No enteric contrast administered. The stomach appears unremarkable for its degree of distension. No evidence of bowel wall thickening, distention or surrounding inflammatory change. The appendix appears normal. Moderate stool throughout the colon. Vascular/Lymphatic: There are no enlarged abdominal or pelvic lymph nodes. Aortic and branch vessel atherosclerosis without evidence of aneurysm. Reproductive: The prostate gland and seminal vesicles appear grossly stable status post TUR, although are partly obscured by artifact from the hip arthroplasty.  Penile prosthesis and left lower quadrant reservoir are noted. Other: No evidence of abdominal wall mass or hernia. No ascites or pneumoperitoneum. Musculoskeletal: No acute or significant osseous findings. Interval L4-S1 posterior lumbar and interbody fusion and right total hip arthroplasty. Unless specific follow-up recommendations are mentioned in the findings or impression sections, no imaging follow-up of any mentioned incidental findings is recommended. IMPRESSION: 1. No acute findings or explanation for the patient's symptoms. 2. Moderate stool throughout the colon consistent with constipation. 3. Tiny nonobstructing left renal calculus. No evidence of ureteral calculus or hydronephrosis. 4. Interval L4-S1 posterior lumbar and interbody fusion and right total hip arthroplasty. 5.  Aortic Atherosclerosis (ICD10-I70.0). Electronically Signed   By: Carey Bullocks M.D.   On: 11/01/2022 15:01   DG Chest 2 View  Result Date: 11/01/2022 CLINICAL DATA:  Lower abdominal pain.  Chest pain yesterday. EXAM: CHEST - 2 VIEW COMPARISON:  10/28/2022 FINDINGS: Hyperinflation. Midline trachea. Normal heart size. Left costophrenic angle blunting is similar and likely due to trace pleural thickening or fluid. Borderline pulmonary interstitial prominence is likely related to the history of remote smoking and presumed chronic bronchitis. No superimposed lobar consolidation. No free intraperitoneal air. IMPRESSION: No acute cardiopulmonary disease. Similar trace left pleural fluid or thickening. Electronically Signed   By: Jeronimo Greaves M.D.   On: 11/01/2022 11:20    Procedures Procedures    Medications Ordered in ED Medications  hydrALAZINE (APRESOLINE) injection 10 mg (10 mg Intravenous Given 11/01/22 1225)    ED Course/ Medical Decision Making/ A&P                                 Medical Decision Making Amount and/or Complexity of Data Reviewed Labs: ordered. Radiology: ordered.  Risk Prescription drug  management.    Juarez Spaziani is a 77 y.o. male with past medical history significant for hypertension, BPH, previous penile cancer, diabetes, vertigo, CKD, and recent admission for bleed and symptomatic anemia who presents with no bowel movement for 6 days, abdominal pain, intermittent chest pain.  According to patient, he got out of the hospital 2 days ago and since yesterday has been having worsening pain across his abdomen.  Reports pain in the lower abdomen and mid abdomen.  He reports yesterday he had some chest pain that was down 10 severity that has resolved.  Denies shortness of breath or palpitations.  Reports no nausea or vomiting now.  Reports he has not a bowel movement since he came into the hospital 6 days ago but is still passing gas.  He denies any trauma.  Denies any urinary changes no dysuria hematuria or amount change.  He does report he is having stress and thinks that is contributing to his elevated blood pressure.    On exam, patient resting with elevated blood pressures.  His chest was nontender to palpation and I  did not appreciate a murmur.  Abdomen was tender in the central and lower abdomen.  Flanks and back nontender.  He does have good pulses in extremities.  Intact sensation and strength in extremities.  Symmetric smile.  Clear speech.  Good pulses in upper extremities.  Otherwise resting.  Given his report of no bowel movement in 6 days with moderate abdominal pain, I am somewhat concerned about possible bowel obstruction.  Will get CT imaging although we cannot use contrast due to his contrast allergy with hives and swelling.  We did discuss dissection given the chest pain and abdominal pain but it did not go to his back and it was not sharp.  With his contrast allergy will additionally start with chest x-ray for the chest pain and abdomen pelvis CT without contrast.  Will get urinalysis.  Will check blood work.  Will get troponins.  Anticipate reassessment after workup to  determine disposition.  4:05 PM Workup began to return similar to prior.  Hemoglobin is unchanged from discharge several days ago.  He is denying further rectal bleeding.  He had improvement in his symptoms since has been here.  No abdominal pain at this time.  His labs also showed no AKI, normal liver function, and urinalysis did not show UTI.  X-ray did not show pneumonia.  Normal lipase and lactic acid is normal.  Troponin normal x 2.  EKG reassuring.  Given his reassuring workup I do suspect he is constipated.  We discussed starting MiraLAX which she has at home.  Will give another prescription for this.  He will follow-up with his primary doctor.  We gave him some blood pressure medicine earlier but he reports he did not take it this morning.  He will talk to his family and his doctor about blood pressure medicine getting restarted at home.  He agreed with plan of care and follow-up instructions and was discharged in good condition.  He spoke to his daughter this is guarding on the phone who reportedly agrees with plan for discharge home.  Will discharge for outpatient follow-up.        Final Clinical Impression(s) / ED Diagnoses Final diagnoses:  Generalized abdominal pain  Constipation, unspecified constipation type  Elevated blood pressure reading    Rx / DC Orders ED Discharge Orders     None       Clinical Impression: 1. Generalized abdominal pain   2. Constipation, unspecified constipation type   3. Elevated blood pressure reading     Disposition: Discharge  Condition: Good  I have discussed the results, Dx and Tx plan with the pt(& family if present). He/she/they expressed understanding and agree(s) with the plan. Discharge instructions discussed at great length. Strict return precautions discussed and pt &/or family have verbalized understanding of the instructions. No further questions at time of discharge.    New Prescriptions   No medications on file     Follow Up: [facility] Emergency Department at [facility] [address] [place] [address] [phone]    Amalia Greenhouse, DO [address] [place] [address] [phone]         Verne Cove, Canary Brim, MD 11/01/22 1620

## 2022-11-01 NOTE — ED Notes (Signed)
Patient transported to CT 

## 2022-11-01 NOTE — Discharge Instructions (Addendum)
Your history, exam, and workup today revealed unchanged hemoglobin from your discharge several days ago.  You are still anemic but it does not seem to have dropped whatsoever.  Your CT scan did not show obstruction but showed some constipation.  As we discussed, please continue your bowel regimen and use the MiraLAX you have used before.  Please consider restarting your home blood pressure medicine your blood pressure was elevated today.  I know you wanted to talk to your doctor and family about this.  Your other workup was reassuring including a chest x-ray did not show acute pneumonia and your heart enzymes were negative both times we checked them.  We feel you are safe for discharge home given your stability for over 7-1/2 hours here and lack of further abdominal pain.  Please rest and stay hydrated and follow-up with your outpatient teams.  If any symptoms change or worsen acutely, please return to the nearest emergency department.

## 2022-11-01 NOTE — ED Triage Notes (Signed)
Pt stated, I was discharged 2 days ago and I've not able to have a bowel movement, my BP is up and I have this pain in my lower lower abdomen

## 2022-11-01 NOTE — ED Notes (Addendum)
Patient transported to X-ray 

## 2022-11-08 DIAGNOSIS — F319 Bipolar disorder, unspecified: Secondary | ICD-10-CM | POA: Diagnosis not present

## 2022-11-08 DIAGNOSIS — D509 Iron deficiency anemia, unspecified: Secondary | ICD-10-CM | POA: Diagnosis not present

## 2022-11-08 DIAGNOSIS — K269 Duodenal ulcer, unspecified as acute or chronic, without hemorrhage or perforation: Secondary | ICD-10-CM | POA: Diagnosis not present

## 2022-11-08 DIAGNOSIS — K259 Gastric ulcer, unspecified as acute or chronic, without hemorrhage or perforation: Secondary | ICD-10-CM | POA: Diagnosis not present

## 2022-11-08 DIAGNOSIS — E782 Mixed hyperlipidemia: Secondary | ICD-10-CM | POA: Diagnosis not present

## 2022-11-08 DIAGNOSIS — I10 Essential (primary) hypertension: Secondary | ICD-10-CM | POA: Diagnosis not present

## 2022-11-16 ENCOUNTER — Emergency Department (HOSPITAL_COMMUNITY)
Admission: EM | Admit: 2022-11-16 | Discharge: 2022-11-17 | Discharge status: Against Medical Advice | Payer: Medicare PPO | Attending: Emergency Medicine | Admitting: Emergency Medicine

## 2022-11-16 ENCOUNTER — Emergency Department (HOSPITAL_COMMUNITY)
Admission: EM | Admit: 2022-11-16 | Discharge: 2022-11-16 | Discharge status: Against Medical Advice | Payer: Medicare PPO | Source: Home / Self Care

## 2022-11-16 DIAGNOSIS — R109 Unspecified abdominal pain: Secondary | ICD-10-CM | POA: Diagnosis not present

## 2022-11-16 DIAGNOSIS — Z5321 Procedure and treatment not carried out due to patient leaving prior to being seen by health care provider: Secondary | ICD-10-CM | POA: Diagnosis not present

## 2022-11-17 ENCOUNTER — Encounter (HOSPITAL_COMMUNITY): Payer: Self-pay | Admitting: *Deleted

## 2022-11-17 ENCOUNTER — Other Ambulatory Visit: Payer: Self-pay

## 2022-11-17 LAB — CBC
HCT: 29.4 % — ABNORMAL LOW (ref 39.0–52.0)
Hemoglobin: 9.1 g/dL — ABNORMAL LOW (ref 13.0–17.0)
MCH: 28.1 pg (ref 26.0–34.0)
MCHC: 31 g/dL (ref 30.0–36.0)
MCV: 90.7 fL (ref 80.0–100.0)
Platelets: 315 10*3/uL (ref 150–400)
RBC: 3.24 MIL/uL — ABNORMAL LOW (ref 4.22–5.81)
RDW: 15.7 % — ABNORMAL HIGH (ref 11.5–15.5)
WBC: 10.9 10*3/uL — ABNORMAL HIGH (ref 4.0–10.5)
nRBC: 0 % (ref 0.0–0.2)

## 2022-11-17 LAB — URINALYSIS, ROUTINE W REFLEX MICROSCOPIC
Bilirubin Urine: NEGATIVE
Glucose, UA: NEGATIVE mg/dL
Ketones, ur: NEGATIVE mg/dL
Nitrite: NEGATIVE
Protein, ur: 100 mg/dL — AB
RBC / HPF: >50 RBC/hpf (ref 0–5)
Specific Gravity, Urine: 1.01 (ref 1.005–1.030)
WBC, UA: >50 WBC/hpf (ref 0–5)
pH: 6 (ref 5.0–8.0)

## 2022-11-17 LAB — COMPREHENSIVE METABOLIC PANEL
ALT: 22 U/L (ref 0–44)
AST: 27 U/L (ref 15–41)
Albumin: 4 g/dL (ref 3.5–5.0)
Alkaline Phosphatase: 50 U/L (ref 38–126)
Anion gap: 10 (ref 5–15)
BUN: 19 mg/dL (ref 8–23)
CO2: 21 mmol/L — ABNORMAL LOW (ref 22–32)
Calcium: 9.6 mg/dL (ref 8.9–10.3)
Chloride: 105 mmol/L (ref 98–111)
Creatinine, Ser: 1.31 mg/dL — ABNORMAL HIGH (ref 0.61–1.24)
GFR, Estimated: 56 mL/min — ABNORMAL LOW (ref >60)
Glucose, Bld: 97 mg/dL (ref 70–99)
Potassium: 4.1 mmol/L (ref 3.5–5.1)
Sodium: 136 mmol/L (ref 135–145)
Total Bilirubin: 0.4 mg/dL (ref 0.3–1.2)
Total Protein: 6.8 g/dL (ref 6.5–8.1)

## 2022-11-17 LAB — LIPASE, BLOOD: Lipase: 43 U/L (ref 11–51)

## 2022-11-17 NOTE — ED Notes (Signed)
Pt upset with wait stating he will go to [facility] in the morning

## 2022-11-17 NOTE — ED Triage Notes (Signed)
Difficulty voiding since yesterday he also has lt low er pain also.   Difficulty voiding until he arrived here tonight and he went to the br and had an explosion of urine but he still has the abd pain

## 2022-11-19 DIAGNOSIS — R3915 Urgency of urination: Secondary | ICD-10-CM | POA: Diagnosis not present

## 2022-11-19 DIAGNOSIS — R339 Retention of urine, unspecified: Secondary | ICD-10-CM | POA: Diagnosis not present

## 2022-11-19 DIAGNOSIS — R350 Frequency of micturition: Secondary | ICD-10-CM | POA: Diagnosis not present

## 2022-11-19 DIAGNOSIS — Z466 Encounter for fitting and adjustment of urinary device: Secondary | ICD-10-CM | POA: Diagnosis not present

## 2022-11-20 DIAGNOSIS — Z87898 Personal history of other specified conditions: Secondary | ICD-10-CM | POA: Diagnosis not present

## 2022-11-20 DIAGNOSIS — Z87448 Personal history of other diseases of urinary system: Secondary | ICD-10-CM | POA: Diagnosis not present

## 2022-11-20 DIAGNOSIS — T839XXA Unspecified complication of genitourinary prosthetic device, implant and graft, initial encounter: Secondary | ICD-10-CM | POA: Diagnosis not present

## 2022-11-21 DIAGNOSIS — H43811 Vitreous degeneration, right eye: Secondary | ICD-10-CM | POA: Diagnosis not present

## 2022-11-21 DIAGNOSIS — H02832 Dermatochalasis of right lower eyelid: Secondary | ICD-10-CM | POA: Diagnosis not present

## 2022-11-21 DIAGNOSIS — E119 Type 2 diabetes mellitus without complications: Secondary | ICD-10-CM | POA: Diagnosis not present

## 2022-11-21 DIAGNOSIS — H35363 Drusen (degenerative) of macula, bilateral: Secondary | ICD-10-CM | POA: Diagnosis not present

## 2022-11-22 DIAGNOSIS — R339 Retention of urine, unspecified: Secondary | ICD-10-CM | POA: Diagnosis not present

## 2022-11-22 DIAGNOSIS — N135 Crossing vessel and stricture of ureter without hydronephrosis: Secondary | ICD-10-CM | POA: Diagnosis not present

## 2022-11-26 DIAGNOSIS — L218 Other seborrheic dermatitis: Secondary | ICD-10-CM | POA: Diagnosis not present

## 2022-11-28 DIAGNOSIS — R002 Palpitations: Secondary | ICD-10-CM | POA: Diagnosis not present

## 2022-11-28 DIAGNOSIS — Z7689 Persons encountering health services in other specified circumstances: Secondary | ICD-10-CM | POA: Diagnosis not present

## 2022-11-28 DIAGNOSIS — I10 Essential (primary) hypertension: Secondary | ICD-10-CM | POA: Diagnosis not present

## 2022-11-28 DIAGNOSIS — E782 Mixed hyperlipidemia: Secondary | ICD-10-CM | POA: Diagnosis not present

## 2022-11-28 DIAGNOSIS — I2510 Atherosclerotic heart disease of native coronary artery without angina pectoris: Secondary | ICD-10-CM | POA: Diagnosis not present

## 2022-12-05 DIAGNOSIS — L218 Other seborrheic dermatitis: Secondary | ICD-10-CM | POA: Diagnosis not present

## 2022-12-05 DIAGNOSIS — L02821 Furuncle of head [any part, except face]: Secondary | ICD-10-CM | POA: Diagnosis not present

## 2022-12-11 DIAGNOSIS — Z1330 Encounter for screening examination for mental health and behavioral disorders, unspecified: Secondary | ICD-10-CM | POA: Diagnosis not present

## 2022-12-11 DIAGNOSIS — Z0000 Encounter for general adult medical examination without abnormal findings: Secondary | ICD-10-CM | POA: Diagnosis not present

## 2022-12-11 DIAGNOSIS — Z1331 Encounter for screening for depression: Secondary | ICD-10-CM | POA: Diagnosis not present

## 2022-12-11 DIAGNOSIS — E119 Type 2 diabetes mellitus without complications: Secondary | ICD-10-CM | POA: Diagnosis not present

## 2022-12-11 DIAGNOSIS — I10 Essential (primary) hypertension: Secondary | ICD-10-CM | POA: Diagnosis not present

## 2022-12-11 DIAGNOSIS — D649 Anemia, unspecified: Secondary | ICD-10-CM | POA: Diagnosis not present

## 2022-12-21 DIAGNOSIS — R339 Retention of urine, unspecified: Secondary | ICD-10-CM | POA: Diagnosis not present

## 2022-12-31 DIAGNOSIS — R5383 Other fatigue: Secondary | ICD-10-CM | POA: Diagnosis not present

## 2022-12-31 DIAGNOSIS — R2681 Unsteadiness on feet: Secondary | ICD-10-CM | POA: Diagnosis not present

## 2023-01-02 ENCOUNTER — Emergency Department (HOSPITAL_COMMUNITY)
Admission: EM | Admit: 2023-01-02 | Discharge: 2023-01-02 | Disposition: A | Discharge status: Home / Self Care | Payer: Medicare PPO | Attending: Emergency Medicine | Admitting: Emergency Medicine

## 2023-01-02 ENCOUNTER — Other Ambulatory Visit: Payer: Self-pay

## 2023-01-02 ENCOUNTER — Emergency Department (HOSPITAL_COMMUNITY): Payer: Medicare PPO

## 2023-01-02 DIAGNOSIS — I10 Essential (primary) hypertension: Secondary | ICD-10-CM | POA: Diagnosis not present

## 2023-01-02 DIAGNOSIS — Z1152 Encounter for screening for COVID-19: Secondary | ICD-10-CM | POA: Diagnosis not present

## 2023-01-02 DIAGNOSIS — R059 Cough, unspecified: Secondary | ICD-10-CM | POA: Diagnosis not present

## 2023-01-02 DIAGNOSIS — Z79899 Other long term (current) drug therapy: Secondary | ICD-10-CM | POA: Insufficient documentation

## 2023-01-02 DIAGNOSIS — E119 Type 2 diabetes mellitus without complications: Secondary | ICD-10-CM | POA: Insufficient documentation

## 2023-01-02 DIAGNOSIS — Z7984 Long term (current) use of oral hypoglycemic drugs: Secondary | ICD-10-CM | POA: Insufficient documentation

## 2023-01-02 DIAGNOSIS — I700 Atherosclerosis of aorta: Secondary | ICD-10-CM | POA: Diagnosis not present

## 2023-01-02 DIAGNOSIS — J40 Bronchitis, not specified as acute or chronic: Secondary | ICD-10-CM

## 2023-01-02 DIAGNOSIS — R918 Other nonspecific abnormal finding of lung field: Secondary | ICD-10-CM | POA: Diagnosis not present

## 2023-01-02 DIAGNOSIS — R531 Weakness: Secondary | ICD-10-CM | POA: Insufficient documentation

## 2023-01-02 DIAGNOSIS — R42 Dizziness and giddiness: Secondary | ICD-10-CM | POA: Diagnosis not present

## 2023-01-02 LAB — URINALYSIS, ROUTINE W REFLEX MICROSCOPIC
Bilirubin Urine: NEGATIVE
Glucose, UA: NEGATIVE mg/dL
Hgb urine dipstick: NEGATIVE
Ketones, ur: NEGATIVE mg/dL
Leukocytes,Ua: NEGATIVE
Nitrite: NEGATIVE
Protein, ur: 100 mg/dL — AB
Specific Gravity, Urine: 1.015 (ref 1.005–1.030)
pH: 5 (ref 5.0–8.0)

## 2023-01-02 LAB — COMPREHENSIVE METABOLIC PANEL
ALT: 23 U/L (ref 0–44)
AST: 26 U/L (ref 15–41)
Albumin: 3.7 g/dL (ref 3.5–5.0)
Alkaline Phosphatase: 72 U/L (ref 38–126)
Anion gap: 14 (ref 5–15)
BUN: 26 mg/dL — ABNORMAL HIGH (ref 8–23)
CO2: 20 mmol/L — ABNORMAL LOW (ref 22–32)
Calcium: 10.1 mg/dL (ref 8.9–10.3)
Chloride: 102 mmol/L (ref 98–111)
Creatinine, Ser: 1.36 mg/dL — ABNORMAL HIGH (ref 0.61–1.24)
GFR, Estimated: 53 mL/min — ABNORMAL LOW (ref >60)
Glucose, Bld: 117 mg/dL — ABNORMAL HIGH (ref 70–99)
Potassium: 4.6 mmol/L (ref 3.5–5.1)
Sodium: 136 mmol/L (ref 135–145)
Total Bilirubin: 0.5 mg/dL (ref <1.2)
Total Protein: 7.1 g/dL (ref 6.5–8.1)

## 2023-01-02 LAB — CBC WITH DIFFERENTIAL/PLATELET
Abs Immature Granulocytes: 0.06 10*3/uL (ref 0.00–0.07)
Basophils Absolute: 0.1 10*3/uL (ref 0.0–0.1)
Basophils Relative: 1 %
Eosinophils Absolute: 0.3 10*3/uL (ref 0.0–0.5)
Eosinophils Relative: 3 %
HCT: 33 % — ABNORMAL LOW (ref 39.0–52.0)
Hemoglobin: 10.3 g/dL — ABNORMAL LOW (ref 13.0–17.0)
Immature Granulocytes: 1 %
Lymphocytes Relative: 7 %
Lymphs Abs: 0.7 10*3/uL (ref 0.7–4.0)
MCH: 27 pg (ref 26.0–34.0)
MCHC: 31.2 g/dL (ref 30.0–36.0)
MCV: 86.6 fL (ref 80.0–100.0)
Monocytes Absolute: 1.1 10*3/uL — ABNORMAL HIGH (ref 0.1–1.0)
Monocytes Relative: 10 %
Neutro Abs: 8.2 10*3/uL — ABNORMAL HIGH (ref 1.7–7.7)
Neutrophils Relative %: 78 %
Platelets: 288 10*3/uL (ref 150–400)
RBC: 3.81 MIL/uL — ABNORMAL LOW (ref 4.22–5.81)
RDW: 14.3 % (ref 11.5–15.5)
WBC: 10.3 10*3/uL (ref 4.0–10.5)
nRBC: 0 % (ref 0.0–0.2)

## 2023-01-02 LAB — RESP PANEL BY RT-PCR (RSV, FLU A&B, COVID)  RVPGX2
Influenza A by PCR: NEGATIVE
Influenza B by PCR: NEGATIVE
Resp Syncytial Virus by PCR: NEGATIVE
SARS Coronavirus 2 by RT PCR: NEGATIVE

## 2023-01-02 LAB — CBG MONITORING, ED: Glucose-Capillary: 101 mg/dL — ABNORMAL HIGH (ref 70–99)

## 2023-01-02 LAB — TROPONIN I (HIGH SENSITIVITY): Troponin I (High Sensitivity): 5 ng/L (ref <18)

## 2023-01-02 LAB — MAGNESIUM: Magnesium: 2.1 mg/dL (ref 1.7–2.4)

## 2023-01-02 MED ORDER — AZITHROMYCIN 250 MG PO TABS: 250.0000 mg | ORAL_TABLET | Freq: Every day | ORAL | 0 refills | Status: AC

## 2023-01-02 MED ORDER — SODIUM CHLORIDE 0.9 % IV BOLUS
1000.0000 mL | Freq: Once | INTRAVENOUS | Status: AC
  Administered 2023-01-02: 1000 mL via INTRAVENOUS

## 2023-01-02 NOTE — ED Triage Notes (Signed)
Patient with generalized weakness since Saturday. Hx of vertigo and reports feeling similar to his normal episodes but worse in severity. Orthostatic hypotension- systolic dropped from 128 to 90 when he stood up with EMS. States that dizziness is worst in the morning. Multiple near syncopal episodes over the last few days.

## 2023-01-02 NOTE — ED Provider Notes (Signed)
Accepted handoff at shift change from [facility]. Please see prior provider note for more detail.   Briefly: Patient is 77 y.o.        DDX: concern for The differential diagnosis of weakness includes but is not limited to neurologic causes (GBS, myasthenia gravis, CVA, MS, ALS, transverse myelitis, spinal cord injury, CVA, botulism, ) and other causes: ACS, Arrhythmia, syncope, orthostatic hypotension, sepsis, hypoglycemia, electrolyte disturbance, hypothyroidism, respiratory failure, symptomatic anemia, dehydration, heat injury, polypharmacy, malignancy.    Plan: FU on urine - will give 1L NS, ambulate and reassess.       Physical Exam  BP 139/67   Pulse 81   Temp 99.1 F (37.3 C)   Resp (!) 23   Ht 5' 10.5" (1.791 m)   Wt 86.2 kg   SpO2 97%   BMI 26.88 kg/m   Physical Exam  Procedures  Procedures  ED Course / MDM    Medical Decision Making Amount and/or Complexity of Data Reviewed Labs: ordered. Radiology: ordered.  Risk Prescription drug management.   Patient is ambulate about difficulty in the hallway, independently without assistance.  SpO2 99% on ambulation vital signs normal.  Patient is overall well-appearing.  Urinalysis without evidence of infection.  Recommend close outpatient follow-up.  Return precautions discussed.  I agree with prior provider's evaluation and disposition. Pt understands need for close FU. I did provide azithromycin outpatient as patient states he does have a history of COPD and does have a sputum change with his cough however I think this is very conservative therapy.       Gailen Shelter, [place] 01/02/23 1923    Charlynne Pander, MD 01/02/23 [phone]

## 2023-01-02 NOTE — ED Notes (Signed)
Pt ambulated in the hallway. Pt able to ambulate independently without assistance. Pt agitated, wanting to leave, refusing to comply with monitoring equipment. Able to assess VS in room. HR 86 SPO2 99.

## 2023-01-02 NOTE — ED Notes (Signed)
Patient self caths at home, RN provided catheter kit to allow patient to cath himself for urine sample.

## 2023-01-02 NOTE — ED Provider Notes (Signed)
[place] EMERGENCY DEPARTMENT AT [facility] Provider Note   CSN: [id number] Arrival date & time: 01/02/23  1057     History  Chief Complaint  Patient presents with   Weakness    Dwayne Vargas is a 77 y.o. male with history of type 2 diabetes, hypertension, bipolar 1, presents with concern for feeling more weak over the last 3 to 4 days.  States his arms and legs feel like rubber.  He reports a history of vertigo, but states this does not seem similar to the vertigo. Denies any syncopal episodes or pre-syncope. He also reports a productive cough with yellow phlegm ongoing for the past month or so.  Denies any fever or chills, nausea, vomiting, or diarrhea.  Denies any chest pain, shortness of breath.   Weakness      Home Medications Prior to Admission medications   Medication Sig Start Date End Date Taking? Authorizing Provider  amLODipine (NORVASC) 10 MG tablet Take 1 tablet by mouth daily. 11/28/22 11/28/23 Yes [provider]  minocycline (MINOCIN) 100 MG capsule Take 100 mg by mouth 2 (two) times daily. 12/05/22  Yes [provider]  sulfamethoxazole-trimethoprim (BACTRIM DS) 800-160 MG tablet Take 1 tablet by mouth 2 (two) times daily. 08/10/22  Yes [provider]  amLODipine (NORVASC) 5 MG tablet Take 5 mg by mouth daily.    [provider]  Bacillus Coagulans-Inulin (PROBIOTIC) 1-250 BILLION-MG CAPS Take 1 tablet by mouth daily.    [provider]  calcium carbonate (OS-CAL) 1250 (500 Ca) MG chewable tablet Chew 1 tablet by mouth 2 (two) times daily.    [provider]  clobetasol (TEMOVATE) 0.05 % external solution Apply 1 Application topically 2 (two) times daily as needed (flares). 12/05/22   [provider]  empagliflozin (JARDIANCE) 25 MG TABS tablet Take 25 mg by mouth daily.    [provider]  famotidine (PEPCID) 20 MG tablet Take 20 mg by mouth at bedtime. 01/15/22   [provider]  lamoTRIgine (LAMICTAL) 200 MG tablet Take 200 mg by mouth daily.    [provider]  metFORMIN (GLUCOPHAGE) 500 MG tablet Take 500 mg by mouth 2 (two) times daily. 03/07/22   [provider]  pantoprazole (PROTONIX) 40 MG tablet Take 1 tablet (40 mg total) by mouth 2 (two) times daily for 26 days. 10/29/22 11/24/22  Lovie Macadamia, MD  pantoprazole (PROTONIX) 40 MG tablet Take 1 tablet (40 mg total) by mouth daily. 11/24/22 11/24/23  Lovie Macadamia, MD  polyethylene glycol (MIRALAX / GLYCOLAX) 17 g packet Take 17 g by mouth daily. 11/01/22   Tegeler, Canary Brim, MD  rosuvastatin (CRESTOR) 10 MG tablet Take 1 tablet (10 mg total) by mouth daily. 01/05/22   Carlos Levering, NP  sucralfate (CARAFATE) 1 GM/10ML suspension Take 10 mLs (1 g total) by mouth 4 (four) times daily -  with meals and at bedtime. 10/29/22   Lovie Macadamia, MD  valsartan (DIOVAN) 320 MG tablet Take 320 mg by mouth daily.    [provider]      Allergies    Aspirin, Contrast media [iodinated contrast media], Fentanyl, Morphine and codeine, Oxycontin [oxycodone hcl], Shellfish allergy, and Diazepam    Review of Systems   Review of Systems  Neurological:  Positive for weakness.    Physical Exam Updated Vital Signs BP 134/74 (BP Location: Right Arm)   Pulse 83   Temp 98.2 F (36.8 C) (Oral)   Resp (!) 21  Ht 5' 10.5" (1.791 m)   Wt 86.2 kg   SpO2 100%   BMI 26.88 kg/m  Physical Exam Vitals and nursing note reviewed.  Constitutional:      General: He is not in acute distress.    Appearance: He is well-developed.     Comments: Well-appearing, no acute distress.  Moves around comfortably in the bed  HENT:     Head: Normocephalic and atraumatic.  Eyes:     Conjunctiva/sclera: Conjunctivae normal.  Cardiovascular:     Rate and Rhythm: Normal rate and regular rhythm.     Heart sounds: No murmur heard.    Comments: Radial pulse 2+ bilaterally Pulmonary:     Effort:  Pulmonary effort is normal. No respiratory distress.     Breath sounds: Normal breath sounds.  Abdominal:     Palpations: Abdomen is soft.     Tenderness: There is no abdominal tenderness.  Musculoskeletal:        General: No swelling.     Cervical back: Neck supple.  Skin:    General: Skin is warm and dry.     Capillary Refill: Capillary refill takes less than 2 seconds.  Neurological:     General: No focal deficit present.     Mental Status: He is alert and oriented to person, place, and time.     Motor: No weakness.  Psychiatric:        Mood and Affect: Mood normal.     ED Results / Procedures / Treatments   Labs (all labs ordered are listed, but only abnormal results are displayed) Labs Reviewed  CBC WITH DIFFERENTIAL/PLATELET - Abnormal; Notable for the following components:      Result Value   RBC 3.81 (*)    Hemoglobin 10.3 (*)    HCT 33.0 (*)    Neutro Abs 8.2 (*)    Monocytes Absolute 1.1 (*)    All other components within normal limits  COMPREHENSIVE METABOLIC PANEL - Abnormal; Notable for the following components:   CO2 20 (*)    Glucose, Bld 117 (*)    BUN 26 (*)    Creatinine, Ser 1.36 (*)    GFR, Estimated 53 (*)    All other components within normal limits  CBG MONITORING, ED - Abnormal; Notable for the following components:   Glucose-Capillary 101 (*)    All other components within normal limits  RESP PANEL BY RT-PCR (RSV, FLU A&B, COVID)  [id number]  MAGNESIUM  URINALYSIS, ROUTINE W REFLEX MICROSCOPIC  TROPONIN I (HIGH SENSITIVITY)    EKG EKG Interpretation Date/Time:  Wednesday January 02 2023 11:04:53 EST Ventricular Rate:  83 PR Interval:  167 QRS Duration:  99 QT Interval:  349 QTC Calculation: 410 R Axis:   37  Text Interpretation: Sinus rhythm Abnormal R-wave progression, early transition Confirmed by Vivi Barrack [phone]) on 01/02/2023 11:41:21 AM  Radiology DG Chest 2 View  Result Date: 01/02/2023 CLINICAL DATA:  Cough for 1 month.  EXAM: CHEST - 2 VIEW COMPARISON:  11/01/2022 FINDINGS: Heart size and mediastinal contours are normal. Aortic atherosclerotic calcifications. No pleural effusion or airspace consolidation. Unchanged appearance of chronic coarsened interstitial markings throughout both lungs. Persistent blunting of the left costophrenic angle may represent a small effusion or pleuroparenchymal scarring. Mild curvature of the thoracic spine is convex towards the right. Multilevel thoracic degenerative disc disease noted. IMPRESSION: 1. No acute findings. 2. Chronic coarsened interstitial markings throughout both lungs. 3. Persistent blunting of the left costophrenic angle may represent a  small effusion or pleuroparenchymal scarring. Electronically Signed   By: Signa Kell M.D.   On: 01/02/2023 13:22    Procedures Procedures    Medications Ordered in ED Medications  sodium chloride 0.9 % bolus 1,000 mL (has no administration in time range)    ED Course/ Medical Decision Making/ A&P                                 Medical Decision Making Amount and/or Complexity of Data Reviewed Labs: ordered. Radiology: ordered.   Differential diagnosis includes but is not limited to electrolyte abnormality, ACS, arrhythmia, covid, flu, pneumonia, UTI  ED Course:  Patient overall well-appearing, no acute distress.  Vital signs stable.  CBC at patient baseline, CMP with elevated creatinine today at 1.36, however, this appears to be at patient baseline as creatinine 1 month ago was 1.31.  Magnesium within normal limits.  CBG at 101.  Troponin at 5.  Given his symptoms have been ongoing for multiple days, no acute worsening, no chest pain, no shortness of breath,  EKG with NSR, low suspicion for ACS at this time.  His COVID, flu, RSV is negative.  Chest x-ray without any acute abnormalities.  Low concern for pneumonia at this time.  Pending urinalysis to rule out UTI.  Impression: Feelings of generalized  weakness  Disposition:  Care of this patient signed out to oncoming ED provider Solon Augusta, PA-C. Disposition and treatment plan pending imaging results and clinical judgment of oncoming ED team.    Lab Tests: I Ordered, and personally interpreted labs.  The pertinent results include:   CBC without leukocytosis, hemoglobin improved from prior labs CMP with elevated creatinine at 1.36, seems consistent with baseline Magnesium 2.1 CBG 101 COVID, flu, RSV negative  Imaging Studies ordered: I ordered imaging studies including chest x-ray I independently visualized the imaging with scope of interpretation limited to determining acute life threatening conditions related to emergency care. Imaging showed  no acute abnormalities although patient does have chronic changes of lung interstitial markings and blunting of left costophrenic angle concerning for small pleural effusion or pleuroparenchymal scarring.  I agree with the radiologist interpretation   Cardiac Monitoring: / EKG: The patient was maintained on a cardiac monitor.  I personally viewed and interpreted the cardiac monitored which showed an underlying rhythm of: Normal sinus rhythm               Final Clinical Impression(s) / ED Diagnoses Final diagnoses:  None    Rx / DC Orders ED Discharge Orders     None         Arabella Merles, PA-C 01/02/23 1528    Loetta Rough, MD 01/07/23 1229

## 2023-01-02 NOTE — Discharge Instructions (Addendum)
Your workup is reassuring today.  Your electrolytes are all normal.  Your hemoglobin is low, but improved from where you were last couple months.  Your creatinine  (the marker of your kidney function) is higher than it should be today.  This is usually a sign of dehydration.  You were given some fluids to help with this.  Please keep well-hydrated at home. You likely have bronchitis which is causing your cough. Please follow-up with your PCP to repeat your creatinine lab value within the next week and to follow up on your symptoms.    Your urine did not show any signs of infection.  The cardiac enzyme today is normal.  This would not normally be elevated in the setting of a heart attack. Your EKG which is your heart's electrical activity is normal.  Your COVID, flu, RSV is negative.  Your chest x-ray did not show any signs of pneumonia. Given the sputum you are producing I am going to treat you with azithromycin. An antibiotic that also helps with inflammation.   Return to the ER for any chest pain, shortness of breath, loss of consciousness, any other new or concerning symptoms.

## 2023-01-15 DIAGNOSIS — R3914 Feeling of incomplete bladder emptying: Secondary | ICD-10-CM | POA: Diagnosis not present

## 2023-01-15 DIAGNOSIS — N528 Other male erectile dysfunction: Secondary | ICD-10-CM | POA: Diagnosis not present

## 2023-01-15 DIAGNOSIS — N35914 Unspecified anterior urethral stricture, male: Secondary | ICD-10-CM | POA: Diagnosis not present

## 2023-01-15 DIAGNOSIS — N281 Cyst of kidney, acquired: Secondary | ICD-10-CM | POA: Diagnosis not present

## 2023-01-15 DIAGNOSIS — N320 Bladder-neck obstruction: Secondary | ICD-10-CM | POA: Diagnosis not present

## 2023-01-15 DIAGNOSIS — N209 Urinary calculus, unspecified: Secondary | ICD-10-CM | POA: Diagnosis not present

## 2023-01-15 DIAGNOSIS — N401 Enlarged prostate with lower urinary tract symptoms: Secondary | ICD-10-CM | POA: Diagnosis not present

## 2023-01-15 DIAGNOSIS — Z8549 Personal history of malignant neoplasm of other male genital organs: Secondary | ICD-10-CM | POA: Diagnosis not present

## 2023-01-21 DIAGNOSIS — R339 Retention of urine, unspecified: Secondary | ICD-10-CM | POA: Diagnosis not present

## 2023-04-19 ENCOUNTER — Emergency Department (HOSPITAL_BASED_OUTPATIENT_CLINIC_OR_DEPARTMENT_OTHER)
Admission: EM | Admit: 2023-04-19 | Discharge: 2023-04-19 | Disposition: A | Discharge status: Home / Self Care | Payer: Medicare PPO | Attending: Emergency Medicine | Admitting: Emergency Medicine

## 2023-04-19 ENCOUNTER — Encounter (HOSPITAL_BASED_OUTPATIENT_CLINIC_OR_DEPARTMENT_OTHER): Payer: Self-pay | Admitting: Emergency Medicine

## 2023-04-19 ENCOUNTER — Other Ambulatory Visit: Payer: Self-pay

## 2023-04-19 ENCOUNTER — Emergency Department (HOSPITAL_BASED_OUTPATIENT_CLINIC_OR_DEPARTMENT_OTHER): Payer: Medicare PPO

## 2023-04-19 DIAGNOSIS — N390 Urinary tract infection, site not specified: Secondary | ICD-10-CM | POA: Diagnosis present

## 2023-04-19 DIAGNOSIS — N200 Calculus of kidney: Secondary | ICD-10-CM | POA: Diagnosis not present

## 2023-04-19 DIAGNOSIS — B965 Pseudomonas (aeruginosa) (mallei) (pseudomallei) as the cause of diseases classified elsewhere: Secondary | ICD-10-CM | POA: Insufficient documentation

## 2023-04-19 LAB — CBC WITH DIFFERENTIAL/PLATELET
Abs Immature Granulocytes: 0.05 10*3/uL (ref 0.00–0.07)
Basophils Absolute: 0.1 10*3/uL (ref 0.0–0.1)
Basophils Relative: 1 %
Eosinophils Absolute: 0.3 10*3/uL (ref 0.0–0.5)
Eosinophils Relative: 3 %
HCT: 31.1 % — ABNORMAL LOW (ref 39.0–52.0)
Hemoglobin: 10 g/dL — ABNORMAL LOW (ref 13.0–17.0)
Immature Granulocytes: 1 %
Lymphocytes Relative: 23 %
Lymphs Abs: 2 10*3/uL (ref 0.7–4.0)
MCH: 28.5 pg (ref 26.0–34.0)
MCHC: 32.2 g/dL (ref 30.0–36.0)
MCV: 88.6 fL (ref 80.0–100.0)
Monocytes Absolute: 0.6 10*3/uL (ref 0.1–1.0)
Monocytes Relative: 7 %
Neutro Abs: 5.7 10*3/uL (ref 1.7–7.7)
Neutrophils Relative %: 65 %
Platelets: 286 10*3/uL (ref 150–400)
RBC: 3.51 MIL/uL — ABNORMAL LOW (ref 4.22–5.81)
RDW: 16.2 % — ABNORMAL HIGH (ref 11.5–15.5)
WBC: 8.7 10*3/uL (ref 4.0–10.5)
nRBC: 0 % (ref 0.0–0.2)

## 2023-04-19 LAB — BASIC METABOLIC PANEL
Anion gap: 8 (ref 5–15)
BUN: 27 mg/dL — ABNORMAL HIGH (ref 8–23)
CO2: 23 mmol/L (ref 22–32)
Calcium: 9.9 mg/dL (ref 8.9–10.3)
Chloride: 107 mmol/L (ref 98–111)
Creatinine, Ser: 1.49 mg/dL — ABNORMAL HIGH (ref 0.61–1.24)
GFR, Estimated: 48 mL/min — ABNORMAL LOW (ref >60)
Glucose, Bld: 125 mg/dL — ABNORMAL HIGH (ref 70–99)
Potassium: 4.7 mmol/L (ref 3.5–5.1)
Sodium: 138 mmol/L (ref 135–145)

## 2023-04-19 MED ORDER — LEVOFLOXACIN 750 MG PO TABS
750.0000 mg | ORAL_TABLET | Freq: Once | ORAL | Status: AC
  Administered 2023-04-19: 750 mg via ORAL
  Filled 2023-04-19: qty 1

## 2023-04-19 MED ORDER — LEVOFLOXACIN 750 MG PO TABS
750.0000 mg | ORAL_TABLET | Freq: Every day | ORAL | 0 refills | Status: AC
Start: 2023-04-19 — End: 2023-04-25

## 2023-04-19 NOTE — ED Triage Notes (Signed)
Pt caox4 c/o painful urination, dark urine, and increased urinary frequency x1 wk, pt states he was seen 2/18 for same and was called and told to go to ED for IV abx. Urine culture + for Pseudomonas aeruginosa.

## 2023-04-19 NOTE — ED Provider Notes (Signed)
[place] EMERGENCY DEPARTMENT AT [facility] Provider Note   CSN: [id number] Arrival date & time: 04/19/23  1124     History  Chief Complaint  Patient presents with   Positive Urine Culture    Dwayne Vargas is a 78 y.o. male.  78 year old male is here today after he had positive urine cultures which grew Pseudomonas.  Patient been having urinary symptoms last few days.  No fever, no chills.        Home Medications Prior to Admission medications   Medication Sig Start Date End Date Taking? Authorizing Provider  levofloxacin (LEVAQUIN) 750 MG tablet Take 1 tablet (750 mg total) by mouth daily for 6 days. 04/19/23 04/25/23 Yes Anders Simmonds T, DO  amLODipine (NORVASC) 10 MG tablet Take 1 tablet by mouth in the morning. 11/28/22 11/28/23  [provider]  azithromycin (ZITHROMAX) 250 MG tablet Take 1 tablet (250 mg total) by mouth daily. Take first 2 tablets together, then 1 every day until finished. 01/02/23   Gailen Shelter, PA  Bacillus Coagulans-Inulin (PROBIOTIC) 1-250 BILLION-MG CAPS Take 1 tablet by mouth daily.    [provider]  Cyanocobalamin (VITAMIN B-12 PO) Take 1 tablet by mouth in the morning.    [provider]  lamoTRIgine (LAMICTAL) 200 MG tablet Take 200 mg by mouth every evening.    [provider]  metFORMIN (GLUCOPHAGE) 500 MG tablet Take 500 mg by mouth 2 (two) times daily. 03/07/22   [provider]  polyethylene glycol (MIRALAX / GLYCOLAX) 17 g packet Take 17 g by mouth daily. Patient taking differently: Take 17 g by mouth daily as needed for mild constipation. 11/01/22   Tegeler, Canary Brim, MD  rosuvastatin (CRESTOR) 10 MG tablet Take 1 tablet (10 mg total) by mouth daily. Patient taking differently: Take 10 mg by mouth every evening. 01/05/22   Carlos Levering, NP  valsartan (DIOVAN) 320 MG tablet Take 320 mg by mouth in the morning.    [provider]      Allergies    Aspirin,  Contrast media [iodinated contrast media], Fentanyl, Morphine and codeine, Oxycontin [oxycodone hcl], Shellfish allergy, and Diazepam    Review of Systems   Review of Systems  Physical Exam Updated Vital Signs BP (!) 147/50 (BP Location: Left Arm)   Pulse 71   Temp 98.1 F (36.7 C) (Oral)   Resp 20   Ht 5' 10.5" (1.791 m)   Wt 90.7 kg   SpO2 100%   BMI 28.29 kg/m  Physical Exam Vitals reviewed.  Constitutional:      Appearance: He is not toxic-appearing.  Cardiovascular:     Rate and Rhythm: Normal rate.  Pulmonary:     Effort: Pulmonary effort is normal.     ED Results / Procedures / Treatments   Labs (all labs ordered are listed, but only abnormal results are displayed) Labs Reviewed  CBC WITH DIFFERENTIAL/PLATELET - Abnormal; Notable for the following components:      Result Value   RBC 3.51 (*)    Hemoglobin 10.0 (*)    HCT 31.1 (*)    RDW 16.2 (*)    All other components within normal limits  BASIC METABOLIC PANEL - Abnormal; Notable for the following components:   Glucose, Bld 125 (*)    BUN 27 (*)    Creatinine, Ser 1.49 (*)    GFR, Estimated 48 (*)    All other components within normal limits  URINE CULTURE  URINALYSIS, W/ REFLEX TO  CULTURE (INFECTION SUSPECTED)    EKG None  Radiology CT ABDOMEN PELVIS WO CONTRAST Result Date: 04/19/2023 CLINICAL DATA:  Dysuria and dark urine.  Nephrolithiasis. EXAM: CT ABDOMEN AND PELVIS WITHOUT CONTRAST TECHNIQUE: Multidetector CT imaging of the abdomen and pelvis was performed following the standard protocol without IV contrast. RADIATION DOSE REDUCTION: This exam was performed according to the departmental dose-optimization program which includes automated exposure control, adjustment of the mA and/or kV according to patient size and/or use of iterative reconstruction technique. COMPARISON:  11/01/2022 FINDINGS: Lower chest: No acute findings. Hepatobiliary: No mass visualized on this unenhanced exam. Prior  cholecystectomy. No evidence of biliary obstruction. Pancreas: No mass or inflammatory process visualized on this unenhanced exam. Spleen:  Within normal limits in size. Adrenals/Urinary tract: 2 mm calculus noted in lower pole of left kidney. No evidence of urolithiasis or hydronephrosis. Small renal cysts show no significant change compared to prior exam. Unremarkable unopacified urinary bladder. Stomach/Bowel: No evidence of obstruction, inflammatory process, or abnormal fluid collections. Vascular/Lymphatic: No pathologically enlarged lymph nodes identified. No evidence of abdominal aortic aneurysm. Reproductive: Prior TURP defect again noted. Penile prosthesis again seen. Other:  None. Musculoskeletal: No suspicious bone lesions identified. Lumbosacral spine fusion hardware and right hip prosthesis again noted. IMPRESSION: Tiny left renal calculus. No evidence of urolithiasis, hydronephrosis, or other acute findings. Electronically Signed   By: Danae Orleans M.D.   On: 04/19/2023 13:38    Procedures Procedures    Medications Ordered in ED Medications  levofloxacin (LEVAQUIN) tablet 750 mg (750 mg Oral Given 04/19/23 1320)    ED Course/ Medical Decision Making/ A&P                                 Medical Decision Making 78 year old male here today with UTI symptoms, positive culture for Pseudomonas.  Plan- on exam, patient overall looks well.  He is nontoxic-appearing.  Patient tells me that his urine culture obtained at an outpatient urgent care.  I was able to review these urine culture results.  They are indeed positive for Pseudomonas.  Patient is adamant he does not want to stay in the emergency room, tell me that he has an important appointment tomorrow due to having "a screw loose "in his back.  Did reach out to infectious diseases, Dr. Drue Second, who said it was reasonable to try the patient on an oral fluoroquinolone, but if that does not work the patient will require IV antibiotics and  a PICC line.  Did obtain CT imaging of the patient's abdomen to rule out new obstructive process which could be contributing to his symptoms.  Negative aside from renal calculus which has not obstructive, and likely not contributing to his symptoms.  Will discharge, return precautions discussed extensively with patient at bedside.    Amount and/or Complexity of Data Reviewed Labs: ordered. Radiology: ordered.  Risk Prescription drug management.           Final Clinical Impression(s) / ED Diagnoses Final diagnoses:  Pseudomonas urinary tract infection    Rx / DC Orders ED Discharge Orders          Ordered    levofloxacin (LEVAQUIN) 750 MG tablet  Daily        04/19/23 1401              Anders Simmonds T, DO 04/19/23 1402

## 2023-04-19 NOTE — Discharge Instructions (Addendum)
You are being treated for urinary tract infection with Levaquin.  This is the only oral medication that we will work on the bacteria that you are currently being treated for.  If this antibiotic does not work, you will require IV antibiotics.  You should give the antibiotics 2 to 3 days to see if you have an improvement in your symptoms.  If you develop fever, chills feels that you are getting worse, I want you to come back to the emergency room immediately.  In the meantime, you can take the Levaquin daily for the next 6 days.  This medication does have some rare side effects which include injury to the tendons in your body.  While you are taking his medications, do not perform any strenuous activities or exercises.
